# Patient Record
Sex: Female | Born: 1990 | Race: White | Hispanic: No | Marital: Married | Smoking: Former smoker
Health system: Southern US, Community
[De-identification: ages and names within clinical notes are randomized; demographics above are authoritative.]

## PROBLEM LIST (undated history)

## (undated) ENCOUNTER — Inpatient Hospital Stay: Payer: Self-pay

## (undated) DIAGNOSIS — F329 Major depressive disorder, single episode, unspecified: Secondary | ICD-10-CM

## (undated) DIAGNOSIS — F988 Other specified behavioral and emotional disorders with onset usually occurring in childhood and adolescence: Secondary | ICD-10-CM

## (undated) DIAGNOSIS — R7303 Prediabetes: Secondary | ICD-10-CM

## (undated) DIAGNOSIS — R112 Nausea with vomiting, unspecified: Secondary | ICD-10-CM

## (undated) DIAGNOSIS — F419 Anxiety disorder, unspecified: Secondary | ICD-10-CM

## (undated) DIAGNOSIS — J069 Acute upper respiratory infection, unspecified: Secondary | ICD-10-CM

## (undated) DIAGNOSIS — K911 Postgastric surgery syndromes: Secondary | ICD-10-CM

## (undated) DIAGNOSIS — K219 Gastro-esophageal reflux disease without esophagitis: Secondary | ICD-10-CM

## (undated) DIAGNOSIS — J45909 Unspecified asthma, uncomplicated: Secondary | ICD-10-CM

## (undated) DIAGNOSIS — F319 Bipolar disorder, unspecified: Secondary | ICD-10-CM

## (undated) DIAGNOSIS — F99 Mental disorder, not otherwise specified: Secondary | ICD-10-CM

## (undated) DIAGNOSIS — F32A Depression, unspecified: Secondary | ICD-10-CM

## (undated) DIAGNOSIS — Z9889 Other specified postprocedural states: Secondary | ICD-10-CM

## (undated) DIAGNOSIS — L309 Dermatitis, unspecified: Secondary | ICD-10-CM

## (undated) HISTORY — DX: Acute upper respiratory infection, unspecified: J06.9

## (undated) HISTORY — PX: SINUS SURGERY WITH INSTATRAK: SHX5215

## (undated) HISTORY — PX: SINOSCOPY: SHX187

## (undated) HISTORY — DX: Dermatitis, unspecified: L30.9

## (undated) HISTORY — PX: WISDOM TOOTH EXTRACTION: SHX21

---
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
## 2013-07-06 ENCOUNTER — Emergency Department: Payer: Self-pay | Admitting: Emergency Medicine

## 2013-07-06 LAB — DRUG SCREEN, URINE
Amphetamines, Ur Screen: NEGATIVE (ref ?–1000)
Barbiturates, Ur Screen: POSITIVE (ref ?–200)
Cocaine Metabolite,Ur ~~LOC~~: NEGATIVE (ref ?–300)
Methadone, Ur Screen: NEGATIVE (ref ?–300)
Opiate, Ur Screen: NEGATIVE (ref ?–300)
Tricyclic, Ur Screen: NEGATIVE (ref ?–1000)

## 2013-07-06 LAB — COMPREHENSIVE METABOLIC PANEL
Alkaline Phosphatase: 89 U/L (ref 50–136)
Anion Gap: 4 — ABNORMAL LOW (ref 7–16)
Bilirubin,Total: 0.2 mg/dL (ref 0.2–1.0)
Calcium, Total: 8.5 mg/dL (ref 8.5–10.1)
Chloride: 111 mmol/L — ABNORMAL HIGH (ref 98–107)
Creatinine: 1.17 mg/dL (ref 0.60–1.30)
EGFR (African American): >60
Glucose: 95 mg/dL (ref 65–99)
Osmolality: 282 (ref 275–301)
SGOT(AST): 20 U/L (ref 15–37)
SGPT (ALT): 21 U/L (ref 12–78)
Sodium: 141 mmol/L (ref 136–145)
Total Protein: 7.6 g/dL (ref 6.4–8.2)

## 2013-07-06 LAB — URINALYSIS, COMPLETE
Bacteria: NONE SEEN
Ketone: NEGATIVE
Protein: NEGATIVE
RBC,UR: 2 /HPF (ref 0–5)
Specific Gravity: 1.012 (ref 1.003–1.030)
Squamous Epithelial: 7

## 2013-07-06 LAB — CBC
MCHC: 32.3 g/dL (ref 32.0–36.0)
Platelet: 288 10*3/uL (ref 150–440)
RBC: 4.71 10*6/uL (ref 3.80–5.20)
WBC: 7.1 10*3/uL (ref 3.6–11.0)

## 2013-08-10 ENCOUNTER — Emergency Department: Payer: Self-pay | Admitting: Emergency Medicine

## 2013-08-10 LAB — BASIC METABOLIC PANEL
Anion Gap: 5 — ABNORMAL LOW (ref 7–16)
BUN: 8 mg/dL (ref 7–18)
Calcium, Total: 8.7 mg/dL (ref 8.5–10.1)
Chloride: 106 mmol/L (ref 98–107)
Co2: 28 mmol/L (ref 21–32)
Creatinine: 0.91 mg/dL (ref 0.60–1.30)
EGFR (Non-African Amer.): >60
Glucose: 113 mg/dL — ABNORMAL HIGH (ref 65–99)
Osmolality: 277 (ref 275–301)
Potassium: 3.3 mmol/L — ABNORMAL LOW (ref 3.5–5.1)
Sodium: 139 mmol/L (ref 136–145)

## 2013-08-10 LAB — CBC
HCT: 40.9 % (ref 35.0–47.0)
MCH: 27.7 pg (ref 26.0–34.0)
MCHC: 32.9 g/dL (ref 32.0–36.0)
Platelet: 215 10*3/uL (ref 150–440)
RBC: 4.87 10*6/uL (ref 3.80–5.20)
RDW: 16 % — ABNORMAL HIGH (ref 11.5–14.5)
WBC: 9.4 10*3/uL (ref 3.6–11.0)

## 2013-08-12 ENCOUNTER — Emergency Department: Payer: Self-pay | Admitting: Emergency Medicine

## 2013-08-19 ENCOUNTER — Emergency Department: Payer: Self-pay | Admitting: Emergency Medicine

## 2013-08-19 LAB — COMPREHENSIVE METABOLIC PANEL
Albumin: 3.5 g/dL (ref 3.4–5.0)
Alkaline Phosphatase: 90 U/L (ref 50–136)
Anion Gap: 3 — ABNORMAL LOW (ref 7–16)
BUN: 12 mg/dL (ref 7–18)
Bilirubin,Total: 0.2 mg/dL (ref 0.2–1.0)
Calcium, Total: 8.1 mg/dL — ABNORMAL LOW (ref 8.5–10.1)
Chloride: 107 mmol/L (ref 98–107)
Co2: 29 mmol/L (ref 21–32)
Creatinine: 1.14 mg/dL (ref 0.60–1.30)
EGFR (African American): >60
Glucose: 94 mg/dL (ref 65–99)
Potassium: 3.6 mmol/L (ref 3.5–5.1)
SGOT(AST): 24 U/L (ref 15–37)
Total Protein: 7.2 g/dL (ref 6.4–8.2)

## 2013-08-19 LAB — URINALYSIS, COMPLETE
Bacteria: NONE SEEN
Bilirubin,UR: NEGATIVE
Glucose,UR: NEGATIVE mg/dL (ref 0–75)
Ketone: NEGATIVE
Protein: NEGATIVE
RBC,UR: 1 /HPF (ref 0–5)
Specific Gravity: 1.019 (ref 1.003–1.030)
Squamous Epithelial: 2
WBC UR: 5 /HPF (ref 0–5)

## 2013-08-19 LAB — CBC
HCT: 38.7 % (ref 35.0–47.0)
HGB: 12.9 g/dL (ref 12.0–16.0)
MCH: 28.1 pg (ref 26.0–34.0)
MCHC: 33.3 g/dL (ref 32.0–36.0)
RDW: 15.7 % — ABNORMAL HIGH (ref 11.5–14.5)

## 2013-08-22 ENCOUNTER — Inpatient Hospital Stay: Payer: Self-pay | Admitting: Psychiatry

## 2013-08-22 LAB — PREGNANCY, URINE: Pregnancy Test, Urine: NEGATIVE m[IU]/mL

## 2013-08-22 LAB — URINALYSIS, COMPLETE
Bilirubin,UR: NEGATIVE
Blood: NEGATIVE
Glucose,UR: NEGATIVE mg/dL (ref 0–75)
Ketone: NEGATIVE
Nitrite: NEGATIVE
Protein: 30
RBC,UR: 1 /HPF (ref 0–5)
Specific Gravity: 1.01 (ref 1.003–1.030)
Squamous Epithelial: 8
WBC UR: 27 /HPF (ref 0–5)

## 2013-08-22 LAB — CBC
HCT: 39.4 % (ref 35.0–47.0)
MCHC: 32.7 g/dL (ref 32.0–36.0)
MCV: 86 fL (ref 80–100)
Platelet: 261 10*3/uL (ref 150–440)
RBC: 4.58 10*6/uL (ref 3.80–5.20)
RDW: 15.5 % — ABNORMAL HIGH (ref 11.5–14.5)
WBC: 9.6 10*3/uL (ref 3.6–11.0)

## 2013-08-22 LAB — COMPREHENSIVE METABOLIC PANEL
Albumin: 3.5 g/dL (ref 3.4–5.0)
Alkaline Phosphatase: 87 U/L (ref 50–136)
Anion Gap: 9 (ref 7–16)
BUN: 9 mg/dL (ref 7–18)
Calcium, Total: 8.9 mg/dL (ref 8.5–10.1)
Co2: 27 mmol/L (ref 21–32)
Creatinine: 1.24 mg/dL (ref 0.60–1.30)
EGFR (African American): >60
Potassium: 3.4 mmol/L — ABNORMAL LOW (ref 3.5–5.1)
SGPT (ALT): 34 U/L (ref 12–78)
Sodium: 144 mmol/L (ref 136–145)
Total Protein: 7.1 g/dL (ref 6.4–8.2)

## 2013-08-22 LAB — DRUG SCREEN, URINE
Barbiturates, Ur Screen: NEGATIVE (ref ?–200)
Benzodiazepine, Ur Scrn: NEGATIVE (ref ?–200)
Cannabinoid 50 Ng, Ur ~~LOC~~: NEGATIVE (ref ?–50)
Cocaine Metabolite,Ur ~~LOC~~: NEGATIVE (ref ?–300)
Methadone, Ur Screen: NEGATIVE (ref ?–300)
Opiate, Ur Screen: NEGATIVE (ref ?–300)
Phencyclidine (PCP) Ur S: NEGATIVE (ref ?–25)

## 2013-08-22 LAB — ETHANOL: Ethanol: <3 mg/dL

## 2013-10-11 ENCOUNTER — Emergency Department: Payer: Self-pay | Admitting: Emergency Medicine

## 2013-11-12 ENCOUNTER — Emergency Department: Payer: Self-pay | Admitting: Emergency Medicine

## 2013-11-12 LAB — URINALYSIS, COMPLETE
Bacteria: NONE SEEN
Bilirubin,UR: NEGATIVE
Blood: NEGATIVE
Glucose,UR: NEGATIVE mg/dL (ref 0–75)
Nitrite: NEGATIVE
Ph: 8 (ref 4.5–8.0)
Specific Gravity: 1.006 (ref 1.003–1.030)

## 2013-11-12 LAB — RAPID INFLUENZA A&B ANTIGENS

## 2013-11-25 ENCOUNTER — Emergency Department: Payer: Self-pay | Admitting: Internal Medicine

## 2013-11-25 LAB — CBC
MCH: 28.6 pg (ref 26.0–34.0)
MCHC: 33.4 g/dL (ref 32.0–36.0)
MCV: 86 fL (ref 80–100)
RBC: 5.34 10*6/uL — ABNORMAL HIGH (ref 3.80–5.20)
WBC: 11.4 10*3/uL — ABNORMAL HIGH (ref 3.6–11.0)

## 2013-11-25 LAB — BASIC METABOLIC PANEL
Anion Gap: 5 — ABNORMAL LOW (ref 7–16)
BUN: 12 mg/dL (ref 7–18)
Calcium, Total: 9.7 mg/dL (ref 8.5–10.1)
Co2: 28 mmol/L (ref 21–32)
Creatinine: 0.85 mg/dL (ref 0.60–1.30)
EGFR (African American): >60
EGFR (Non-African Amer.): >60
Osmolality: 279 (ref 275–301)

## 2013-11-25 LAB — URINALYSIS, COMPLETE
Bilirubin,UR: NEGATIVE
Glucose,UR: NEGATIVE mg/dL (ref 0–75)
Ketone: NEGATIVE
Protein: NEGATIVE
Specific Gravity: 1.027 (ref 1.003–1.030)

## 2013-11-25 LAB — GC/CHLAMYDIA PROBE AMP

## 2013-11-25 LAB — HCG, QUANTITATIVE, PREGNANCY: Beta Hcg, Quant.: <1 m[IU]/mL — ABNORMAL LOW

## 2014-01-25 ENCOUNTER — Emergency Department: Payer: Self-pay | Admitting: Emergency Medicine

## 2014-01-25 LAB — BASIC METABOLIC PANEL
Anion Gap: 6 — ABNORMAL LOW (ref 7–16)
BUN: 14 mg/dL (ref 7–18)
CHLORIDE: 105 mmol/L (ref 98–107)
Calcium, Total: 8.8 mg/dL (ref 8.5–10.1)
Co2: 26 mmol/L (ref 21–32)
Creatinine: 0.84 mg/dL (ref 0.60–1.30)
EGFR (Non-African Amer.): >60
Glucose: 93 mg/dL (ref 65–99)
OSMOLALITY: 274 (ref 275–301)
POTASSIUM: 3.5 mmol/L (ref 3.5–5.1)
Sodium: 137 mmol/L (ref 136–145)

## 2014-01-25 LAB — CBC WITH DIFFERENTIAL/PLATELET
BASOS ABS: 0 10*3/uL (ref 0.0–0.1)
Basophil %: 0.4 %
EOS PCT: 0.5 %
Eosinophil #: 0.1 10*3/uL (ref 0.0–0.7)
HCT: 42.7 % (ref 35.0–47.0)
HGB: 14 g/dL (ref 12.0–16.0)
LYMPHS ABS: 3.1 10*3/uL (ref 1.0–3.6)
LYMPHS PCT: 26.5 %
MCH: 29 pg (ref 26.0–34.0)
MCHC: 32.8 g/dL (ref 32.0–36.0)
MCV: 88 fL (ref 80–100)
MONO ABS: 0.9 x10 3/mm (ref 0.2–0.9)
Monocyte %: 7.3 %
NEUTROS ABS: 7.7 10*3/uL — AB (ref 1.4–6.5)
NEUTROS PCT: 65.3 %
Platelet: 228 10*3/uL (ref 150–440)
RBC: 4.83 10*6/uL (ref 3.80–5.20)
RDW: 13.4 % (ref 11.5–14.5)
WBC: 11.8 10*3/uL — AB (ref 3.6–11.0)

## 2014-01-27 ENCOUNTER — Observation Stay: Payer: Self-pay | Admitting: Internal Medicine

## 2014-01-27 LAB — DRUG SCREEN, URINE

## 2014-01-27 LAB — CBC
HCT: 39.4 % (ref 35.0–47.0)
HGB: 13.2 g/dL (ref 12.0–16.0)
MCH: 29.6 pg (ref 26.0–34.0)
MCHC: 33.4 g/dL (ref 32.0–36.0)
MCV: 89 fL (ref 80–100)
Platelet: 210 10*3/uL (ref 150–440)
RBC: 4.45 10*6/uL (ref 3.80–5.20)
RDW: 13.5 % (ref 11.5–14.5)
WBC: 11.5 10*3/uL — ABNORMAL HIGH (ref 3.6–11.0)

## 2014-01-27 LAB — BASIC METABOLIC PANEL
ANION GAP: 6 — AB (ref 7–16)
BUN: 15 mg/dL (ref 7–18)
CALCIUM: 8.8 mg/dL (ref 8.5–10.1)
CHLORIDE: 105 mmol/L (ref 98–107)
CREATININE: 0.79 mg/dL (ref 0.60–1.30)
Co2: 28 mmol/L (ref 21–32)
EGFR (African American): >60
EGFR (Non-African Amer.): >60
GLUCOSE: 95 mg/dL (ref 65–99)
Osmolality: 278 (ref 275–301)
Potassium: 3.5 mmol/L (ref 3.5–5.1)
Sodium: 139 mmol/L (ref 136–145)

## 2014-01-28 LAB — URINALYSIS, COMPLETE
Bilirubin,UR: NEGATIVE
Blood: NEGATIVE
GLUCOSE, UR: NEGATIVE mg/dL (ref 0–75)
Nitrite: NEGATIVE
PH: 5 (ref 4.5–8.0)
PROTEIN: NEGATIVE
RBC,UR: 18 /HPF (ref 0–5)
Specific Gravity: 1.031 (ref 1.003–1.030)
Squamous Epithelial: 7
WBC UR: 93 /HPF (ref 0–5)

## 2014-01-28 LAB — WBC: WBC: 8.9 10*3/uL (ref 3.6–11.0)

## 2014-01-28 LAB — LIPID PANEL
CHOLESTEROL: 118 mg/dL (ref 0–200)
HDL: 30 mg/dL — AB (ref 40–60)
LDL CHOLESTEROL, CALC: 76 mg/dL (ref 0–100)
Triglycerides: 59 mg/dL (ref 0–200)
VLDL CHOLESTEROL, CALC: 12 mg/dL (ref 5–40)

## 2014-05-03 DIAGNOSIS — N898 Other specified noninflammatory disorders of vagina: Secondary | ICD-10-CM | POA: Insufficient documentation

## 2014-05-03 DIAGNOSIS — Z30432 Encounter for removal of intrauterine contraceptive device: Secondary | ICD-10-CM | POA: Insufficient documentation

## 2014-05-03 DIAGNOSIS — Z30011 Encounter for initial prescription of contraceptive pills: Secondary | ICD-10-CM | POA: Insufficient documentation

## 2014-05-20 ENCOUNTER — Emergency Department: Payer: Self-pay | Admitting: Emergency Medicine

## 2014-05-20 LAB — COMPREHENSIVE METABOLIC PANEL
ALK PHOS: 58 U/L
ANION GAP: 5 — AB (ref 7–16)
AST: 32 U/L (ref 15–37)
Albumin: 3.7 g/dL (ref 3.4–5.0)
BUN: 13 mg/dL (ref 7–18)
Bilirubin,Total: 0.4 mg/dL (ref 0.2–1.0)
CALCIUM: 8.5 mg/dL (ref 8.5–10.1)
CHLORIDE: 106 mmol/L (ref 98–107)
CO2: 28 mmol/L (ref 21–32)
CREATININE: 0.92 mg/dL (ref 0.60–1.30)
EGFR (African American): >60
EGFR (Non-African Amer.): >60
Glucose: 87 mg/dL (ref 65–99)
OSMOLALITY: 277 (ref 275–301)
POTASSIUM: 3.5 mmol/L (ref 3.5–5.1)
SGPT (ALT): 43 U/L (ref 12–78)
Sodium: 139 mmol/L (ref 136–145)
Total Protein: 7.7 g/dL (ref 6.4–8.2)

## 2014-05-20 LAB — CBC
HCT: 40.2 % (ref 35.0–47.0)
HGB: 13.8 g/dL (ref 12.0–16.0)
MCH: 30.8 pg (ref 26.0–34.0)
MCHC: 34.3 g/dL (ref 32.0–36.0)
MCV: 90 fL (ref 80–100)
Platelet: 206 10*3/uL (ref 150–440)
RBC: 4.47 10*6/uL (ref 3.80–5.20)
RDW: 12.7 % (ref 11.5–14.5)
WBC: 6.8 10*3/uL (ref 3.6–11.0)

## 2014-05-20 LAB — URINALYSIS, COMPLETE
BILIRUBIN, UR: NEGATIVE
Glucose,UR: NEGATIVE mg/dL (ref 0–75)
KETONE: NEGATIVE
Nitrite: NEGATIVE
PROTEIN: NEGATIVE
Ph: 6 (ref 4.5–8.0)
RBC,UR: 2 /HPF (ref 0–5)
Specific Gravity: 1.006 (ref 1.003–1.030)
WBC UR: 5 /HPF (ref 0–5)

## 2014-05-20 LAB — LIPASE, BLOOD: Lipase: 95 U/L (ref 73–393)

## 2014-05-20 LAB — WET PREP, GENITAL

## 2014-05-20 LAB — PREGNANCY, URINE: Pregnancy Test, Urine: NEGATIVE m[IU]/mL

## 2014-05-20 LAB — GC/CHLAMYDIA PROBE AMP

## 2014-05-21 LAB — URINE CULTURE

## 2014-05-31 ENCOUNTER — Emergency Department: Payer: Self-pay | Admitting: Emergency Medicine

## 2014-05-31 LAB — CBC
HCT: 45.3 % (ref 35.0–47.0)
HGB: 15.5 g/dL (ref 12.0–16.0)
MCH: 30.6 pg (ref 26.0–34.0)
MCHC: 34.2 g/dL (ref 32.0–36.0)
MCV: 90 fL (ref 80–100)
Platelet: 206 10*3/uL (ref 150–440)
RBC: 5.05 10*6/uL (ref 3.80–5.20)
RDW: 12.5 % (ref 11.5–14.5)
WBC: 9.3 10*3/uL (ref 3.6–11.0)

## 2014-05-31 LAB — COMPREHENSIVE METABOLIC PANEL
ALT: 32 U/L (ref 12–78)
ANION GAP: 5 — AB (ref 7–16)
AST: 31 U/L (ref 15–37)
Albumin: 3.6 g/dL (ref 3.4–5.0)
Alkaline Phosphatase: 65 U/L
BILIRUBIN TOTAL: 0.9 mg/dL (ref 0.2–1.0)
BUN: 12 mg/dL (ref 7–18)
Calcium, Total: 8.1 mg/dL — ABNORMAL LOW (ref 8.5–10.1)
Chloride: 106 mmol/L (ref 98–107)
Co2: 27 mmol/L (ref 21–32)
Creatinine: 0.96 mg/dL (ref 0.60–1.30)
EGFR (African American): >60
Glucose: 101 mg/dL — ABNORMAL HIGH (ref 65–99)
Osmolality: 276 (ref 275–301)
Potassium: 3.6 mmol/L (ref 3.5–5.1)
Sodium: 138 mmol/L (ref 136–145)
Total Protein: 7.4 g/dL (ref 6.4–8.2)

## 2014-05-31 LAB — LIPASE, BLOOD: LIPASE: 100 U/L (ref 73–393)

## 2014-05-31 LAB — URINALYSIS, COMPLETE
Bilirubin,UR: NEGATIVE
Blood: NEGATIVE
GLUCOSE, UR: NEGATIVE mg/dL (ref 0–75)
Ketone: NEGATIVE
Nitrite: NEGATIVE
Ph: 7 (ref 4.5–8.0)
Protein: NEGATIVE
RBC,UR: 1 /HPF (ref 0–5)
SPECIFIC GRAVITY: 1.018 (ref 1.003–1.030)
WBC UR: 9 /HPF (ref 0–5)

## 2014-09-22 ENCOUNTER — Emergency Department: Payer: Self-pay | Admitting: Emergency Medicine

## 2014-09-30 ENCOUNTER — Emergency Department: Payer: Self-pay | Admitting: Emergency Medicine

## 2014-11-25 ENCOUNTER — Emergency Department: Payer: Self-pay | Admitting: Emergency Medicine

## 2014-11-25 LAB — CBC WITH DIFFERENTIAL/PLATELET
Basophil #: 0.1 10*3/uL (ref 0.0–0.1)
Basophil %: 0.4 %
Eosinophil #: 0.1 10*3/uL (ref 0.0–0.7)
Eosinophil %: 0.6 %
HCT: 42.4 % (ref 35.0–47.0)
HGB: 13.5 g/dL (ref 12.0–16.0)
LYMPHS PCT: 29.1 %
Lymphocyte #: 3.6 10*3/uL (ref 1.0–3.6)
MCH: 29.1 pg (ref 26.0–34.0)
MCHC: 31.9 g/dL — ABNORMAL LOW (ref 32.0–36.0)
MCV: 91 fL (ref 80–100)
Monocyte #: 0.9 x10 3/mm (ref 0.2–0.9)
Monocyte %: 7 %
Neutrophil #: 7.9 10*3/uL — ABNORMAL HIGH (ref 1.4–6.5)
Neutrophil %: 62.9 %
Platelet: 311 10*3/uL (ref 150–440)
RBC: 4.65 10*6/uL (ref 3.80–5.20)
RDW: 12.7 % (ref 11.5–14.5)
WBC: 12.5 10*3/uL — ABNORMAL HIGH (ref 3.6–11.0)

## 2014-11-25 LAB — BASIC METABOLIC PANEL
Anion Gap: 7 (ref 7–16)
BUN: 15 mg/dL (ref 7–18)
CHLORIDE: 103 mmol/L (ref 98–107)
Calcium, Total: 8.4 mg/dL — ABNORMAL LOW (ref 8.5–10.1)
Co2: 30 mmol/L (ref 21–32)
Creatinine: 1.13 mg/dL (ref 0.60–1.30)
EGFR (African American): >60
EGFR (Non-African Amer.): >60
GLUCOSE: 100 mg/dL — AB (ref 65–99)
OSMOLALITY: 280 (ref 275–301)
POTASSIUM: 3.5 mmol/L (ref 3.5–5.1)
Sodium: 140 mmol/L (ref 136–145)

## 2014-11-25 LAB — URINALYSIS, COMPLETE
Bilirubin,UR: NEGATIVE
GLUCOSE, UR: NEGATIVE mg/dL (ref 0–75)
KETONE: NEGATIVE
LEUKOCYTE ESTERASE: NEGATIVE
Nitrite: NEGATIVE
PROTEIN: NEGATIVE
Ph: 5 (ref 4.5–8.0)
RBC,UR: 67 /HPF (ref 0–5)
Specific Gravity: 1.023 (ref 1.003–1.030)
WBC UR: NONE SEEN /HPF (ref 0–5)
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
## 2015-04-07 NOTE — Consult Note (Signed)
PATIENT NAME:  Holly Wagner, Holly Wagner MR#:  [id number] DATE OF BIRTH:  Sep 18, 1991  DATE OF CONSULTATION:  08/22/2013  REFERRING PHYSICIAN:   CONSULTING PHYSICIAN:  Nasean Zapf K. Porcha Deblanc, MD  SUBJECTIVE:  The patient is seen in consultation at Room No. 32.  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  The patient has a boyfriend who is 59 years old and works at a warehouse.  The patient comes to the [facility] Emergency Room after having taken Topamax 20 tablets and Wellbutrin 6 tablets in an OD attempt after she had an argument with the boyfriend.  She reported that she just wanted to get out of the situation and did not mean to hurt herself.    PAST PSYCHIATRIC HISTORY:  History of inpatient psychiatry 3 times so far.  Was inpatient at [facility] twice and once at [place] period of inpatient hospitalization was for 7 days.  Last inpatient hospitalization was at [facility] at age 33 years.  All 3 times she was admitted after she overdosed on prescription medications.  Being followed by Dr. Carollee Herter at [facility], last appointment 2 months ago, next appointment is to be made in the next few weeks.    ALCOHOL AND DRUGS:  Has an occasional drink of alcohol. No problem with alcohol drinking.  Denies street or prescription drug abuse.  Does admit to smoking nicotine cigarettes occasionally, not on a regular basis.    MENTAL STATUS EXAM:  The patient is seen sitting on the bed on the floor, alert and oriented, aware of the situation that brought her here.  Affect is appropriate with her mood.  Admits feeling low and down, and having had argument with the boyfriend, but got back with him.  Denies feeling hopeless, helpless.  Denies feeling worthless, useless.  Denies suicidal or homicidal plans and stated that she wanted to get out of the situation, did not mean to hurt herself, and she wants to live and get help.   No psychosis.  Denies auditory or visual hallucinations.  Cognition is intact.  Memory is intact.  General knowledge and information is fair.  Contracts for safety and wants to go home.  Insight and judgment guarded.    IMPRESSION:  History of bipolar disorder, depressed with overdose on medication because of poor impulse control.  PLAN:  Continue current medications and watch.  The patient is to be evaluated by Mr. Sharen Hones on 08/23/2013 in a.m. for appropriate disposition and probably discharge with followup appointment.    ____________________________ Wallace Cullens. Franchot Mimes, MD skc:cs D: 08/22/2013 18:44:00 ET T: 08/22/2013 19:29:01 ET JOB#: [id number]  cc: Arlyn Leak K. Franchot Mimes, MD, <Dictator> Dewain Penning MD ELECTRONICALLY SIGNED 08/28/2013 15:56

## 2015-04-07 NOTE — Discharge Summary (Signed)
PATIENT NAME:  Holly Wagner, Holly Wagner MR#:  [id number] DATE OF BIRTH:  1991-10-27  DATE OF ADMISSION:  08/22/2013 DATE OF DISCHARGE:  08/24/2013  HOSPITAL COURSE: See dictated history and physical for details of admission. This 24 year old woman with a history of mood instability came to the hospital after taking an impulsive overdose of prescription medicines, specifically Topamax and Wellbutrin. The patient stated to me that she did not actually want to die but just wanted to "go to sleep." She admits that she was not thinking clearly and was very emotional when she took the overdose. Immediately afterwards she told her boyfriend and was cooperative with coming to the hospital and getting treatment. In the hospital, she has not repeated any suicidal behavior and has denied suicidal ideation. Her affect and mood have been largely upbeat and cheerful and appropriate. She has participated appropriately in groups and individual counseling. I reviewed her previous medication, but as the patient told me that she had been off of her medicine for several weeks I elected not to restart her on anything. She was not clear which if any of the medicines had really been helpful for her and given the high side effect burden that would be potentially there I preferred to let her follow up with her outpatient doctor. She was completely agreeable to this. There was no sign of any psychotic symptoms. The patient has a diagnosis of bipolar disorder, but she strikes me probably as having more of a posttraumatic stress disorder with possible borderline personality disorder as well. The patient is no longer voicing any suicidal ideation and is able to report positive things in her life that are valuable to her that she wants to live for, especially her relationship with her son and with her family and with her boyfriend. Short-term dangerousness appears to be relatively low. She will be discharged today and has a followup appointment with  her usual provider who is Carollee Herter at [facility] in [place]. The patient will be started on Septra as treatment for urinary tract infection at discharge.  DISCHARGE MEDICATIONS: Septra DS 1 by mouth twice a day for 5 days.  LABORATORY RESULTS: Admission labs showed a drug screen that was negative, TSH normal at 0.7, alcohol undetected and CBC unremarkable. Urinalysis however does show positive leukocyte esterase and white blood cells, probably indicating a urinary tract infection.   MENTAL STATUS EXAMINATION AT DISCHARGE: Neatly dressed and groomed woman, looks her stated age. Cooperative with the interview. Good eye contact, normal psychomotor activity. Speech normal rate, tone and volume. Affect euthymic, reactive and appropriate. Mood stated as being good. Thoughts are lucid without any loosening of associations or delusions. Denies auditory or visual hallucinations. Denies suicidal or homicidal ideation. Shows improved insight and judgment. Normal intelligence. Alert and oriented x 4. Denies any suicidality.   DISPOSITION: Discharge home. Follow up with [facility] with her usual provider.   DIAGNOSIS, PRINCIPAL AND PRIMARY:  AXIS I: Depression, not otherwise specified.   SECONDARY DIAGNOSES: AXIS I: Posttraumatic stress disorder.  AXIS II: Borderline personality disorder.  AXIS III: Urinary tract infection.  AXIS IV: Moderate from chronic stress of her not having a job and minimal resources.  AXIS V: Functioning at time of discharge 60. ____________________________ Gonzella Lex, MD jtc:sb D: 08/24/2013 11:36:02 ET T: 08/24/2013 11:59:42 ET JOB#: [id number]  cc: Gonzella Lex, MD, <Dictator> Gonzella Lex MD ELECTRONICALLY SIGNED 08/24/2013 16:42

## 2015-04-08 NOTE — H&P (Signed)
PATIENT NAME:  Holly Wagner, Holly Wagner MR#:  [id number] DATE OF BIRTH:  08/06/1991  DATE OF ADMISSION:  01/27/2014  REFERRING PHYSICIAN: Dr. Reita Cliche.   PRIMARY CARE PHYSICIAN: Dr. Ricard Dillon.   CHIEF COMPLAINT: Left-sided weakness.   HISTORY OF PRESENT ILLNESS: This is a 24 year old Caucasian female with past medical history of bipolar disorder, anxiety and depression not otherwise specified who is presenting with left-sided weakness. She describes a 2 day duration of progressively worsening left-sided weakness which originally started as left lower extremity paresthesias. It has subsequently moved proximal to include her left upper extremity and face. She was actually evaluated at [facility] 1 day prior to arrival. At that time, she was evaluated by telemedicine neurology who suggested she undergo an MRI of head and neck with an MRI with contrast. She, however, left AMA prior to these tests being performed. She was then coming back with worsening of symptoms of the above complaint. Currently in the Emergency Department, she is complaining only of nauseousness. Otherwise without complaints.   REVIEW OF SYSTEMS:   CONSTITUTIONAL: Denies fever, fatigue. Positive for weakness as described above.  EYES: Denies blurry vision, double vision or eye pain.  EARS, NOSE, THROAT: Denies tinnitus, ear pain, hearing loss.  RESPIRATORY: Denies cough, wheeze or shortness of breath.  CARDIOVASCULAR: Denies chest pain, palpitations, edema.  GASTROINTESTINAL: Denies nausea, vomiting, diarrhea, abdominal pain.  GENITOURINARY: Denies dysuria or hematuria.  ENDOCRINE: Denies nocturia or thyroid problems.  HEMATOLOGIC AND LYMPHATIC: Denies easy bruising or bleeding.  SKIN: Denies rash or lesions.  MUSCULOSKELETAL: Denies pain in neck, back, shoulder, knees, hips or arthritic symptoms.  NEUROLOGIC: Positive for paresthesias and weakness as described above; however, denies any dysarthria, tremors, vertigo, ataxia or headaches.   PSYCHIATRIC: Denies any current anxiety or depressive symptoms.   Otherwise, full review of systems performed by me is negative.   PAST MEDICAL HISTORY: Bipolar disorder as well as asthma which is well controlled.   SOCIAL HISTORY: Every day tobacco abuse. Denies any drug or alcohol usage.   FAMILY HISTORY: Positive for psychiatric disorders, including bipolar disorder. She also states that she has a cousin who had strokes at an early onset age in 41s; however, she does know further details of this.   ALLERGIES: No known drug allergies.   HOME MEDICATIONS: Include Ativan 0.5 mg p.o. b.i.d. as needed for anxiety, Brintellix 5 mg p.o. at bedtime, Saphris 5 mg sublingual at bedtime.   PHYSICAL EXAMINATION:  VITAL SIGNS: Temperature 98.1, heart rate 99, respirations 18, blood pressure 119/87, saturating 97% on room air. Weight 81.6 kg, BMI 29.1.  GENERAL: Well-nourished, well-developed, Caucasian female, currently in no acute distress.  HEAD: Normocephalic, atraumatic.  EYES: Pupils equal, round and reactive to light. Extraocular muscles intact. No scleral icterus.  MOUTH: Moist mucosal membranes. Dentition intact. No abscess noted.  EARS, NOSE, THROAT: Throat clear without exudates. No external lesions.  NECK: Supple. No thyromegaly. No nodules. No JVD.  PULMONARY: Clear to auscultation bilaterally without wheezes, rubs, rhonchi. No use of accessory muscles. Good respiratory rate. CHEST: Nontender to palpable.   CARDIOVASCULAR: S1, S2, regular rate and rhythm. No murmurs, rubs or gallops. No edema. Pedal pulses 2+ bilaterally.  GASTROINTESTINAL: Soft, nontender, nondistended. No masses. Positive bowel sounds. No hepatosplenomegaly.  MUSCULOSKELETAL: No swelling, clubbing or edema. Range of motion is full in all extremities passively. Active range of motion limited on the left side secondary to weakness.  NEUROLOGIC: Cranial nerves II through XII grossly intact. Sensation is greatly  diminished over the  entirety of the left side, including face, upper extremity, torso and lower extremity. Reflexes, however, are intact. Strength 4 out of 5 in right upper and lower extremities. Strength 3 out of 5 left hand grip, 3 out of 5 in proximal and distal flexors and extensors of left upper extremity. Lower extremity much the same. Left lower extremity strength is 4- out of 5 in proximal and distal flexors and extensors in comparison to the right of 5 out of 5. Dysdiadochokinesis markedly abnormal of the left upper extremity when compared to the right; however, finger-to-nose reveals no deficits. Pronator drift well within normal limits bilaterally.  SKIN: No ulcerations, lesions, rash, cyanosis. Does have a multitude of tattoos. Skin warm, dry. Turgor intact.  PSYCHIATRIC: Mood and affect within normal limits. The patient is awake, alert and oriented x 3. Insight and judgment intact.   LABORATORY DATA: Sodium 139, potassium 3.5, chloride 105, bicarb 28, BUN 15, creatinine 0.79, glucose 95. Urine drug screen within normal limits. WBC 11.5, hemoglobin 13.2, platelets 210. CT of the head performed revealing no acute intracranial process. She had an MRI performed February 10th which was essentially unremarkable.   ASSESSMENT AND PLAN: A 24 year old Caucasian female with past medical history of bipolar disorder presenting with slowly progressive left-sided weakness for a total of 2 days' duration.  1. Left-sided weakness: She has been evaluated by telemedicine neurology; however, she left against medical advice prior to the completion of her testing. She received an MRI which was within normal limits as well as a CAT scan. She received aspirin therapy. Will continue aspirin. Start statin therapy. As per recommendations by telemedicine neurology, she will receive an MRI of the brain with contrast as well as MRA of the head and neck, mainly looking for demyelinating disorders or secondary sources not  necessarily seen on MRI, as MRA as quoted by them can miss approximately 5% of all lesions. However, given inconsistency of physical exam I doubt that there is a neurological disorder as the etiology 2. Leukocytosis of 11.5 without any evidence of active infection. No indication for antibiotics at this time.  3. Bipolar disorder: Continue home medications of Brintellix and Saphris.  4. Venous thromboembolism prophylaxis with sequential compression devices.   The patient is FULL CODE.   TIME SPENT: 45 minutes.   ____________________________ Aaron Mose. Lataja Newland, MD dkh:gb D: 01/27/2014 04:12:07 ET T: 01/27/2014 04:35:12 ET JOB#: [id number]  cc: Aaron Mose. Carleta Woodrow, MD, <Dictator> Daevon Holdren Woodfin Ganja MD ELECTRONICALLY SIGNED 01/27/2014 20:39

## 2015-04-08 NOTE — Discharge Summary (Signed)
PATIENT NAME:  Holly Wagner, Holly Wagner MR#:  [id number] DATE OF BIRTH:  1991/02/03  ADMITTING DIAGNOSIS:  Left-sided weakness.  DISCHARGE DIAGNOSES:  1. Left-sided weakness with negative brain MRI/MRA for stroke, likely psychologic reaction to stress.    2.  Leukocytosis, resolved.  3.  Pyuria, concerning for urinary tract infection; cultures pending. 4.  History of asthma. 5.  Bipolar disorder. 6.  Tobacco abuse.   DISCHARGE CONDITION: Stable.   DISCHARGE MEDICATIONS: The patient is to continue Brintellix 5 mg p.o. at bedtime, Saphris 5 mg sublingually at bedtime, Ativan 0.5 mg twice daily as needed, and new medication sulfamethoxazole/trimethoprim which is double strength, 1 tablet every 12 hours for 3 more days.   DISCHARGE INSTRUCTIONS:  Home oxygen: None.  Diet consistency: Regular.  Activity limitations: As tolerated.  Followup: Appointment with Dr. Ricard Dillon in 2 days after discharge.    CONSULTANT:  Dr.  Gurney Maxin  RADIOLOGIC STUDIES: CT of head without contrast, 01/27/2014, was negative for head CT. MRI of brain with and without contrast, as well as MRA of brain without contrast, 01/27/2014, showed negative brain MRI, no acute infarct, normal MRA of the head and the neck. MRA of neck with and without contrast was also performed at the same day, 01/27/2014, and was normal.   REASON FOR ADMISSION:  The patient is a 24 year old Caucasian female with past medical history significant for history of bipolar disorder, history of asthma and tobacco abuse, who presented to the hospital with left-sided weakness. Please refer to Dr. Samantha Crimes admission note on  01/27/2014. Apparently the patient started having weakness on the left side of the body. This weakness was progressively worsening and originally started as lower extremity paresthesias, but then subsequently moved proximal and included left upper extremity and face. She was evaluated at [facility] one day prior to coming to the Emergency  Room one more time, and had MRI of neck and head done with contrast, however, left AMA and came back on 01/27/2014 with worsening symptoms.   VITAL SIGNS: On admission to the Emergency Room, temperature was 98.1, heart rate was 99, respiration rate was 18, blood pressure 119/87, saturation was 97% on room air.   PHYSICAL EXAMINATION: Remarkable for normal cranial nerves. Sensation was diminished entirely on the left side, including face, upper extremity as well as lower extremity. Reflexes were intact. Strength was 4 out of 5 in the right upper extremity and lower extremity. Strength was 3 out of 5 in left hand grip, and 3 out of 5 in proximal and distal flexors and extensors of left upper extremity and left lower extremity.   Her lab data done in the Emergency Room was unremarkable. The patient's BMP was normal. Urine drug screen was negative. CBC: White blood cell count was mildly elevated to 11.5, hemoglobin was 13.2 and platelet count was 210. The patient did have urinalysis done, which showed 18 red blood cells, 93 white blood cells, 1+ bacteria, 2+ leukocyte esterase, mucous as well as amorphous crystals were present concerning for possible pyuria, concerning for possible urinary tract infection.   HOSPITAL COURSE: The patient was admitted to the hospital for further evaluation. Consultation with a neurologist was obtained as well as by repeated MRI and MRA studies. Dr. Melrose Nakayama saw the patient in consultation on 01/27/2014, felt that given her age, history of multiple psychiatric conditions, lack of concern for the seemingly major deficits seen only a few hours earlier, and normal lab tests and imaging results, this event was likely function of psychogenic  in etiology. No signs of stroke and he did not think it was TIA. No history to raise concern for seizure or postictal paralysis. No additional neurologic workup was recommended, and Dr. Melrose Nakayama recommended close outpatient psychiatry followup. The  patient was advised to follow up with her primary psychiatrist, for which she was agreeable. She is being discharged home on 01/28/2014.   Her vital signs on day of discharge: Temperature was 98, pulse was 72, respiration rate was 16 to 20, blood pressure ranging from 97 systolic to 334 systolic and 35W diastolic. O2 sats were 98% on room air at rest. By 01/28/2014, her deficit was completely gone and she was able to ambulate with no significant discomfort. In regards to pyuria, it was concerning for possible urinary tract infection. The patient is to continue antibiotic therapy for a few more days to complete course.   TIME SPENT: 40 minutes.    ____________________________ Theodoro Grist, MD rv:mr D: 01/30/2014 17:44:00 ET T: 01/30/2014 18:49:47 ET JOB#: [id number]  cc: Theodoro Grist, MD, <Dictator> Jaynie Bream, PA-C          Deaven Barron MD ELECTRONICALLY SIGNED 02/15/2014 21:45

## 2015-04-08 NOTE — Consult Note (Signed)
Referring Physician:  Lytle Butte :   Primary Care Physician:  Jaynie Bream :   Reason for Consult: Admit Date: 27-Jan-2014  Chief Complaint: left sided weakness  Reason for Consult: weakness   History of Present Illness: History of Present Illness:   24 year old woman with a history of anxiety, depression and bipolar disorder presents with left sided weakness.  Patient reported that the weakness worened over about 2 days or so.  Started with LLE paresthesias and then spread proximally and into her LUE.  She had been seen earlier in the course of this episode in the ED and telemedicien neurology recommended MRI or Brain and Neck but she left AMA prior to these tests being performed.  At the time of my assessmetn this evening,t he patient says she has no weakness in the LUE or LLE.  Says she is not sure what caused it.  Says it gradually came back.  Describes no sesnory deficits either.  Asks when she will be permitted to be discharged.  Eating and drinking with both hands, good fine motor control in watching her. MEDICAL HISTORY: Bipolar disorder as well as asthma which is well controlled.  HISTORY: Positive for psychiatric disorders, including bipolar disorder. She also states that she has a cousin who had strokes at an early onset age in 60s; however, she does know further details of this.  HISTORY: Every day tobacco abuse. Denies any drug or alcohol usage.   HOME MEDICATIONS: Include Ativan 0.5 mg p.o. b.i.d. as needed for anxiety, Brintellix 5 mg p.o. at bedtime, Saphris 5 mg sublingual at bedtime.  No known drug allergies.     ROS:  General denies complaints   HEENT no complaints   Lungs no complaints   Cardiac no complaints   GI no complaints   GU no complaints   Musculoskeletal no complaints   Extremities no complaints   Skin no complaints   Endocrine no complaints   Psych no complaints   Past Medical/Surgical Hx:  essential tremor:   bipolar:   asthma:    Cholecystectomy:   Fatty tumor removal out of back:   Home Medications: Medication Instructions Last Modified Date/Time  Brintellix 5 mg oral tablet 1 tab(s) orally once a day (at bedtime) 12-Feb-15 04:55  Saphris 5 mg sublingual tablet 1 tab(s) sublingual once a day (at bedtime) 12-Feb-15 04:55  Ativan 0.5 mg oral tablet 1 tab(s) orally 2 times a day, As Needed for anxiety 12-Feb-15 04:55   [facility] Current Meds:  Acetaminophen * tablet, ( Tylenol (325 mg) tablet)  650 mg Oral q4h PRN for pain or temp. greater than 100.4  - Indication: Pain/Fever  Aspirin Enteric Coated tablet, ( Ecotrin)  81 mg Oral daily  - Indication: Pain/Fever/Thromboembolic Disorders/Post MI/Prophylaxis MI  Instructions:  Initiate Bleeding Precautions Protocol--DO NOT CRUSH  Ondansetron injection, ( Zofran injection )  4 mg, IV push, q4h PRN for Nausea/Vomiting  Indication: Nausea/ Vomiting  Asenapine tablet, ( Saphris tablet)  5 mg Sublingual at bedtime  LORazepam tablet, ( Ativan)  0.5 mg Oral bid PRN for anxiety  - Indication: Anxiety/ Seizure/ Antiemetic Adjunct/ Preop Sedation  Non-Formulary Medication, Brintellix 5 mg oral tablet  1 tablet(s) Oral daily  Nursing Saline Flush, 3 to 6 ml, IV push, Q1M PRN for IV Maintenance  Influenza Virus Quadrivalent Vaccine injection, 0.5 ml, Intramuscular, GivenOnce  Indication: provide Active Immunity to Influenza Strains contained in Vaccine, ***The patient must have a temperature of 100.5 or less,  anything greater the patient needs to be afebrile x 24 hours before administration***, **Latex Free**  Allergies:  No Known Allergies:   Vital Signs: **Vital Signs.:   12-Feb-15 08:28  Vital Signs Type Routine  Temperature Temperature (F) 97.7  Celsius 36.5  Temperature Source oral  Pulse Pulse 91  Respirations Respirations 18  Systolic BP Systolic BP 867  Diastolic BP (mmHg) Diastolic BP (mmHg) 73  Mean BP 85  Pulse Ox % Pulse Ox % 99  Pulse Ox Activity  Level  At rest  Oxygen Delivery Room Air/ 21 %   EXAM: GENERAL: Pleasant woman.  Sitting up eating dinner.  NAD.  Normocephalic and atraumatic.  EYES: Funduscopic exam shows normal disc size, appearance and C/D ratio without clear evidence of papilledema.  CARDIOVASCULAR: S1 and S2 sounds are within normal limits, without murmurs, gallops, or rubs.  MUSCULOSKELETAL: Bulk - Normal Tone - Normal Pronator Drift - Absent bilaterally. Ambulation - Gait and station testing deferred due to falls precautions.  R/L 5/5    Shoulder abduction (deltoid/supraspinatus, axillary/suprascapular n, C5) 5/5    Elbow flexion (biceps brachii, musculoskeletal n, C5-6) 5/5    Elbow extension (triceps, radial n, C7) 5/5    Finger adduction (interossei, ulnar n, T1)   5/5    Hip flexion (iliopsoas, L1/L2) 5/5    Knee flexion (hamstrings, sciatic n, L5/S1) 5/5    Knee extension (quadriceps, femoral n, L3/4) 5/5    Ankle dorsiflexion (tibialis anterior, deep fibular n, L4/5) 5/5    Ankle plantarflexion (gastroc, tibial n, S1)  NEUROLOGICAL: MENTAL STATUS: Patient is oriented to person, place and time.  Recent and remote memory are intact.  Attention span and concentration are intact.  Naming, repetition, comprehension and expressive speech are within normal limits.  Patient's fund of knowledge is within normal limits for educational level.  CRANIAL NERVES: Normal    CN II (normal visual acuity and visual fields) Normal    CN III, IV, VI (extraocular muscles are intact) Normal    CN V (facial sensation is intact bilaterally) Normal    CN VII (facial strength is intact bilaterally) Normal    CN VIII (hearing is intact bilaterally) Normal    CN IX/X (palate elevates midline, normal phonation) Normal    CN XI (shoulder shrug strength is normal and symmetric) Normal    CN XII (tongue protrudes midline)   SENSATION: Intact to pain and temp bilaterally (spinothalamic tracts) Intact to position and  vibration bilaterally (dorsal columns)   REFLEXES: R/L 2+/2+    Biceps 2+/2+    Brachioradialis   2+/2+    Patellar 2+/2+    Achilles   COORDINATION/CEREBELLAR: Finger to nose testing is within normal limits..  Lab Results:  Routine Chem:  12-Feb-15 01:10   Glucose, Serum 95  BUN 15  Creatinine (comp) 0.79  Sodium, Serum 139  Potassium, Serum 3.5  Chloride, Serum 105  CO2, Serum 28  Calcium (Total), Serum 8.8  Anion Gap  6  Osmolality (calc) 278  eGFR (African American) >60  eGFR (Non-African American) >60 (eGFR values <39mL/min/1.73 m2 may be an indication of chronic kidney disease (CKD). Calculated eGFR is useful in patients with stable renal function. The eGFR calculation will not be reliable in acutely ill patients when serum creatinine is changing rapidly. It is not useful in  patients on dialysis. The eGFR calculation may not be applicable to patients at the low and high extremes of body sizes, pregnant women, and vegetarians.)  Urine Drugs:  61-YWV-37 10:62   Tricyclic Antidepressant, Ur Qual (comp) NEGATIVE (Result(s) reported on 27 Jan 2014 at 03:16AM.)  Amphetamines, Urine Qual. NEGATIVE  MDMA, Urine Qual. NEGATIVE  Cocaine Metabolite, Urine Qual. NEGATIVE  Opiate, Urine qual NEGATIVE  Phencyclidine, Urine Qual. NEGATIVE  Cannabinoid, Urine Qual. NEGATIVE  Barbiturates, Urine Qual. NEGATIVE  Benzodiazepine, Urine Qual. NEGATIVE (----------------- The URINE DRUG SCREEN provides only a preliminary, unconfirmed analytical test result and should not be used for non-medical  purposes.  Clinical consideration and professional judgment should be  applied to any positive drug screen result due to possible interfering substances.  A more specific alternate chemical method must be used in order to obtain a confirmed analytical result.  Gas chromatography/mass spectrometry (GC/MS) is the preferred confirmatory method.)  Methadone, Urine Qual. NEGATIVE  Routine  Hem:  12-Feb-15 01:10   WBC (CBC)  11.5  RBC (CBC) 4.45  Hemoglobin (CBC) 13.2  Hematocrit (CBC) 39.4  Platelet Count (CBC) 210 (Result(s) reported on 27 Jan 2014 at 02:39AM.)  MCV 89  MCH 29.6  MCHC 33.4  RDW 13.5   Radiology Results: CT:    12-Feb-15 02:09, CT Head Without Contrast  CT Head Without Contrast   REASON FOR EXAM:    WORSENING LEFT FACE, ARM AND LEG WEAKNESS AND NUMBNESS  COMMENTS:       PROCEDURE: CT  - CT HEAD WITHOUT CONTRAST  - Jan 27 2014  2:09AM     CLINICAL DATA:  Patient will, arm and leg weakness.  Numbness.    EXAM:  CT HEAD WITHOUT CONTRAST    TECHNIQUE:  Contiguous axial images were obtained from the base of the skull  through the vertex without intravenous contrast.    COMPARISON:  None.  FINDINGS:  No mass lesion, mass effect, midline shift, hydrocephalus,  hemorrhage. No territorial ischemia or acute infarction. Paranasal  sinuses are within normal limits. Mastoid air cells clear.     IMPRESSION:  Negative CT head.      Electronically Signed    By: Dereck Ligas M.D.    On: 01/27/2014 02:24         Verified By: Melvyn Novas, M.D.,   Radiology Impression: Radiology Impression: Jan 27 2014  4:02PM     CLINICAL DATA:  Left-sided weakness and skin sensation disturbance    EXAM:  MRI HEAD WITHOUT AND WITH CONTRAST    MRA HEAD WITHOUT CONTRAST    MRA NECK WITHOUT AND WITH CONTRAST    IMPRESSION:  1. Negative brain MRI.  No acute infarct.  2. Normal MRA of the head and neck.   Impression/Recommendations: Recommendations:   24 year old woman with a history of anxiety, depression and bipolar disorder presents with left sided weakness.  are now resolved.  Given her age, history of multiple psychiatric conditions, lack of concern for the seemingly major deficit seen only a few hours earlier, and normal lab, test and imaging results, this event was likely function/psychogenic in etiology.  No signs of stroke.  I do not think  this was a TIA.  No history to raise concern for seizure with postictal Todd's.  No additional neurologic workup necessary.  Recommend close outpatient psychiatric followup. have reviewed the results of the most recent imaging studies, tests and labs as outlined above and answered all related questions. have personally viewed the patient's Brain MRI and it unremarkable. and coordinated plan of care with hospitalist.   Electronic Signatures: Anabel Bene (MD)  (Signed 12-Feb-15 23:39)  Authored: REFERRING  PHYSICIAN, Primary Care Physician, Consult, History of Present Illness, Review of Systems, PAST MEDICAL/SURGICAL HISTORY, HOME MEDICATIONS, Current Medications, ALLERGIES, NURSING VITAL SIGNS, Physical Exam-, LAB RESULTS, RADIOLOGY RESULTS, Recommendations   Last Updated: 12-Feb-15 23:39 by Anabel Bene (MD)

## 2015-05-27 ENCOUNTER — Encounter: Payer: Self-pay | Admitting: Emergency Medicine

## 2015-05-27 ENCOUNTER — Emergency Department
Admission: EM | Admit: 2015-05-27 | Discharge: 2015-05-28 | Disposition: A | Discharge status: Home / Self Care | Payer: Medicaid Other | Attending: Student | Admitting: Student

## 2015-05-27 DIAGNOSIS — F17210 Nicotine dependence, cigarettes, uncomplicated: Secondary | ICD-10-CM | POA: Insufficient documentation

## 2015-05-27 DIAGNOSIS — O99331 Smoking (tobacco) complicating pregnancy, first trimester: Secondary | ICD-10-CM | POA: Diagnosis not present

## 2015-05-27 DIAGNOSIS — B379 Candidiasis, unspecified: Secondary | ICD-10-CM | POA: Insufficient documentation

## 2015-05-27 DIAGNOSIS — O98811 Other maternal infectious and parasitic diseases complicating pregnancy, first trimester: Secondary | ICD-10-CM | POA: Insufficient documentation

## 2015-05-27 DIAGNOSIS — O210 Mild hyperemesis gravidarum: Secondary | ICD-10-CM | POA: Insufficient documentation

## 2015-05-27 DIAGNOSIS — Z3A01 Less than 8 weeks gestation of pregnancy: Secondary | ICD-10-CM | POA: Diagnosis not present

## 2015-05-27 DIAGNOSIS — Z79899 Other long term (current) drug therapy: Secondary | ICD-10-CM | POA: Diagnosis not present

## 2015-05-27 HISTORY — DX: Unspecified asthma, uncomplicated: J45.909

## 2015-05-27 LAB — URINALYSIS COMPLETE WITH MICROSCOPIC (ARMC ONLY)
BILIRUBIN URINE: NEGATIVE
Bacteria, UA: NONE SEEN
GLUCOSE, UA: NEGATIVE mg/dL
HGB URINE DIPSTICK: NEGATIVE
KETONES UR: NEGATIVE mg/dL
LEUKOCYTES UA: NEGATIVE
Nitrite: NEGATIVE
PROTEIN: NEGATIVE mg/dL
SPECIFIC GRAVITY, URINE: 1.019 (ref 1.005–1.030)
pH: 6 (ref 5.0–8.0)

## 2015-05-27 LAB — CBC WITH DIFFERENTIAL/PLATELET
BASOS PCT: 0 %
Basophils Absolute: 0 10*3/uL (ref 0–0.1)
EOS ABS: 0.1 10*3/uL (ref 0–0.7)
Eosinophils Relative: 1 %
HEMATOCRIT: 39.5 % (ref 35.0–47.0)
Hemoglobin: 13.1 g/dL (ref 12.0–16.0)
LYMPHS PCT: 26 %
Lymphs Abs: 3 10*3/uL (ref 1.0–3.6)
MCH: 29 pg (ref 26.0–34.0)
MCHC: 33.2 g/dL (ref 32.0–36.0)
MCV: 87.5 fL (ref 80.0–100.0)
MONO ABS: 0.7 10*3/uL (ref 0.2–0.9)
Monocytes Relative: 6 %
NEUTROS ABS: 7.9 10*3/uL — AB (ref 1.4–6.5)
Neutrophils Relative %: 67 %
Platelets: 263 10*3/uL (ref 150–440)
RBC: 4.52 MIL/uL (ref 3.80–5.20)
RDW: 13.7 % (ref 11.5–14.5)
WBC: 11.8 10*3/uL — AB (ref 3.6–11.0)

## 2015-05-27 MED ORDER — SODIUM CHLORIDE 0.9 % IV BOLUS (SEPSIS)
1000.0000 mL | Freq: Once | INTRAVENOUS | Status: AC
  Administered 2015-05-27: 1000 mL via INTRAVENOUS

## 2015-05-27 NOTE — ED Provider Notes (Signed)
[facility] Emergency Department Provider Note  ____________________________________________  Time seen: Approximately 11:17 PM  I have reviewed the triage vital signs and the nursing notes.   HISTORY  Chief Complaint Emesis During Pregnancy    HPI Holly Wagner is a 24 y.o. female with asthma and bipolar disorder, G2 P1 at approximate 8 weeks estimated gestational age by last menstrual period presents for evaluation of gradual onset, intermittent daily nonbloody nonbilious emesis for 4 weeks. She has a history of hyperemesis gravidarum during her last pregnancy. She is taking Phenergan but now is unable to keep it down secondary to vomiting. No fevers. She does have some abdominal cramping. She had one episode of diarrhea tonight. Currently her symptoms are described as severe. No modifying factors. She is also complaining of abnormal vaginal discharge which she reports is foul-smelling.   Past Medical History  Diagnosis Date  . Asthma     There are no active problems to display for this patient.   Past Surgical History  Procedure Laterality Date  . Sinus surgery with instatrak      unsure, maybe 4 years ago    Current Outpatient Rx  Name  Route  Sig  Dispense  Refill  . lurasidone (LATUDA) 40 MG TABS tablet   Oral   Take 40 mg by mouth at bedtime.         Marland Kitchen omeprazole (PRILOSEC) 40 MG capsule   Oral   Take 40 mg by mouth daily.         . Prenatal Vit-Fe Fumarate-FA (PRENATAL MULTIVITAMIN) TABS tablet   Oral   Take 1 tablet by mouth daily at 12 noon.         . traZODone (DESYREL) 100 MG tablet   Oral   Take 100 mg by mouth at bedtime.           Allergies Morphine and related  Family History  Problem Relation Age of Onset  . Cancer Other     Social History History  Substance Use Topics  . Smoking status: Current Every Day Smoker -- 0.50 packs/day    Types: Cigarettes  . Smokeless tobacco: Not on file  . Alcohol Use:  No    Review of Systems Constitutional: No fever/chills Eyes: No visual changes. ENT: No sore throat. Cardiovascular: Denies chest pain. Respiratory: Denies shortness of breath. Gastrointestinal: + abdominal pain.  + nausea, + vomiting.  + diarrhea.  No constipation. Genitourinary: Negative for dysuria. Musculoskeletal: Negative for back pain. Skin: Negative for rash. Neurological: Negative for headaches, focal weakness or numbness.  10-point ROS otherwise negative.  ____________________________________________   PHYSICAL EXAM:  VITAL SIGNS: ED Triage Vitals  Enc Vitals Group     BP 05/27/15 1955 108/66 mmHg     Pulse Rate 05/27/15 2313 96     Resp 05/27/15 1955 18     Temp 05/27/15 1955 99.3 F (37.4 C)     Temp src --      SpO2 05/27/15 1955 98 %     Weight 05/27/15 1955 220 lb (99.791 kg)     Height 05/27/15 1955 5\' 6"  (1.676 m)     Head Cir --      Peak Flow --      Pain Score 05/27/15 1958 5     Pain Loc --      Pain Edu? --      Excl. in [place]? --     Constitutional: Alert and oriented. Well appearing and in no  acute distress. Eyes: Conjunctivae are normal. PERRL. EOMI. Head: Atraumatic. Nose: No congestion/rhinnorhea. Mouth/Throat: Mucous membranes are moist.  Oropharynx non-erythematous. Neck: No stridor.  Cardiovascular: Normal rate, regular rhythm. Grossly normal heart sounds.  Good peripheral circulation. Respiratory: Normal respiratory effort.  No retractions. Lungs CTAB. Gastrointestinal: Soft and nontender. No distention. No abdominal bruits. No CVA tenderness. Pelvic: Musculoskeletal: No lower extremity tenderness nor edema.  No joint effusions. Neurologic:  Normal speech and language. No gross focal neurologic deficits are appreciated. Speech is normal. No gait instability. Skin:  Skin is warm, dry and intact. No rash noted. Psychiatric: Mood and affect are normal. Speech and behavior are normal.  ____________________________________________    LABS (all labs ordered are listed, but only abnormal results are displayed)  Labs Reviewed  WET PREP, GENITAL - Abnormal; Notable for the following:    Yeast Wet Prep HPF POC FEW (*)    WBC, Wet Prep HPF POC FEW (*)    All other components within normal limits  CBC WITH DIFFERENTIAL/PLATELET - Abnormal; Notable for the following:    WBC 11.8 (*)    Neutro Abs 7.9 (*)    All other components within normal limits  COMPREHENSIVE METABOLIC PANEL - Abnormal; Notable for the following:    Sodium 134 (*)    Potassium 3.4 (*)    Calcium 8.2 (*)    All other components within normal limits  HCG, QUANTITATIVE, PREGNANCY - Abnormal; Notable for the following:    hCG, Beta Chain, Quant, S [id number] (*)    All other components within normal limits  URINALYSIS COMPLETEWITH MICROSCOPIC ([facility] ONLY) - Abnormal; Notable for the following:    Color, Urine YELLOW (*)    APPearance CLEAR (*)    Squamous Epithelial / LPF 0-5 (*)    All other components within normal limits  CHLAMYDIA/NGC RT PCR ([facility] ONLY)  LIPASE, BLOOD   ____________________________________________  EKG  none ____________________________________________  RADIOLOGY  Transvaginal ultrasound  IMPRESSION: Single live intrauterine pregnancy noted, with a crown-rump length of 1.1 cm, corresponding to a gestational age of [redacted] weeks 2 days. This matches the gestational age of [redacted] weeks 2 days by LMP, reflecting an estimated date of delivery of January 05, 2016.  ____________________________________________   PROCEDURES  Procedure(s) performed: None  Critical Care performed: No  ____________________________________________   INITIAL IMPRESSION / ASSESSMENT AND PLAN / ED COURSE  Pertinent labs & imaging results that were available during my care of the patient were reviewed by me and considered in my medical decision making (see chart for details).  Holly Wagner is a 24 y.o. female with asthma and bipolar  disorder, G2 P1 at approximate 8 weeks estimated gestational age by last menstrual period presents for evaluation of gradual onset, intermittent daily nonbloody nonbilious emesis for 4 weeks. On exam, she is generally well-appearing and in no acute distress. Vital signs stable. She is afebrile. Benign abdominal exam. Suspect hyperemesis gravidarum given history of same. We'll give IV fluids, IV Phenergan and she reports that this is been helpful for her in the past. Additionally she is having some abdominal pain and early pregnancy so will obtain ultrasound to rule out ectopic, will perform pelvic exam. Labs and urinalysis pending.  ----------------------------------------- 3:31 AM on 05/28/2015 -----------------------------------------  Ultrasound shows single live intrauterine pregnancy. Wet prep positive for yeast so will treat with topical clotrimazole. Patient is sitting up in bed, eating drinking well. She is requesting Phenergan suppositories. We discussed unknown potential risks of this medication to the  fetus and she voices understanding, wants to move forward with Phenergan suppositories. Return precautions discussed. DC with close OB/GYN follow-up. She is comfortable with discharge plan. ____________________________________________   FINAL CLINICAL IMPRESSION(S) / ED DIAGNOSES  Final diagnoses:  Hyperemesis gravidarum  Yeast infection      Joanne Gavel, MD 05/28/15 [phone]

## 2015-05-27 NOTE — ED Notes (Signed)
Pt. States vomiting in the last 4 weeks, when finding out she was pregnant.  Pt. First appointment with ob/gyn at [facility] is the 22nd of June.  Pt. States foul vaginal odor.  Pt. States hx of UTI.

## 2015-05-27 NOTE — ED Notes (Signed)
Pt w/ hx of hyperemesis gravida. Rx'd phenergan PO, unable to keep PO down.

## 2015-05-27 NOTE — ED Notes (Signed)
Pt. States vomiting for the past 3 weeks when finding out she was pregnant.  Pt. States unable to hold down food or drink.  Pt. States getting dizzy when up and walking.  Pt. Was able to walk into triage without difficulty.

## 2015-05-28 ENCOUNTER — Emergency Department: Payer: Medicaid Other

## 2015-05-28 LAB — CHLAMYDIA/NGC RT PCR (ARMC ONLY)
Chlamydia Tr: NOT DETECTED
N gonorrhoeae: NOT DETECTED

## 2015-05-28 LAB — COMPREHENSIVE METABOLIC PANEL
ALBUMIN: 3.6 g/dL (ref 3.5–5.0)
ALT: 24 U/L (ref 14–54)
AST: 22 U/L (ref 15–41)
Alkaline Phosphatase: 56 U/L (ref 38–126)
Anion gap: 8 (ref 5–15)
BILIRUBIN TOTAL: 0.3 mg/dL (ref 0.3–1.2)
BUN: 10 mg/dL (ref 6–20)
CALCIUM: 8.2 mg/dL — AB (ref 8.9–10.3)
CO2: 25 mmol/L (ref 22–32)
Chloride: 101 mmol/L (ref 101–111)
Creatinine, Ser: 0.91 mg/dL (ref 0.44–1.00)
GFR calc Af Amer: >60 mL/min (ref >60)
Glucose, Bld: 98 mg/dL (ref 65–99)
Potassium: 3.4 mmol/L — ABNORMAL LOW (ref 3.5–5.1)
Sodium: 134 mmol/L — ABNORMAL LOW (ref 135–145)
TOTAL PROTEIN: 7 g/dL (ref 6.5–8.1)

## 2015-05-28 LAB — WET PREP, GENITAL
Clue Cells Wet Prep HPF POC: NONE SEEN
Trich, Wet Prep: NONE SEEN

## 2015-05-28 LAB — HCG, QUANTITATIVE, PREGNANCY: HCG, BETA CHAIN, QUANT, S: 131171 m[IU]/mL — AB (ref <5)

## 2015-05-28 LAB — LIPASE, BLOOD: Lipase: 30 U/L (ref 22–51)

## 2015-05-28 MED ORDER — PROMETHAZINE HCL 25 MG RE SUPP: 25.0000 mg | Freq: Three times a day (TID) | RECTAL | Status: DC | PRN

## 2015-05-28 MED ORDER — PROMETHAZINE HCL 25 MG/ML IJ SOLN
INTRAMUSCULAR | Status: AC
  Administered 2015-05-28: 12.5 mg via INTRAVENOUS
  Filled 2015-05-28: qty 1

## 2015-05-28 MED ORDER — PROMETHAZINE HCL 25 MG/ML IJ SOLN
12.5000 mg | Freq: Once | INTRAMUSCULAR | Status: AC
  Administered 2015-05-28: 12.5 mg via INTRAVENOUS

## 2015-05-28 MED ORDER — CLOTRIMAZOLE 1 % VA CREA: 1.0000 | TOPICAL_CREAM | Freq: Every day | VAGINAL | Status: AC

## 2015-05-28 NOTE — ED Notes (Signed)
Pt returned from u/s. No distress noted. Nausea improved.

## 2015-05-28 NOTE — ED Notes (Signed)
Patient transported to Ultrasound 

## 2015-06-05 ENCOUNTER — Encounter: Payer: Self-pay | Admitting: Emergency Medicine

## 2015-06-05 ENCOUNTER — Emergency Department
Admission: EM | Admit: 2015-06-05 | Discharge: 2015-06-05 | Disposition: A | Discharge status: Home / Self Care | Payer: Medicaid Other | Attending: Emergency Medicine | Admitting: Emergency Medicine

## 2015-06-05 DIAGNOSIS — Z79899 Other long term (current) drug therapy: Secondary | ICD-10-CM | POA: Insufficient documentation

## 2015-06-05 DIAGNOSIS — B850 Pediculosis due to Pediculus humanus capitis: Secondary | ICD-10-CM | POA: Diagnosis not present

## 2015-06-05 DIAGNOSIS — Z87891 Personal history of nicotine dependence: Secondary | ICD-10-CM | POA: Insufficient documentation

## 2015-06-05 MED ORDER — PERMETHRIN 5 % EX CREA: 1.0000 "application " | TOPICAL_CREAM | Freq: Once | CUTANEOUS | Status: DC

## 2015-06-05 MED ORDER — IVERMECTIN 0.5 % EX LOTN: 1.0000 | TOPICAL_LOTION | Freq: Once | CUTANEOUS | Status: DC

## 2015-06-05 NOTE — ED Provider Notes (Signed)
[facility] Emergency Department Provider Note  ____________________________________________  Time seen: 1625   I have reviewed the triage vital signs and the nursing notes.   HISTORY  Chief Complaint Head Lice    HPI Holly Wagner is a 24 y.o. female into today with complaint of head lice she believes she caught from her daughter who is here also to be treated she otherwise has no complaints except for severe itching she does have bugs crawling out of her hair multiple leg sex found she is approximately 8-[redacted] weeks pregnant also was having no problems or complications that currently and is here for lice treatment   Past Medical History  Diagnosis Date  . Asthma     There are no active problems to display for this patient.   Past Surgical History  Procedure Laterality Date  . Sinus surgery with instatrak      unsure, maybe 4 years ago    Current Outpatient Rx  Name  Route  Sig  Dispense  Refill  . Ivermectin 0.5 % LOTN   Apply externally   Apply 1 Tube topically once. Topical lotion: For external use only. Apply to dry scalp and hair closest to scalp first, then apply outward towards ends of hair; completely covering scalp and hair. Leave on for 10 minutes (start timing treatment after the scalp and hair have been completely covered). The hair should then be rinsed thoroughly with warm water. Avoid contact with the eyes.   1 Tube   1   . lurasidone (LATUDA) 40 MG TABS tablet   Oral   Take 40 mg by mouth at bedtime.         Marland Kitchen omeprazole (PRILOSEC) 40 MG capsule   Oral   Take 40 mg by mouth daily.         . permethrin (ACTICIN) 5 % cream   Topical   Apply 1 application topically once. Prior to application, wash hair with conditioner-free shampoo; rinse with water and towel dry. Apply a sufficient amount of lotion or cream rinse to saturate the hair and scalp (especially behind the ears and nape of neck). Leave on hair for no longer than  10 minutes, then rinse off with warm water; remove remaining nits with nit comb. A single application is generally sufficient; however may repeat 7 days   60 g   1   . Prenatal Vit-Fe Fumarate-FA (PRENATAL MULTIVITAMIN) TABS tablet   Oral   Take 1 tablet by mouth daily at 12 noon.         . promethazine (PHENERGAN) 25 MG suppository   Rectal   Place 1 suppository (25 mg total) rectally every 8 (eight) hours as needed for nausea or vomiting.   15 suppository   0   . traZODone (DESYREL) 100 MG tablet   Oral   Take 100 mg by mouth at bedtime.           Allergies Morphine and related  Family History  Problem Relation Age of Onset  . Cancer Other     Social History History  Substance Use Topics  . Smoking status: Former Smoker -- 0.50 packs/day    Types: Cigarettes  . Smokeless tobacco: Not on file  . Alcohol Use: No    Review of Systems Constitutional: No fever/chills Eyes: No visual changes. ENT: No sore throat. Cardiovascular: Denies chest pain. Respiratory: Denies shortness of breath. Gastrointestinal: No abdominal pain.  No nausea, no vomiting.  No diarrhea.  No constipation.  Genitourinary: Negative for dysuria. Musculoskeletal: Negative for back pain. Skin: Negative for rash. Neurological: Negative for headaches, focal weakness or numbness.  10-point ROS otherwise negative.  ____________________________________________   PHYSICAL EXAM:  VITAL SIGNS: ED Triage Vitals  Enc Vitals Group     BP 06/05/15 1634 110/60 mmHg     Pulse Rate 06/05/15 1634 90     Resp 06/05/15 1634 18     Temp 06/05/15 1634 98 F (36.7 C)     Temp Source 06/05/15 1634 Oral     SpO2 06/05/15 1626 100 %     Weight 06/05/15 1626 220 lb (99.791 kg)     Height 06/05/15 1626 5\' 6"  (1.676 m)     Head Cir --      Peak Flow --      Pain Score --      Pain Loc --      Pain Edu? --      Excl. in [place]? --     Constitutional: Alert and oriented. Well appearing and in no acute  distress. Eyes: Conjunctivae are normal. PERRL. EOMI. Head: Atraumatic. His nose multiple small bugs crawling through her hair which appear to be head lice as well as multiple egg sacks Nose: No congestion/rhinnorhea. Mouth/Throat: Mucous membranes are moist.  Oropharynx non-erythematous. Neck: No stridor.   Cardiovascular: Normal rate, regular rhythm. Grossly normal heart sounds.  Good peripheral circulation. Respiratory: Normal respiratory effort.  No retractions. Lungs CTAB. Musculoskeletal: No lower extremity tenderness nor edema.  No joint effusions. Neurologic:  Normal speech and language. No gross focal neurologic deficits are appreciated. Speech is normal. No gait instability. Skin:  Skin is warm, dry and intact. No rash noted. Psychiatric: Mood and affect are normal. Speech and behavior are normal.  ____________________________________________     PROCEDURES  Procedure(s) performed: None  Critical Care performed: No  ____________________________________________   INITIAL IMPRESSION / ASSESSMENT AND PLAN / ED COURSE  Pertinent labs & imaging results that were available during my care of the patient were reviewed by me and considered in my medical decision making (see chart for details).  Initial impression on this patient is had lice given that she is pregnant recommend that she try the permethrin cream first she has had this before and was treated with a medicine called SK lice which seemed to work for her I wrote her prescription for that but would recommend that she follow up with her OB for treatment recommendation return here for any acute concerns or worsening symptoms patient was given information on treatment and decontamination of her house from lice ____________________________________________   FINAL CLINICAL IMPRESSION(S) / ED DIAGNOSES  Final diagnoses:  Head lice     Tationa Stech Verdene Rio, PA-C 06/05/15 [place], MD 06/05/15 2114

## 2015-06-05 NOTE — Discharge Instructions (Signed)
Head and Pubic Lice Lice are tiny, light brown insects with claws on the ends of their legs. They are small parasites that live on the human body. Lice often make their home in your hair. They hatch from little round eggs (nits), which are attached to the base of hairs. They spread by:  Direct contact with an infested person.  Infested personal items such as combs, brushes, towels, clothing, pillow cases and sheets. The parasite that causes your condition may also live in clothes which have been worn within the week before treatment. Therefore, it is necessary to wash your clothes, bed linens, towels, combs and brushes. Any woolens can be put in an air-tight plastic bag for one week. You need to use fresh clothes, towels and sheets after your treatment is completed. Re-treatment is usually not necessary if instructions are followed. If necessary, treatment may be repeated in 7 days. The entire family may require treatment. Sexual partners should be treated if the nits are present in the pubic area. TREATMENT  Apply enough medicated shampoo or cream to wet hair and skin in and around the infected areas.  Work thoroughly into hair and leave in according to instructions.  Add a small amount of water until a good lather forms.  Rinse thoroughly.  Towel briskly.  When hair is dry, any remaining nits, cream or shampoo may be removed with a fine-tooth comb or tweezers. The nits resemble dandruff; however they are glued to the hair follicle and are difficult to brush out. Frequent fine combing and shampoos are necessary. A towel soaked in white vinegar and left on the hair for 2 hours will also help soften the glue which holds the nits on the hair. Medicated shampoo or cream should not be used on children or pregnant women without a caregiver's prescription or instructions. SEEK MEDICAL CARE IF:   You or your child develops sores that look infected.  The rash does not go away in one week.  The  lice or nits return or persist in spite of treatment. Document Released: 12/02/2005 Document Revised: 02/24/2012 Document Reviewed: 07/01/2007 [facility] Patient Information 2015 [place], [place]. This information is not intended to replace advice given to you by your health care provider. Make sure you discuss any questions you have with your health care provider. What are lice? -- Lice are tiny insects that can live on people's skin and in their hair, and cause itching. Three types of lice can live on or close to people's bodies (figure 1): ?Head lice can live on your scalp and in the hair on your head ?Body lice can live in the clothes you wear, and feed on your body (these are uncommon) ?Pubic lice, also called "crabs," can live in your pubic hair, eyebrows, eyelashes, armpits, beard or mustache, or other areas (picture 1) Lice do not fly or jump. They are spread by person-to-person contact or by sharing clothes and personal items. For example, you can get pubic lice by having sex with someone who has it. You can get head lice from head-to-head contact with someone with has it. You might also be able to get head lice from sharing items like hats or combs, but this probably doesn't happen as often. Lice can lay eggs, also called "nits," which then hatch into new lice. People can find lice and nits on their body or in their hair (picture 2). How can I tell if I have lice? -- Most people have itching on the part of the body  where the lice are. With pubic lice, people can also have pale blue spots on the lower belly or upper thighs, or itching and redness of the eyes or eyelids. But some people might not have any symptoms at all. They might find out they have lice only by seeing small white nits or live lice in their hair. Sometimes it is easier to see nits, because lice can move quickly and hide from view. Is there anything I can do on my own to get rid of lice? -- Yes. To get rid of head and pubic lice,  you can: ?Use a special fine-toothed comb to carefully comb out nits and lice from your hair ?Use a non-prescription cream or lotion on your hair or body that kills lice. Be sure to follow all of the directions on the label. You might hear or read about other treatments for lice that involve products like olive oil or mayonnaise. Most doctors do not recommend these "natural" treatments. You will also need to get rid of and kill the lice on items in your home so you don't get lice again. To do this, you can: ?Wash clothes, bedding, and towels in hot water and dry them on the hottest setting ?Vacuum your carpets and furniture ?Put things you cannot wash into a sealed plastic bag for 2 weeks If you or your child has lice: ?All of the adults and children in the home should be checked for lice ?Talk with the school nurse If you have pubic lice, you need to: ?Tell anyone you had sex with in the past month, so he or she can be treated, too ?Be tested for other diseases you can catch when you have sex. The person who gave you pubic lice might have given you another disease, too

## 2015-06-05 NOTE — ED Notes (Signed)
Presents with head lice.  States she has tried everything otc w/o relief

## 2015-09-08 ENCOUNTER — Observation Stay
Admission: EM | Admit: 2015-09-08 | Discharge: 2015-09-09 | Disposition: A | Discharge status: Home / Self Care | Payer: Medicaid Other | Attending: Obstetrics and Gynecology | Admitting: Obstetrics and Gynecology

## 2015-09-08 DIAGNOSIS — Z87891 Personal history of nicotine dependence: Secondary | ICD-10-CM | POA: Insufficient documentation

## 2015-09-08 DIAGNOSIS — M545 Low back pain: Secondary | ICD-10-CM | POA: Insufficient documentation

## 2015-09-08 DIAGNOSIS — Z3A22 22 weeks gestation of pregnancy: Secondary | ICD-10-CM | POA: Insufficient documentation

## 2015-09-08 DIAGNOSIS — O26892 Other specified pregnancy related conditions, second trimester: Principal | ICD-10-CM | POA: Insufficient documentation

## 2015-09-09 ENCOUNTER — Telehealth: Payer: Self-pay | Admitting: Obstetrics and Gynecology

## 2015-09-09 DIAGNOSIS — M545 Low back pain: Secondary | ICD-10-CM | POA: Diagnosis present

## 2015-09-09 DIAGNOSIS — O26892 Other specified pregnancy related conditions, second trimester: Secondary | ICD-10-CM | POA: Diagnosis not present

## 2015-09-09 DIAGNOSIS — Z87891 Personal history of nicotine dependence: Secondary | ICD-10-CM | POA: Diagnosis not present

## 2015-09-09 DIAGNOSIS — Z3A22 22 weeks gestation of pregnancy: Secondary | ICD-10-CM | POA: Diagnosis not present

## 2015-09-09 LAB — URINALYSIS COMPLETE WITH MICROSCOPIC (ARMC ONLY)
Bilirubin Urine: NEGATIVE
GLUCOSE, UA: 50 mg/dL — AB
Hgb urine dipstick: NEGATIVE
Ketones, ur: NEGATIVE mg/dL
NITRITE: NEGATIVE
Protein, ur: NEGATIVE mg/dL
Specific Gravity, Urine: 1.018 (ref 1.005–1.030)
pH: 7 (ref 5.0–8.0)

## 2015-09-09 LAB — CHLAMYDIA/NGC RT PCR (ARMC ONLY)
Chlamydia Tr: NOT DETECTED
N gonorrhoeae: NOT DETECTED

## 2015-09-09 NOTE — Discharge Summary (Signed)
Patient discharged home in good condition, ambulatory with steady gait. Discharge instructions reviewed with patient including follow up appointments and when to seek medical attention. All questions answered.

## 2015-09-09 NOTE — H&P (Signed)
Obstetrics Admission History & Physical  09/09/2015 - 1:19 AM Primary OBGYN: [facility]  Chief Complaint: vaginal bleeding, low back pain  History of Present Illness  24 y.o. G2P1001 @ 22/0(Dating: 2nd trimester anatomy scan, [facility] 1/28), with the above CC. Pregnancy complicated by: h/o GDM, obesity, h/o STIs  Ms. Holly Wagner states that started having low back pain yesterday and spotting when wiping today. No fevers, chills, n/v, dysuria, hematuria, decreased FM, LOF. Had sex 2-3 days ago  Review of Systems:  her 12 point review of systems is negative or as noted in the History of Present Illness.  PMHx:  Past Medical History  Diagnosis Date  . Asthma    PSHx:  Past Surgical History  Procedure Laterality Date  . Sinus surgery with instatrak      unsure, maybe 4 years ago   Medications:  Prescriptions prior to admission  Medication Sig Dispense Refill Last Dose  . Ivermectin 0.5 % LOTN Apply 1 Tube topically once. Topical lotion: For external use only. Apply to dry scalp and hair closest to scalp first, then apply outward towards ends of hair; completely covering scalp and hair. Leave on for 10 minutes (start timing treatment after the scalp and hair have been completely covered). The hair should then be rinsed thoroughly with warm water. Avoid contact with the eyes. 1 Tube 1   . lurasidone (LATUDA) 40 MG TABS tablet Take 40 mg by mouth at bedtime.   05/27/2015 at Unknown time  . omeprazole (PRILOSEC) 40 MG capsule Take 40 mg by mouth daily.   05/28/2015 at Unknown time  . permethrin (ACTICIN) 5 % cream Apply 1 application topically once. Prior to application, wash hair with conditioner-free shampoo; rinse with water and towel dry. Apply a sufficient amount of lotion or cream rinse to saturate the hair and scalp (especially behind the ears and nape of neck). Leave on hair for no longer than 10 minutes, then rinse off with warm water; remove remaining nits with nit comb. A single application is  generally sufficient; however may repeat 7 days 60 g 1   . Prenatal Vit-Fe Fumarate-FA (PRENATAL MULTIVITAMIN) TABS tablet Take 1 tablet by mouth daily at 12 noon.   05/28/2015 at Unknown time  . promethazine (PHENERGAN) 25 MG suppository Place 1 suppository (25 mg total) rectally every 8 (eight) hours as needed for nausea or vomiting. 15 suppository 0   . traZODone (DESYREL) 100 MG tablet Take 100 mg by mouth at bedtime.   05/27/2015 at Unknown time     Allergies: is allergic to morphine and related. OBHx: TSVD, GDM GYNHx:  History of abnormal pap smears: Yes.  No surgeries History of STIs: Yes.  Marland Kitchen             FHx:  Family History  Problem Relation Age of Onset  . Cancer Other    Soc Hx:  Social History   Social History  . Marital Status: Single    Spouse Name: N/A  . Number of Children: N/A  . Years of Education: N/A   Occupational History  . Not on file.   Social History Main Topics  . Smoking status: Former Smoker -- 0.50 packs/day    Types: Cigarettes  . Smokeless tobacco: Not on file  . Alcohol Use: No  . Drug Use: No  . Sexual Activity: Yes   Other Topics Concern  . Not on file   Social History Narrative    Objective  AF VS normal and stable  Dopplers: normal  Toco: quiet  General: Well nourished, well developed female in no acute distress.  Skin:  Warm and dry.  Respiratory:   Normal respiratory effort Abdomen: obese, nttp Back: No CVAT Neuro/Psych:  Normal mood and affect.   SSE: negative, cx visually closed. Minimal white d/c in vault. No bleeding SVE: cl/long/high  Labs: Rh pos per Care Everywhere. Wet prep with many WBCs, normal squam cells  Radiology Normal anatomy scan at [facility] per [place]   24 y.o. G2P1001 @ 22/0 currently stable *IUP: fetal status reassuing *VB: negative anatomy scan, no e/o previa. No e/o bleeding. Reassurance given. Rh pos *Back: U/a sent. Negative cervical exam. GC/CT sent *Dispo: after  u/a is back.  Durene Romans MD [facility] OBGYN Pager [phone]

## 2015-09-30 ENCOUNTER — Emergency Department
Admission: EM | Admit: 2015-09-30 | Discharge: 2015-09-30 | Disposition: A | Discharge status: Home / Self Care | Payer: Medicaid Other | Attending: Emergency Medicine | Admitting: Emergency Medicine

## 2015-09-30 ENCOUNTER — Encounter: Payer: Self-pay | Admitting: Emergency Medicine

## 2015-09-30 ENCOUNTER — Emergency Department: Payer: Medicaid Other

## 2015-09-30 DIAGNOSIS — Z87891 Personal history of nicotine dependence: Secondary | ICD-10-CM | POA: Insufficient documentation

## 2015-09-30 DIAGNOSIS — J45901 Unspecified asthma with (acute) exacerbation: Secondary | ICD-10-CM | POA: Insufficient documentation

## 2015-09-30 DIAGNOSIS — J209 Acute bronchitis, unspecified: Secondary | ICD-10-CM

## 2015-09-30 DIAGNOSIS — O9989 Other specified diseases and conditions complicating pregnancy, childbirth and the puerperium: Secondary | ICD-10-CM | POA: Diagnosis not present

## 2015-09-30 DIAGNOSIS — O99512 Diseases of the respiratory system complicating pregnancy, second trimester: Secondary | ICD-10-CM | POA: Insufficient documentation

## 2015-09-30 DIAGNOSIS — Z7952 Long term (current) use of systemic steroids: Secondary | ICD-10-CM | POA: Diagnosis not present

## 2015-09-30 DIAGNOSIS — Z3A24 24 weeks gestation of pregnancy: Secondary | ICD-10-CM | POA: Insufficient documentation

## 2015-09-30 DIAGNOSIS — Z79899 Other long term (current) drug therapy: Secondary | ICD-10-CM | POA: Insufficient documentation

## 2015-09-30 DIAGNOSIS — R1013 Epigastric pain: Secondary | ICD-10-CM | POA: Diagnosis not present

## 2015-09-30 LAB — CBC
HCT: 33.4 % — ABNORMAL LOW (ref 35.0–47.0)
Hemoglobin: 11.4 g/dL — ABNORMAL LOW (ref 12.0–16.0)
MCH: 29.8 pg (ref 26.0–34.0)
MCHC: 34.2 g/dL (ref 32.0–36.0)
MCV: 87.1 fL (ref 80.0–100.0)
Platelets: 253 10*3/uL (ref 150–440)
RBC: 3.83 MIL/uL (ref 3.80–5.20)
RDW: 13.5 % (ref 11.5–14.5)
WBC: 10.8 10*3/uL (ref 3.6–11.0)

## 2015-09-30 LAB — COMPREHENSIVE METABOLIC PANEL
ALT: 21 U/L (ref 14–54)
AST: 26 U/L (ref 15–41)
Albumin: 2.8 g/dL — ABNORMAL LOW (ref 3.5–5.0)
Alkaline Phosphatase: 78 U/L (ref 38–126)
Anion gap: 10 (ref 5–15)
BUN: 9 mg/dL (ref 6–20)
CO2: 21 mmol/L — AB (ref 22–32)
CREATININE: 0.67 mg/dL (ref 0.44–1.00)
Calcium: 7.8 mg/dL — ABNORMAL LOW (ref 8.9–10.3)
Chloride: 98 mmol/L — ABNORMAL LOW (ref 101–111)
GFR calc non Af Amer: >60 mL/min (ref >60)
Glucose, Bld: 110 mg/dL — ABNORMAL HIGH (ref 65–99)
Potassium: 2.8 mmol/L — CL (ref 3.5–5.1)
Sodium: 129 mmol/L — ABNORMAL LOW (ref 135–145)
Total Bilirubin: <0.1 mg/dL — ABNORMAL LOW (ref 0.3–1.2)
Total Protein: 6.6 g/dL (ref 6.5–8.1)

## 2015-09-30 LAB — LIPASE, BLOOD: Lipase: 21 U/L — ABNORMAL LOW (ref 22–51)

## 2015-09-30 MED ORDER — PREDNISONE 20 MG PO TABS
40.0000 mg | ORAL_TABLET | Freq: Once | ORAL | Status: AC
  Administered 2015-09-30: 40 mg via ORAL
  Filled 2015-09-30: qty 2

## 2015-09-30 MED ORDER — PREDNISONE 20 MG PO TABS: 20.0000 mg | ORAL_TABLET | Freq: Every day | ORAL | Status: DC

## 2015-09-30 MED ORDER — POTASSIUM CHLORIDE CRYS ER 20 MEQ PO TBCR
40.0000 meq | EXTENDED_RELEASE_TABLET | Freq: Once | ORAL | Status: AC
  Administered 2015-09-30: 40 meq via ORAL

## 2015-09-30 MED ORDER — POTASSIUM CHLORIDE CRYS ER 20 MEQ PO TBCR
EXTENDED_RELEASE_TABLET | ORAL | Status: AC
  Filled 2015-09-30: qty 2

## 2015-09-30 NOTE — Discharge Instructions (Signed)

## 2015-09-30 NOTE — ED Notes (Addendum)
Pt says she took the last dose of her second round of antibiotics for bronchitis this morning; pt says still feeling short of breath; worse with exertion; chest pressure/tightness; pt has history of asthma; pt is about 6 months pregnant, due date 01/11/2016; pt talking in complete coherent sentences in triage;

## 2015-09-30 NOTE — ED Provider Notes (Signed)
[facility] Emergency Department Provider Note  ____________________________________________  Time seen: 8:45 PM  I have reviewed the triage vital signs and the nursing notes.   HISTORY  Chief Complaint Shortness of Breath    HPI Holly Wagner is a 24 y.o. female who complains of nonproductive cough and occasional shortness of breath. She was seen by her primary care doctor 10 days ago who started her on a Z-Pak. 5 days later the symptoms had not resolved so she saw her obstetrician who started her on another Z-Pak. She is finishing that today, but still her symptoms have not quite resolved. The cough is much much better and the shortness of breath and chest tightness are much better and intermittent.  She reports her obstetrician told her that if her symptoms had not resolved at the end of this course that they would start her on steroids. She denies any exertional or pleuritic symptoms. No fever chills nausea vomiting diarrhea. Normal oral intake.has albuterol for asthma which she just refilled. She took 4 puffs prior to arrival in the ED tonight.     Past Medical History  Diagnosis Date  . Asthma      Patient Active Problem List   Diagnosis Date Noted  . Indication for care in labor and delivery, antepartum 09/09/2015     Past Surgical History  Procedure Laterality Date  . Sinus surgery with instatrak      unsure, maybe 4 years ago     Current Outpatient Rx  Name  Route  Sig  Dispense  Refill  . Ivermectin 0.5 % LOTN   Apply externally   Apply 1 Tube topically once. Topical lotion: For external use only. Apply to dry scalp and hair closest to scalp first, then apply outward towards ends of hair; completely covering scalp and hair. Leave on for 10 minutes (start timing treatment after the scalp and hair have been completely covered). The hair should then be rinsed thoroughly with warm water. Avoid contact with the eyes.   1 Tube   1   .  lurasidone (LATUDA) 40 MG TABS tablet   Oral   Take 40 mg by mouth at bedtime.         Marland Kitchen omeprazole (PRILOSEC) 40 MG capsule   Oral   Take 40 mg by mouth daily.         . permethrin (ACTICIN) 5 % cream   Topical   Apply 1 application topically once. Prior to application, wash hair with conditioner-free shampoo; rinse with water and towel dry. Apply a sufficient amount of lotion or cream rinse to saturate the hair and scalp (especially behind the ears and nape of neck). Leave on hair for no longer than 10 minutes, then rinse off with warm water; remove remaining nits with nit comb. A single application is generally sufficient; however may repeat 7 days   60 g   1   . predniSONE (DELTASONE) 20 MG tablet   Oral   Take 1 tablet (20 mg total) by mouth daily.   5 tablet   0   . Prenatal Vit-Fe Fumarate-FA (PRENATAL MULTIVITAMIN) TABS tablet   Oral   Take 1 tablet by mouth daily at 12 noon.         . promethazine (PHENERGAN) 25 MG suppository   Rectal   Place 1 suppository (25 mg total) rectally every 8 (eight) hours as needed for nausea or vomiting.   15 suppository   0   . traZODone (  DESYREL) 100 MG tablet   Oral   Take 100 mg by mouth at bedtime.            Allergies Morphine and related   Family History  Problem Relation Age of Onset  . Cancer Other     Social History Social History  Substance Use Topics  . Smoking status: Former Smoker -- 0.50 packs/day    Types: Cigarettes  . Smokeless tobacco: None  . Alcohol Use: No    Review of Systems  Constitutional:   No fever or chills. No weight changes Eyes:   No blurry vision or double vision.  ENT:   No sore throat. Cardiovascular:   No chest pain. Respiratory:   Positive shortness of breath and nonproductive cough Gastrointestinal:   Negative for abdominal pain, vomiting and diarrhea.  No BRBPR or melena. Genitourinary:   Negative for dysuria, urinary retention, bloody urine, or difficulty  urinating. Musculoskeletal:   Negative for back pain. No joint swelling or pain. Skin:   Negative for rash. Neurological:   Negative for headaches, focal weakness or numbness. Psychiatric:  No anxiety or depression.   Endocrine:  No hot/cold intolerance, changes in energy, or sleep difficulty.  10-point ROS otherwise negative.  ____________________________________________   PHYSICAL EXAM:  VITAL SIGNS: ED Triage Vitals  Enc Vitals Group     BP 09/30/15 1927 112/65 mmHg     Pulse Rate 09/30/15 1927 110     Resp 09/30/15 1927 20     Temp 09/30/15 1927 97.6 F (36.4 C)     Temp Source 09/30/15 1927 Oral     SpO2 09/30/15 1927 99 %     Weight 09/30/15 1927 203 lb (92.08 kg)     Height 09/30/15 1927 5\' 6"  (1.676 m)     Head Cir --      Peak Flow --      Pain Score 09/30/15 1929 3     Pain Loc --      Pain Edu? --      Excl. in [place]? --      Constitutional:   Alert and oriented. Well appearing and in no distress.calm and smiling Eyes:   No scleral icterus. No conjunctival pallor. PERRL. EOMI ENT   Head:   Normocephalic and atraumatic.   Nose:   No congestion/rhinnorhea. No septal hematoma   Mouth/Throat:   MMM, no pharyngeal erythema. No peritonsillar mass. No uvula shift.   Neck:   No stridor. No SubQ emphysema. No meningismus. Hematological/Lymphatic/Immunilogical:   No cervical lymphadenopathy. Cardiovascular:   RRR. Normal and symmetric distal pulses are present in all extremities. No murmurs, rubs, or gallops. Respiratory:   Normal respiratory effort without tachypnea nor retractions. Breath sounds are clear and equal bilaterally.scant expiratory wheezing that is accentuated with forced expirations Gastrointestinal:   Soft with mild epigastric tenderness. No distention. There is no CVA tenderness.  No rebound, rigidity, or guarding. Genitourinary:   deferred Musculoskeletal:   Nontender with normal range of motion in all extremities. No joint effusions.  No  lower extremity tenderness.  No edema. Neurologic:   Normal speech and language.  CN 2-10 normal. Motor grossly intact. No pronator drift.  Normal gait. No gross focal neurologic deficits are appreciated.  Skin:    Skin is warm, dry and intact. No rash noted.  No petechiae, purpura, or bullae. Psychiatric:   Mood and affect are normal. Speech and behavior are normal. Patient exhibits appropriate insight and judgment.  ____________________________________________    LABS (  pertinent positives/negatives) (all labs ordered are listed, but only abnormal results are displayed) Labs Reviewed  CBC - Abnormal; Notable for the following:    Hemoglobin 11.4 (*)    HCT 33.4 (*)    All other components within normal limits  COMPREHENSIVE METABOLIC PANEL - Abnormal; Notable for the following:    Sodium 129 (*)    Potassium 2.8 (*)    Chloride 98 (*)    CO2 21 (*)    Glucose, Bld 110 (*)    Calcium 7.8 (*)    Albumin 2.8 (*)    Total Bilirubin <0.1 (*)    All other components within normal limits  LIPASE, BLOOD - Abnormal; Notable for the following:    Lipase 21 (*)    All other components within normal limits   ____________________________________________   EKG  Interpreted by me Normal sinus rhythm rate of 97, normal axis intervals QRS and ST segments and T waves  ____________________________________________    RADIOLOGY  Chest x-ray unremarkable  ____________________________________________   PROCEDURES   ____________________________________________   INITIAL IMPRESSION / ASSESSMENT AND PLAN / ED COURSE  Pertinent labs & imaging results that were available during my care of the patient were reviewed by me and considered in my medical decision making (see chart for details).  Patient well appearing no acute distress nontoxic. Low suspicion for pneumonia or PE. No evidence of pneumothorax or ACS. We'll start her on steroids as her symptoms have not quite resolved yet  and this does seem like a reasonable next step. She states that she has a lot of positive gastritis that is worsened during pregnancy and I think that is the cause of her epigastric tenderness. Her LFTs and workup are otherwise fine. However follow-up with primary care or obstetrics this week for continued monitoring of her symptoms.     ____________________________________________   FINAL CLINICAL IMPRESSION(S) / ED DIAGNOSES  Final diagnoses:  Acute bronchitis, unspecified organism      Carrie Mew, MD 09/30/15 2223

## 2015-09-30 NOTE — ED Notes (Signed)
Notified Apolonio Schneiders, Agricultural consultant of pt's potassium level of 2.8.

## 2015-10-03 ENCOUNTER — Inpatient Hospital Stay
Admission: EM | Admit: 2015-10-03 | Discharge: 2015-10-04 | Disposition: A | Discharge status: Home / Self Care | Payer: Medicaid Other | Attending: Obstetrics & Gynecology | Admitting: Obstetrics & Gynecology

## 2015-10-03 DIAGNOSIS — F319 Bipolar disorder, unspecified: Secondary | ICD-10-CM | POA: Insufficient documentation

## 2015-10-03 DIAGNOSIS — O99342 Other mental disorders complicating pregnancy, second trimester: Secondary | ICD-10-CM | POA: Diagnosis not present

## 2015-10-03 DIAGNOSIS — Z043 Encounter for examination and observation following other accident: Secondary | ICD-10-CM | POA: Diagnosis present

## 2015-10-03 DIAGNOSIS — O26892 Other specified pregnancy related conditions, second trimester: Secondary | ICD-10-CM | POA: Diagnosis not present

## 2015-10-03 DIAGNOSIS — K219 Gastro-esophageal reflux disease without esophagitis: Secondary | ICD-10-CM | POA: Insufficient documentation

## 2015-10-03 DIAGNOSIS — Z3A26 26 weeks gestation of pregnancy: Secondary | ICD-10-CM | POA: Insufficient documentation

## 2015-10-03 MED ORDER — ONDANSETRON 4 MG PO TBDP: 4.0000 mg | ORAL_TABLET | Freq: Once | ORAL | Status: DC

## 2015-10-03 MED ORDER — ONDANSETRON 4 MG PO TBDP
ORAL_TABLET | ORAL | Status: AC
  Administered 2015-10-03: 4 mg
  Filled 2015-10-03: qty 1

## 2015-10-03 NOTE — OB Triage Note (Signed)
Pt. states she experienced a fall in her bedroom today at approximately 2000 this evening; landed primarily on knee, followed by painful back spasms. Reports pain at a 3 out of 10 now, but says "movement hurts worse." Denies LOF, reports mucous discharge. Reports good fetal movement.

## 2015-10-04 DIAGNOSIS — Z043 Encounter for examination and observation following other accident: Secondary | ICD-10-CM | POA: Diagnosis not present

## 2015-10-04 MED ORDER — CYCLOBENZAPRINE HCL 5 MG PO TABS: 5.0000 mg | ORAL_TABLET | Freq: Three times a day (TID) | ORAL | Status: DC | PRN

## 2015-10-04 NOTE — Discharge Instructions (Signed)
What Do I Need to Know About Injuries During Pregnancy? Trauma is the most common cause of injury and death in pregnant women. This can also result in significant harm or death of the baby. Your baby is protected in the womb (uterus) by a sac filled with fluid (amniotic sac). Your baby can be harmed if there is direct, high-impact trauma to your abdomen and pelvis. This type of trauma can result in tearing of your uterus, the placenta pulling away from the wall of the uterus (placenta abruption), or the amniotic sac breaking open (rupture of membranes). These injuries can decrease or stop the blood supply to your baby or cause you to go into labor earlier than expected. Minor falls and low-impact automobile accidents do not usually harm your baby, even if they do minimally harm you. WHAT KIND OF INJURIES CAN AFFECT MY PREGNANCY? The most common causes of injury or death to a baby include:  Falls. Falls are more common in the second and third trimester of the pregnancy. Factors that increase your risk of falling include:  Increase in your weight.  The change in your center of gravity.  Tripping over an object that cannot be seen.  Increased looseness (laxity) of your ligaments resulting in less coordinated movements (you may feel clumsy).  Falling during high-risk activities like horseback riding or skiing.  Automobile accidents. It is important to wear your seat belt properly, with the lap belt below your abdomen, and always practice safe driving.  Domestic violence or assault.  Burns (Building services engineer). The most common causes of injury or death to the pregnant woman include:  Injuries that cause severe bleeding, shock, and loss of blood flow to major organs.  Head and neck injuries that result in severe brain or spinal damage.  Chest trauma that can cause direct injury to the heart and lungs or any injury that affects the area enclosed by the ribs. Trauma to this area can result in  cardiorespiratory arrest. WHAT CAN I DO TO PROTECT MYSELF AND MY BABY FROM INJURY WHILE I AM PREGNANT?  Remove slippery rugs and loose objects on the floor that increase your risk of tripping.  Avoid walking on wet or slippery floors.  Wear comfortable shoes that have a good grip on the sole. Do not wear high-heeled shoes.  Always wear your seat belt properly, with the lap belt below your abdomen, and always practice safe driving. Do not ride on a motorcycle while pregnant.  Do not participate in high-impact activities or sports.  Avoid fires, starting fires, lifting heavy pots of boiling or hot liquids, and fixing electrical problems.  Only take over-the-counter or prescription medicines for pain, fever, or discomfort as directed by your health care provider.  Know your blood type and the father's blood type in case you develop vaginal bleeding or experience an injury for which a blood transfusion may be necessary.  Call your local emergency services (911 in the [place] if you are a victim of domestic violence or assault. Spousal abuse can be a significant cause of trauma during pregnancy. For help and support, contact the [employer]. WHEN SHOULD I SEEK IMMEDIATE MEDICAL CARE?   You fall on your abdomen or experience any high-force accident or injury.  You have been assaulted (domestic or otherwise).  You have been in a car accident.  You develop vaginal bleeding.  You develop fluid leaking from the vagina.  You develop uterine contractions (pelvic cramping, pain, or significant low back  pain).  You become weak or faint, or have uncontrolled vomiting after trauma.  You had a serious burn. This includes burns to the face, neck, hands, or genitals, or burns greater than the size of your palm anywhere else.  You develop neck stiffness or pain after a fall or from other trauma.  You develop a headache or vision problems after a fall or from other  trauma.  You do not feel the baby moving or the baby is not moving as much as before a fall or other trauma.   This information is not intended to replace advice given to you by your health care provider. Make sure you discuss any questions you have with your health care provider.   Document Released: 01/09/2005 Document Revised: 12/23/2014 Document Reviewed: 09/08/2013 Elsevier Interactive Patient Education [employer].
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
## 2015-10-31 ENCOUNTER — Observation Stay
Admission: EM | Admit: 2015-10-31 | Discharge: 2015-10-31 | Disposition: A | Discharge status: Home / Self Care | Payer: Medicaid Other | Attending: Obstetrics & Gynecology | Admitting: Obstetrics & Gynecology

## 2015-10-31 ENCOUNTER — Encounter: Payer: Self-pay | Admitting: *Deleted

## 2015-10-31 DIAGNOSIS — O2342 Unspecified infection of urinary tract in pregnancy, second trimester: Secondary | ICD-10-CM | POA: Diagnosis not present

## 2015-10-31 DIAGNOSIS — M549 Dorsalgia, unspecified: Secondary | ICD-10-CM | POA: Diagnosis present

## 2015-10-31 DIAGNOSIS — Z3A30 30 weeks gestation of pregnancy: Secondary | ICD-10-CM | POA: Diagnosis not present

## 2015-10-31 DIAGNOSIS — O479 False labor, unspecified: Secondary | ICD-10-CM | POA: Diagnosis present

## 2015-10-31 DIAGNOSIS — R110 Nausea: Secondary | ICD-10-CM | POA: Diagnosis present

## 2015-10-31 HISTORY — DX: Mental disorder, not otherwise specified: F99

## 2015-10-31 HISTORY — DX: Depression, unspecified: F32.A

## 2015-10-31 HISTORY — DX: Major depressive disorder, single episode, unspecified: F32.9

## 2015-10-31 HISTORY — DX: Other specified behavioral and emotional disorders with onset usually occurring in childhood and adolescence: F98.8

## 2015-10-31 HISTORY — DX: Bipolar disorder, unspecified: F31.9

## 2015-10-31 HISTORY — DX: Anxiety disorder, unspecified: F41.9

## 2015-10-31 LAB — URINALYSIS COMPLETE WITH MICROSCOPIC (ARMC ONLY)
BACTERIA UA: NONE SEEN
Bilirubin Urine: NEGATIVE
Glucose, UA: 50 mg/dL — AB
HGB URINE DIPSTICK: NEGATIVE
LEUKOCYTES UA: NEGATIVE
Nitrite: NEGATIVE
PH: 6 (ref 5.0–8.0)
Protein, ur: 30 mg/dL — AB
Specific Gravity, Urine: 1.03 (ref 1.005–1.030)

## 2015-10-31 LAB — CBC
HCT: 33.3 % — ABNORMAL LOW (ref 35.0–47.0)
Hemoglobin: 11.1 g/dL — ABNORMAL LOW (ref 12.0–16.0)
MCH: 29.2 pg (ref 26.0–34.0)
MCHC: 33.2 g/dL (ref 32.0–36.0)
MCV: 88 fL (ref 80.0–100.0)
PLATELETS: 225 10*3/uL (ref 150–440)
RBC: 3.78 MIL/uL — AB (ref 3.80–5.20)
RDW: 13.8 % (ref 11.5–14.5)
WBC: 11.7 10*3/uL — AB (ref 3.6–11.0)

## 2015-10-31 MED ORDER — ACETAMINOPHEN 325 MG PO TABS: 650.0000 mg | ORAL_TABLET | ORAL | Status: DC | PRN

## 2015-10-31 MED ORDER — ONDANSETRON HCL 4 MG/2ML IJ SOLN: 4.0000 mg | Freq: Four times a day (QID) | INTRAMUSCULAR | Status: DC | PRN

## 2015-10-31 NOTE — OB Triage Note (Signed)
Complaints of abdominal pain with urination. Ammonia smelling urine per patient. No leaking fluid. Positive fetal movement. Vaginal spotting when up to the bathroom.

## 2015-10-31 NOTE — Final Progress Note (Signed)
Physician Final Progress Note  Patient ID: Holly Wagner MRN: [id number] DOB/AGE: 1991-09-09 24 y.o.  Admit date: 10/31/2015 Admitting provider: Gae Dry, MD Discharge date: 10/31/2015   Admission Diagnoses: UTI, nausea, back pain, pregnancy  Pt is a G2P1 in second trimester with h/o recent UTI treated w Omnicef antibiotic, no improvement.  Reports nausea at times, back pain along muscles, leg pain intermittantly, constipation, headache.  Good FM.  No VB, ctxs, although does report mild upper abd pains at times. Exam was pertinent for no fever, FHTs 140s, No CVAT, mild bilateral lower back tenderness on exam, no abd T, no edema. UA c/w UTI, recurrent or under-treated.   Discharge Diagnoses:  Active Problems:   * No active hospital problems. *  UTI, pregnancy second trimester  Discharge Condition: good  Disposition: 01-Home or Self Care  Diet: Regular diet  Discharge Activity: Activity as tolerated     Medication List    ASK your doctor about these medications        lurasidone 40 MG Tabs tablet  Commonly known as:  LATUDA  Take 40 mg by mouth at bedtime.     omeprazole 40 MG capsule  Commonly known as:  PRILOSEC  Take 40 mg by mouth daily.     prenatal multivitamin Tabs tablet  Take 1 tablet by mouth daily at 12 noon.     traZODone 100 MG tablet  Commonly known as:  DESYREL  Take 100 mg by mouth at bedtime.         Total time spent taking care of this patient: 15 minutes  Signed: Hoyt Koch 10/31/2015, 4:13 PM

## 2015-10-31 NOTE — Progress Notes (Signed)
Patient expressing worsening depression symptoms. Denies any thoughts or attempts of suicide in the last 30 days. States she has an appointment on Dec. 3rd with her therapist. States that it should be sufficient, but that she thought she would increase her medicine.

## 2015-12-21 DIAGNOSIS — O24419 Gestational diabetes mellitus in pregnancy, unspecified control: Secondary | ICD-10-CM | POA: Insufficient documentation

## 2015-12-22 ENCOUNTER — Emergency Department: Payer: Medicaid Other

## 2015-12-22 ENCOUNTER — Emergency Department
Admission: EM | Admit: 2015-12-22 | Discharge: 2015-12-23 | Disposition: A | Discharge status: Home / Self Care | Payer: Medicaid Other | Attending: Emergency Medicine | Admitting: Emergency Medicine

## 2015-12-22 DIAGNOSIS — R29810 Facial weakness: Secondary | ICD-10-CM | POA: Insufficient documentation

## 2015-12-22 DIAGNOSIS — O9989 Other specified diseases and conditions complicating pregnancy, childbirth and the puerperium: Secondary | ICD-10-CM | POA: Insufficient documentation

## 2015-12-22 DIAGNOSIS — Z3A37 37 weeks gestation of pregnancy: Secondary | ICD-10-CM | POA: Insufficient documentation

## 2015-12-22 DIAGNOSIS — O99513 Diseases of the respiratory system complicating pregnancy, third trimester: Secondary | ICD-10-CM | POA: Diagnosis not present

## 2015-12-22 DIAGNOSIS — R51 Headache: Secondary | ICD-10-CM | POA: Diagnosis not present

## 2015-12-22 DIAGNOSIS — Z79899 Other long term (current) drug therapy: Secondary | ICD-10-CM | POA: Diagnosis not present

## 2015-12-22 DIAGNOSIS — Z87891 Personal history of nicotine dependence: Secondary | ICD-10-CM | POA: Diagnosis not present

## 2015-12-22 DIAGNOSIS — J45909 Unspecified asthma, uncomplicated: Secondary | ICD-10-CM | POA: Diagnosis not present

## 2015-12-22 DIAGNOSIS — R519 Headache, unspecified: Secondary | ICD-10-CM

## 2015-12-22 LAB — COMPREHENSIVE METABOLIC PANEL
ALBUMIN: 2.8 g/dL — AB (ref 3.5–5.0)
ALK PHOS: 127 U/L — AB (ref 38–126)
ALT: 15 U/L (ref 14–54)
ANION GAP: 7 (ref 5–15)
AST: 18 U/L (ref 15–41)
BILIRUBIN TOTAL: 0.5 mg/dL (ref 0.3–1.2)
BUN: 10 mg/dL (ref 6–20)
CALCIUM: 8.3 mg/dL — AB (ref 8.9–10.3)
CO2: 25 mmol/L (ref 22–32)
Chloride: 105 mmol/L (ref 101–111)
Creatinine, Ser: 0.78 mg/dL (ref 0.44–1.00)
GFR calc Af Amer: >60 mL/min (ref >60)
GLUCOSE: 129 mg/dL — AB (ref 65–99)
POTASSIUM: 3.7 mmol/L (ref 3.5–5.1)
Sodium: 137 mmol/L (ref 135–145)
TOTAL PROTEIN: 6.7 g/dL (ref 6.5–8.1)

## 2015-12-22 LAB — URINALYSIS COMPLETE WITH MICROSCOPIC (ARMC ONLY)
BILIRUBIN URINE: NEGATIVE
Glucose, UA: 50 mg/dL — AB
Hgb urine dipstick: NEGATIVE
NITRITE: NEGATIVE
PH: 6 (ref 5.0–8.0)
PROTEIN: 30 mg/dL — AB
SPECIFIC GRAVITY, URINE: 1.023 (ref 1.005–1.030)

## 2015-12-22 LAB — CSF CELL COUNT WITH DIFFERENTIAL
Eosinophils, CSF: 0 %
Eosinophils, CSF: 0 %
LYMPHS CSF: 79 %
Lymphs, CSF: 72 %
MONOCYTE-MACROPHAGE-SPINAL FLUID: 21 %
MONOCYTE-MACROPHAGE-SPINAL FLUID: 28 %
Other Cells, CSF: 0
Other Cells, CSF: 0
RBC COUNT CSF: 0 /mm3 (ref 0–3)
RBC COUNT CSF: 0 /mm3 (ref 0–3)
SEGMENTED NEUTROPHILS-CSF: 0 %
Segmented Neutrophils-CSF: 0 %
TUBE #: 3
Tube #: 1
WBC CSF: 10 /mm3
WBC CSF: 18 /mm3

## 2015-12-22 LAB — CBC
HEMATOCRIT: 35.3 % (ref 35.0–47.0)
HEMOGLOBIN: 11.8 g/dL — AB (ref 12.0–16.0)
MCH: 28 pg (ref 26.0–34.0)
MCHC: 33.5 g/dL (ref 32.0–36.0)
MCV: 83.8 fL (ref 80.0–100.0)
Platelets: 212 10*3/uL (ref 150–440)
RBC: 4.21 MIL/uL (ref 3.80–5.20)
RDW: 14.2 % (ref 11.5–14.5)
WBC: 9.6 10*3/uL (ref 3.6–11.0)

## 2015-12-22 LAB — PROTEIN AND GLUCOSE, CSF
Glucose, CSF: 69 mg/dL (ref 40–70)
Total  Protein, CSF: 17 mg/dL (ref 15–45)

## 2015-12-22 MED ORDER — OXYCODONE-ACETAMINOPHEN 5-325 MG PO TABS: 2.0000 | ORAL_TABLET | Freq: Once | ORAL | Status: DC

## 2015-12-22 MED ORDER — ONDANSETRON 4 MG PO TBDP
ORAL_TABLET | ORAL | Status: AC
  Administered 2015-12-22: 4 mg via ORAL
  Filled 2015-12-22: qty 1

## 2015-12-22 MED ORDER — OXYCODONE-ACETAMINOPHEN 5-325 MG PO TABS
ORAL_TABLET | ORAL | Status: AC
  Filled 2015-12-22: qty 2

## 2015-12-22 MED ORDER — ONDANSETRON 4 MG PO TBDP
4.0000 mg | ORAL_TABLET | Freq: Once | ORAL | Status: AC
  Administered 2015-12-22: 4 mg via ORAL

## 2015-12-22 MED ORDER — MORPHINE SULFATE (PF) 2 MG/ML IV SOLN
2.0000 mg | Freq: Once | INTRAVENOUS | Status: AC
  Administered 2015-12-22: 2 mg via INTRAVENOUS

## 2015-12-22 MED ORDER — MORPHINE SULFATE (PF) 2 MG/ML IV SOLN
INTRAVENOUS | Status: AC
  Administered 2015-12-22: 2 mg via INTRAVENOUS
  Filled 2015-12-22: qty 1

## 2015-12-22 NOTE — ED Notes (Signed)
Reports having episodes off and on for a few months, when she sneezing she feels a pain in left side of head and left eye closes involuntarily and becomes confused.  Then it resolves.  Pt is [redacted] wks pregnant.

## 2015-12-22 NOTE — ED Provider Notes (Signed)
[facility] Emergency Department Provider Note   ____________________________________________  Time seen:  I have reviewed the triage vital signs and the triage nursing note.  HISTORY  Chief Complaint Headache   Historian Patient and husband  HPI Holly Wagner is a 25 y.o. female who is approximately [redacted] weeks pregnant is here for evaluation of intermittent headaches. Patient states that over the past 2 months she's had separate episodes where she has a cough or a sneeze and immediately afterward she has a severe sharp pain about her left eye that lasts for about 5 minutes. Recently she has had problems where her left eyelid with droop during this severe headache. Yesterday she had an episode where she sneezed and had this onset of severe left-sided frontal headache with left eye drooping and where she felt like she couldn't speak words for a few minutes. After that she felt sleepy for about an hour.  Please go she had bronchitis, but that seems to be improved. She's not had a fever or neck pain. No history of blood pressure problems or problems with pregnancy. Symptoms are moderate to severe and currently she is a symptomatically.    Past Medical History  Diagnosis Date  . Asthma   . Mental disorder   . Bipolar 1 disorder ([facility])   . ADD (attention deficit disorder)   . Anxiety and depression     Patient Active Problem List   Diagnosis Date Noted  . Irregular contractions 10/31/2015  . Indication for care in labor and delivery, antepartum 09/09/2015    Past Surgical History  Procedure Laterality Date  . Sinus surgery with instatrak      unsure, maybe 4 years ago  . Cholecystectomy  2007  . Wisdom tooth extraction    . Fatty tumor removal  1992    Current Outpatient Rx  Name  Route  Sig  Dispense  Refill  . lurasidone (LATUDA) 40 MG TABS tablet   Oral   Take 40 mg by mouth at bedtime.         Marland Kitchen omeprazole (PRILOSEC) 40 MG capsule    Oral   Take 40 mg by mouth daily.         . Prenatal Vit-Fe Fumarate-FA (PRENATAL MULTIVITAMIN) TABS tablet   Oral   Take 1 tablet by mouth daily at 12 noon.         . traZODone (DESYREL) 100 MG tablet   Oral   Take 100 mg by mouth at bedtime.           Allergies Morphine and related  Family History  Problem Relation Age of Onset  . Cancer Other     Social History Social History  Substance Use Topics  . Smoking status: Former Smoker -- 0.50 packs/day    Types: Cigarettes    Quit date: 04/30/2015  . Smokeless tobacco: Never Used  . Alcohol Use: No    Review of Systems  Constitutional: Negative for fever. Eyes: Negative for visual changes. ENT: Negative for sore throat. Cardiovascular: Negative for chest pain. Respiratory: Negative for shortness of breath. Gastrointestinal: Negative for abdominal pain, vomiting and diarrhea. Genitourinary: Negative for dysuria. Musculoskeletal: Negative for back pain. Skin: Negative for rash. Neurological: Negative for headache at present. 10 point Review of Systems otherwise negative ____________________________________________   PHYSICAL EXAM:  VITAL SIGNS: ED Triage Vitals  Enc Vitals Group     BP 12/22/15 1445 119/72 mmHg     Pulse Rate 12/22/15 1445 101  Resp 12/22/15 1445 18     Temp 12/22/15 1445 97.5 F (36.4 C)     Temp Source 12/22/15 1445 Oral     SpO2 12/22/15 1445 96 %     Weight 12/22/15 1445 227 lb (102.967 kg)     Height 12/22/15 1445 5\' 6"  (1.676 m)     Head Cir --      Peak Flow --      Pain Score --      Pain Loc --      Pain Edu? --      Excl. in [place]? --      Constitutional: Alert and oriented. Well appearing and in no distress. Eyes: Conjunctivae are normal. PERRL. Normal extraocular movements. ENT   Head: Normocephalic and atraumatic.   Nose: No congestion/rhinnorhea.   Mouth/Throat: Mucous membranes are moist.   Neck: No stridor. Cardiovascular/Chest: Normal rate,  regular rhythm.  No murmurs, rubs, or gallops. Respiratory: Normal respiratory effort without tachypnea nor retractions. Breath sounds are clear and equal bilaterally. No wheezes/rales/rhonchi. Gastrointestinal: Soft. No distention, no guarding, no rebound. Nontender.   Third trimester gravid Genitourinary/rectal:Deferred Musculoskeletal: Nontender with normal range of motion in all extremities. No joint effusions.  No lower extremity tenderness.  No edema. Neurologic:  Normal speech and language. Cranial nerves II through X intact. No gross or focal neurologic deficits are appreciated. Normal gait. Skin:  Skin is warm, dry and intact. No rash noted. Psychiatric: Mood and affect are normal. Speech and behavior are normal. Patient exhibits appropriate insight and judgment.  ____________________________________________   EKG I, Lisa Roca, MD, the attending physician have personally viewed and interpreted all ECGs.  None ____________________________________________  LABS (pertinent positives/negatives)  CSF tube 1 cell count 0 red blood cells, 10 white blood cells, 0 neutrophils Protein 17  Urinalysis too numerous to count squamous epithelial cells, trace ketones, 30 protein, 3+ leukocytes, 6-30 red blood cells and 2 dorsalis blood cells with rare bacteria  Comprehensive metabolic panel without significant abnormality. AST 18, aLT 15 and alkaline phosphatase 127  ____________________________________________  RADIOLOGY All Xrays were viewed by me. Imaging interpreted by Radiologist.  MRI brain without contrast:  FINDINGS: The examination had to be discontinued prior to completion due to patient's difficulty breathing while lying supine. Only axial diffusion, axial FLAIR, and sagittal T1 weighted images were obtained.  There is no evidence of acute infarct, intracranial hemorrhage, mass, midline shift, or extra-axial fluid collection. The brain is normal in signal on the  obtained sequences. Ventricles and sulci are normal.  IMPRESSION: No infarct or other acute abnormality identified on this incomplete Examination.  MRA head:  FINDINGS: The visualized distal vertebral arteries are patent and codominant. PICA, AICA, and SCA origins are patent. Basilar artery is widely patent. There are small posterior communicating arteries, left larger than right. PCAs are unremarkable.  Internal carotid arteries are patent from skullbase to carotid termini without stenosis. ACAs and MCAs are patent without evidence of significant stenosis or major branch vessel occlusion. Apparent mild MCA branch vessel irregularity is favored to be artifactual. No intracranial aneurysm is identified.  IMPRESSION: Negative head MRA. __________________________________________  PROCEDURES  Procedure(s) performed: Lumbar puncture -- performed by me.  Consent obtained after discussion of risks and benefits.  Betadine skin cleansing. L4-L5 local numbing with lidocaine with epinephrine. 20-gauge spinal needle obtained clear CSF and sent for cell counts, protein, glucose, and culture.  Critical Care performed: None  ____________________________________________   ED COURSE / ASSESSMENT AND PLAN  CONSULTATIONS: Neurology,  Dr.Reynolds -- recommended imaging with head mri and mra (rather than ct and no contrast mri due to pregnancy).  Recommends LP after negative imaging studies.  In setting of negative imaging and LP, suspect nonemergency cluster vs. Migraine headache and recommends outpatient neurology follow up.  Pertinent labs & imaging results that were available during my care of the patient were reviewed by me and considered in my medical decision making (see chart for details).  Patient's severe headache after increased ICP episodes of coughing or sneezing are somewhat concerning for possible intracranial emergency, most concerning possibly being the need to rule out  subarachnoid hemorrhage. After discussion with the neurologist, Dr. Doy Mince, decided upon MRI and MRA.  These were negative for intracranial abnormality such as vasculitis, clot, hemorrhage, tumor.  The patient did have an episode here in the emergency department where he did witness her left eyelid somewhat drooping during a five-minute episode where she was having the headache after sneezing. This then resolved.  LP performed, and appeared clear. Patient did have a headache that we treated with a small dose of morphine after the LP.  CSF shows 0 red blood cells.  Discussed with neurologist, Dr. Doy Mince, okay for discharge and outpatient follow-up.  Headache is very intermittent, and not really suspicious of preeclampsia, and her blood pressures are normal.  I discussed with Dr. Star Age, trace proteinuria, and normal blood pressure, does not suspect preeclamptic. We also discussed fetal heart tone of 120, adequate/reassuring.    Patient / Family / Caregiver informed of clinical course, medical decision-making process, and agree with plan.   I discussed return precautions, follow-up instructions, and discharged instructions with patient and/or family.  ___________________________________________   FINAL CLINICAL IMPRESSION(S) / ED DIAGNOSES   Final diagnoses:  Acute headache              Note: This dictation was prepared with Dragon dictation. Any transcriptional errors that result from this process are unintentional   Lisa Roca, MD 12/22/15 2351

## 2015-12-22 NOTE — Discharge Instructions (Signed)
°  You were evaluated for headache with facial tingling, and although no certain cause was found, your exam and evaluation are reassuring in the emergency department tonight.  Return to the emergency department for any worsening condition including fever, worsening headache, new weakness or numbness, passing out, or any other symptoms concerning to you. We discussed I recommend you follow-up with a neurologist.   General Headache Without Cause A headache is pain or discomfort felt around the head or neck area. There are many causes and types of headaches. In some cases, the cause may not be found.  HOME CARE  Managing Pain  Take over-the-counter and prescription medicines only as told by your doctor.  Lie down in a dark, quiet room when you have a headache.  If directed, apply ice to the head and neck area:  Put ice in a plastic bag.  Place a towel between your skin and the bag.  Leave the ice on for 20 minutes, 2-3 times per day.  Use a heating pad or hot shower to apply heat to the head and neck area as told by your doctor.  Keep lights dim if bright lights bother you or make your headaches worse. Eating and Drinking  Eat meals on a regular schedule.  Lessen how much alcohol you drink.  Lessen how much caffeine you drink, or stop drinking caffeine. General Instructions  Keep all follow-up visits as told by your doctor. This is important.  Keep a journal to find out if certain things bring on headaches. For example, write down:  What you eat and drink.  How much sleep you get.  Any change to your diet or medicines.  Relax by getting a massage or doing other relaxing activities.  Lessen stress.  Sit up straight. Do not tighten (tense) your muscles.  Do not use tobacco products. This includes cigarettes, chewing tobacco, or e-cigarettes. If you need help quitting, ask your doctor.  Exercise regularly as told by your doctor.  Get enough sleep. This often means 7-9  hours of sleep. GET HELP IF:  Your symptoms are not helped by medicine.  You have a headache that feels different than the other headaches.  You feel sick to your stomach (nauseous) or you throw up (vomit).  You have a fever. GET HELP RIGHT AWAY IF:   Your headache becomes really bad.  You keep throwing up.  You have a stiff neck.  You have trouble seeing.  You have trouble speaking.  You have pain in the eye or ear.  Your muscles are weak or you lose muscle control.  You lose your balance or have trouble walking.  You feel like you will pass out (faint) or you pass out.  You have confusion.   This information is not intended to replace advice given to you by your health care provider. Make sure you discuss any questions you have with your health care provider.   Document Released: 09/10/2008 Document Revised: 08/23/2015 Document Reviewed: 03/27/2015 Elsevier Interactive Patient Education [employer].

## 2015-12-22 NOTE — ED Notes (Signed)
In attendance with Dr. Reita Cliche performing LP

## 2015-12-22 NOTE — ED Notes (Addendum)
Pt states had bronchitis a couple of months ago. Pt states has been sneezing frequently for a week. Pt states when she sneezes she has a "tremendous" pain on the left side of her forehead. Pt states when this occurs it feels like she can not keep her eyes open, states she has difficulty forming words. Pt states this occurred last night and lasted about an hour. Pt states today when she was driving she felt her head dropping. Pt states she has had some bloody nasal drainage from the left nostril. Pt is [redacted] weeks pregnant.

## 2015-12-22 NOTE — ED Notes (Signed)
Patient transported to MRI 

## 2015-12-23 NOTE — ED Notes (Signed)
Reviewed d/c instructions and follow-up care with pt. Pt verbalized understanding. Pt reported she would schedule follow-up appointment with PCP on Monday when office opens.

## 2015-12-24 DIAGNOSIS — O99820 Streptococcus B carrier state complicating pregnancy: Secondary | ICD-10-CM | POA: Insufficient documentation

## 2015-12-24 LAB — URINE CULTURE

## 2015-12-26 LAB — CSF CULTURE: CULTURE: NO GROWTH

## 2015-12-26 LAB — CSF CULTURE W GRAM STAIN: Gram Stain: NONE SEEN

## 2015-12-28 DIAGNOSIS — O35EXX0 Maternal care for other (suspected) fetal abnormality and damage, fetal genitourinary anomalies, not applicable or unspecified: Secondary | ICD-10-CM | POA: Insufficient documentation

## 2016-01-08 DIAGNOSIS — F3170 Bipolar disorder, currently in remission, most recent episode unspecified: Secondary | ICD-10-CM | POA: Insufficient documentation

## 2016-01-08 DIAGNOSIS — Z3009 Encounter for other general counseling and advice on contraception: Secondary | ICD-10-CM | POA: Insufficient documentation

## 2016-05-18 ENCOUNTER — Emergency Department: Payer: Medicaid Other

## 2016-05-18 ENCOUNTER — Emergency Department
Admission: EM | Admit: 2016-05-18 | Discharge: 2016-05-18 | Disposition: A | Discharge status: Home / Self Care | Payer: Medicaid Other | Attending: Emergency Medicine | Admitting: Emergency Medicine

## 2016-05-18 DIAGNOSIS — Z87891 Personal history of nicotine dependence: Secondary | ICD-10-CM | POA: Diagnosis not present

## 2016-05-18 DIAGNOSIS — Y9301 Activity, walking, marching and hiking: Secondary | ICD-10-CM | POA: Insufficient documentation

## 2016-05-18 DIAGNOSIS — F329 Major depressive disorder, single episode, unspecified: Secondary | ICD-10-CM | POA: Diagnosis not present

## 2016-05-18 DIAGNOSIS — Y929 Unspecified place or not applicable: Secondary | ICD-10-CM | POA: Insufficient documentation

## 2016-05-18 DIAGNOSIS — S99911A Unspecified injury of right ankle, initial encounter: Secondary | ICD-10-CM | POA: Diagnosis present

## 2016-05-18 DIAGNOSIS — S93401A Sprain of unspecified ligament of right ankle, initial encounter: Secondary | ICD-10-CM

## 2016-05-18 DIAGNOSIS — Y999 Unspecified external cause status: Secondary | ICD-10-CM | POA: Diagnosis not present

## 2016-05-18 DIAGNOSIS — X501XXA Overexertion from prolonged static or awkward postures, initial encounter: Secondary | ICD-10-CM | POA: Diagnosis not present

## 2016-05-18 DIAGNOSIS — J45909 Unspecified asthma, uncomplicated: Secondary | ICD-10-CM | POA: Insufficient documentation

## 2016-05-18 MED ORDER — IBUPROFEN 600 MG PO TABS: 600.0000 mg | ORAL_TABLET | Freq: Three times a day (TID) | ORAL | Status: DC | PRN

## 2016-05-18 MED ORDER — TRAMADOL HCL 50 MG PO TABS: 50.0000 mg | ORAL_TABLET | Freq: Four times a day (QID) | ORAL | Status: DC | PRN

## 2016-05-18 NOTE — ED Provider Notes (Signed)
[facility] Emergency Department Provider Note   ____________________________________________  Time seen: Approximately 9:44 PM  I have reviewed the triage vital signs and the nursing notes.   HISTORY  Chief Complaint Ankle Pain    HPI Holly Wagner is a 25 y.o. female patient complaining of right lateral ankle pain secondary to a twisting incident which occurred 8 hours ago. Patient states she was walking on some gravel wearing sandals when she rolled her ankle. Patient state on the fall she also stepped on the ankle. Patient states she felt a "popping sensation". Patient state pain increases with ambulation and weightbearing. No palliative measures taken for this complaint.   Past Medical History  Diagnosis Date  . Asthma   . Mental disorder   . Bipolar 1 disorder ([place])   . ADD (attention deficit disorder)   . Anxiety and depression     Patient Active Problem List   Diagnosis Date Noted  . Irregular contractions 10/31/2015  . Indication for care in labor and delivery, antepartum 09/09/2015    Past Surgical History  Procedure Laterality Date  . Sinus surgery with instatrak      unsure, maybe 4 years ago  . Cholecystectomy  2007  . Wisdom tooth extraction    . Fatty tumor removal  1992    Current Outpatient Rx  Name  Route  Sig  Dispense  Refill  . ibuprofen (ADVIL,MOTRIN) 600 MG tablet   Oral   Take 1 tablet (600 mg total) by mouth every 8 (eight) hours as needed.   15 tablet   0   . lurasidone (LATUDA) 40 MG TABS tablet   Oral   Take 40 mg by mouth at bedtime.         Marland Kitchen omeprazole (PRILOSEC) 40 MG capsule   Oral   Take 40 mg by mouth daily.         . Prenatal Vit-Fe Fumarate-FA (PRENATAL MULTIVITAMIN) TABS tablet   Oral   Take 1 tablet by mouth daily at 12 noon.         . traMADol (ULTRAM) 50 MG tablet   Oral   Take 1 tablet (50 mg total) by mouth every 6 (six) hours as needed for moderate pain.   12 tablet    0   . traZODone (DESYREL) 100 MG tablet   Oral   Take 100 mg by mouth at bedtime.           Allergies Morphine and related  Family History  Problem Relation Age of Onset  . Cancer Other     Social History Social History  Substance Use Topics  . Smoking status: Former Smoker -- 0.50 packs/day    Types: Cigarettes    Quit date: 04/30/2015  . Smokeless tobacco: Never Used  . Alcohol Use: No    Review of Systems Constitutional: No fever/chills Eyes: No visual changes. ENT: No sore throat. Cardiovascular: Denies chest pain. Respiratory: Denies shortness of breath. Gastrointestinal: No abdominal pain.  No nausea, no vomiting.  No diarrhea.  No constipation. Genitourinary: Negative for dysuria. Musculoskeletal: Right ankle pain Skin: Negative for rash. Neurological: Negative for headaches, focal weakness or numbness. Psychiatric:Anxiety, depression, bipolar, ADD. Allergic/Immunilogical: Morphine and related products __________________________________________   PHYSICAL EXAM:  VITAL SIGNS: ED Triage Vitals  Enc Vitals Group     BP --      Pulse --      Resp --      Temp --  Temp src --      SpO2 --      Weight --      Height --      Head Cir --      Peak Flow --      Pain Score --      Pain Loc --      Pain Edu? --      Excl. in [place]? --     Constitutional: Alert and oriented. Well appearing and in no acute distress. Eyes: Conjunctivae are normal. PERRL. EOMI. Head: Atraumatic. Nose: No congestion/rhinnorhea. Mouth/Throat: Mucous membranes are moist.  Oropharynx non-erythematous. Neck: No stridor.  No cervical spine tenderness to palpation. Hematological/Lymphatic/Immunilogical: No cervical lymphadenopathy. Cardiovascular: Normal rate, regular rhythm. Grossly normal heart sounds.  Good peripheral circulation. Respiratory: Normal respiratory effort.  No retractions. Lungs CTAB. Gastrointestinal: Soft and nontender. No distention. No abdominal bruits.  No CVA tenderness. Musculoskeletal: No lower extremity tenderness nor edema.  No joint effusions. Neurologic:  Normal speech and language. No gross focal neurologic deficits are appreciated. No gait instability. Skin:  Skin is warm, dry and intact. No rash noted. Psychiatric: Mood and affect are normal. Speech and behavior are normal.  ____________________________________________   LABS (all labs ordered are listed, but only abnormal results are displayed)  Labs Reviewed - No data to display ____________________________________________  EKG   ____________________________________________  RADIOLOGY  No acute findings x-ray of the right ankle. ____________________________________________   PROCEDURES  Procedure(s) performed: None  Critical Care performed: No  ____________________________________________   INITIAL IMPRESSION / ASSESSMENT AND PLAN / ED COURSE  Pertinent labs & imaging results that were available during my care of the patient were reviewed by me and considered in my medical decision making (see chart for details).  Right ankle sprain. Discussed x-ray finding with patient. Patient given discharge Instructions. Patient given a ankle splint. Patient given a prescription for tramadol and ibuprofen. Patient [facility]. ____________________________________________   FINAL CLINICAL IMPRESSION(S) / ED DIAGNOSES  Final diagnoses:  Sprain of right ankle, initial encounter      NEW MEDICATIONS STARTED DURING THIS VISIT:  New Prescriptions   IBUPROFEN (ADVIL,MOTRIN) 600 MG TABLET    Take 1 tablet (600 mg total) by mouth every 8 (eight) hours as needed.   TRAMADOL (ULTRAM) 50 MG TABLET    Take 1 tablet (50 mg total) by mouth every 6 (six) hours as needed for moderate pain.     Note:  This document was prepared using Dragon voice recognition software and may include unintentional dictation  errors.    Sable Feil, PA-C 05/18/16 [id number]  Daymon Larsen, MD 05/19/16 [phone]

## 2016-05-18 NOTE — ED Notes (Signed)
Pt sts that she twisted ankle while walking today, sts she heard crunch. Pulses palpable, sensation intact.  NAD

## 2016-05-18 NOTE — Discharge Instructions (Signed)
Respiratory for 3-5 days as needed

## 2016-05-18 NOTE — ED Notes (Signed)
E sig pad not working, pt verbalized understanding 

## 2016-08-12 ENCOUNTER — Emergency Department
Admission: EM | Admit: 2016-08-12 | Discharge: 2016-08-12 | Disposition: A | Discharge status: Home / Self Care | Payer: Medicaid Other | Attending: Student in an Organized Health Care Education/Training Program | Admitting: Student in an Organized Health Care Education/Training Program

## 2016-08-12 ENCOUNTER — Emergency Department: Payer: Medicaid Other

## 2016-08-12 ENCOUNTER — Encounter: Payer: Self-pay | Admitting: Emergency Medicine

## 2016-08-12 DIAGNOSIS — Y929 Unspecified place or not applicable: Secondary | ICD-10-CM | POA: Diagnosis not present

## 2016-08-12 DIAGNOSIS — X501XXA Overexertion from prolonged static or awkward postures, initial encounter: Secondary | ICD-10-CM | POA: Insufficient documentation

## 2016-08-12 DIAGNOSIS — J45909 Unspecified asthma, uncomplicated: Secondary | ICD-10-CM | POA: Insufficient documentation

## 2016-08-12 DIAGNOSIS — F909 Attention-deficit hyperactivity disorder, unspecified type: Secondary | ICD-10-CM | POA: Diagnosis not present

## 2016-08-12 DIAGNOSIS — S93602A Unspecified sprain of left foot, initial encounter: Secondary | ICD-10-CM | POA: Diagnosis not present

## 2016-08-12 DIAGNOSIS — Z87891 Personal history of nicotine dependence: Secondary | ICD-10-CM | POA: Insufficient documentation

## 2016-08-12 DIAGNOSIS — S99922A Unspecified injury of left foot, initial encounter: Secondary | ICD-10-CM | POA: Diagnosis present

## 2016-08-12 DIAGNOSIS — S93601A Unspecified sprain of right foot, initial encounter: Secondary | ICD-10-CM

## 2016-08-12 DIAGNOSIS — Y939 Activity, unspecified: Secondary | ICD-10-CM | POA: Insufficient documentation

## 2016-08-12 DIAGNOSIS — Y999 Unspecified external cause status: Secondary | ICD-10-CM | POA: Insufficient documentation

## 2016-08-12 MED ORDER — HYDROCODONE-ACETAMINOPHEN 5-325 MG PO TABS
1.0000 | ORAL_TABLET | ORAL | Status: AC
  Administered 2016-08-12: 1 via ORAL
  Filled 2016-08-12: qty 1

## 2016-08-12 NOTE — ED Notes (Signed)
Pt present with left top of foot pain. Pt unable to put weight on left foot. Pt states was ambulating and twisted ankle towards lateral side of left foot about 18:30 tonight. Mild swelling noted on lateral part of ankle, no obvious deformity noted. <3 sec cap refill good pulses.

## 2016-08-12 NOTE — ED Notes (Signed)
Discharge instructions reviewed with patient. Questions fielded by this RN. Patient verbalizes understanding of instructions. Patient discharged home in stable condition per [facility] . No acute distress noted at time of discharge.

## 2016-08-12 NOTE — ED Provider Notes (Signed)
[facility] Provider Note   CSN: [id number] Arrival date & time: 08/12/16  1902     History   Chief Complaint Chief Complaint  Patient presents with  . Foot Pain    HPI Holly Wagner is a 25 y.o. female presents to emergency for evaluation of left foot pain. Patient stated 6:30 AM she rolled her left foot, inverting the foot off of the curb. She describes lateral foot pain along the base of the fifth metatarsal. She has some swelling. Pain is 5 out of 10. Ibuprofen 600 mg one hour ago with minimal improvement. She is unable to bear weight. She denies any other injuries to her body.  HPI  Past Medical History:  Diagnosis Date  . ADD (attention deficit disorder)   . Anxiety and depression   . Asthma   . Bipolar 1 disorder ([place])   . Mental disorder     Patient Active Problem List   Diagnosis Date Noted  . Irregular contractions 10/31/2015  . Indication for care in labor and delivery, antepartum 09/09/2015    Past Surgical History:  Procedure Laterality Date  . CHOLECYSTECTOMY  2007  . fatty tumor removal  1992  . SINUS SURGERY WITH INSTATRAK     unsure, maybe 4 years ago  . WISDOM TOOTH EXTRACTION      OB History    Gravida Para Term Preterm AB Living   2 1 1     1    SAB TAB Ectopic Multiple Live Births                   Home Medications    Prior to Admission medications   Medication Sig Start Date End Date Taking? Authorizing Provider  ibuprofen (ADVIL,MOTRIN) 600 MG tablet Take 1 tablet (600 mg total) by mouth every 8 (eight) hours as needed. 05/18/16   Sable Feil, PA-C  lurasidone (LATUDA) 40 MG TABS tablet Take 40 mg by mouth at bedtime.    Historical Provider, MD  omeprazole (PRILOSEC) 40 MG capsule Take 40 mg by mouth daily.    Historical Provider, MD  Prenatal Vit-Fe Fumarate-FA (PRENATAL MULTIVITAMIN) TABS tablet Take 1 tablet by mouth daily at 12 noon.    Historical Provider, MD  traMADol (ULTRAM) 50 MG tablet Take 1 tablet (50  mg total) by mouth every 6 (six) hours as needed for moderate pain. 05/18/16   Sable Feil, PA-C  traZODone (DESYREL) 100 MG tablet Take 100 mg by mouth at bedtime.    Historical Provider, MD    Family History Family History  Problem Relation Age of Onset  . Cancer Other     Social History Social History  Substance Use Topics  . Smoking status: Former Smoker    Packs/day: 0.50    Types: Cigarettes    Quit date: 04/30/2015  . Smokeless tobacco: Never Used  . Alcohol use No     Allergies   Morphine and related   Review of Systems Review of Systems  Constitutional: Negative for activity change, chills, fatigue and fever.  HENT: Negative for congestion, sinus pressure and sore throat.   Eyes: Negative for visual disturbance.  Respiratory: Negative for cough, chest tightness and shortness of breath.   Cardiovascular: Negative for chest pain and leg swelling.  Gastrointestinal: Negative for abdominal pain, diarrhea, nausea and vomiting.  Genitourinary: Negative for dysuria.  Musculoskeletal: Positive for arthralgias, gait problem and joint swelling.  Skin: Negative for rash.  Neurological: Negative for weakness, numbness and headaches.  Hematological: Negative for adenopathy.  Psychiatric/Behavioral: Negative for agitation, behavioral problems and confusion.     Physical Exam Updated Vital Signs BP 121/79 (BP Location: Left Arm)   Pulse 76   Temp 98.4 F (36.9 C) (Oral)   Resp 18   Ht 5\' 6"  (1.676 m)   Wt 90.7 kg   LMP 08/12/2016   SpO2 96%   BMI 32.28 kg/m   Physical Exam  Constitutional: She is oriented to person, place, and time. She appears well-developed and well-nourished. No distress.  HENT:  Head: Normocephalic and atraumatic.  Mouth/Throat: Oropharynx is clear and moist.  Eyes: EOM are normal. Pupils are equal, round, and reactive to light. Right eye exhibits no discharge. Left eye exhibits no discharge.  Neck: Normal range of motion. Neck supple.    Cardiovascular: Normal rate, regular rhythm and intact distal pulses.   Pulmonary/Chest: Effort normal and breath sounds normal. No respiratory distress. She exhibits no tenderness.  Musculoskeletal:  Examination of the left foot shows patient has minimal swelling, no ecchymosis. No skin breakdown noted. She is tender over the base of fifth metatarsal. She is nontender over the medial or lateral malleolus. No deficits with active ankle plantarflexion dorsiflexion.  Neurological: She is alert and oriented to person, place, and time. She has normal reflexes.  Skin: Skin is warm and dry.  Psychiatric: She has a normal mood and affect. Her behavior is normal. Thought content normal.     ED Treatments / Results  Labs (all labs ordered are listed, but only abnormal results are displayed) Labs Reviewed - No data to display  EKG  EKG Interpretation None       Radiology Dg Foot Complete Left  Result Date: 08/12/2016 CLINICAL DATA:  24 year old female with pain on the top of the left foot, unable to bear weight on the left foot. Twisting injury toward the lateral aspect of the left foot at 6:30 p.m. today. EXAM: LEFT FOOT - COMPLETE 3+ VIEW COMPARISON:  No priors. FINDINGS: There is no evidence of fracture or dislocation. There is no evidence of arthropathy or other focal bone abnormality. Soft tissues are unremarkable. IMPRESSION: Negative. Electronically Signed   By: Vinnie Langton M.D.   On: 08/12/2016 20:00    Procedures Procedures (including critical care time) SPLINT APPLICATION Date/Time: [id number] PM Authorized by: Feliberto Gottron Consent: Verbal consent obtained. Risks and benefits: risks, benefits and alternatives were discussed Consent given by: patient Splint applied by: ED tech Location details: Left foot  Splint type: Ace wrap, postop shoe  Supplies used: Ace wrap, postop shoe, crutches  Post-procedure: The splinted body part was neurovascularly unchanged  following the procedure. Patient tolerance: Patient tolerated the procedure well with no immediate complications.     Medications Ordered in ED Medications  HYDROcodone-acetaminophen (NORCO/VICODIN) 5-325 MG per tablet 1 tablet (not administered)     Initial Impression / Assessment and Plan / ED Course  I have reviewed the triage vital signs and the nursing notes.  Pertinent labs & imaging results that were available during my care of the patient were reviewed by me and considered in my medical decision making (see chart for details).  Clinical Course    25 year old female with left foot sprain. X-ray showed no evidence of acute bony abnormality. Physical examination the ankle is unremarkable. She is given crutches, Ace wrap and postop shoe. She'll follow-up with orthopedics/podiatry in 3-4 days if no improvement. She'll rest ice and elevate and take Tylenol as needed for pain.  Final  Clinical Impressions(s) / ED Diagnoses   Final diagnoses:  Foot sprain, right, initial encounter    New Prescriptions New Prescriptions   No medications on file     Duanne Guess, PA-C 08/12/16 2025    Merlyn Lot, MD 08/12/16 2034

## 2016-08-12 NOTE — ED Triage Notes (Signed)
Pt reports was ambulating and twisted ankle about 18:30 tonight. Pt reports increased pain when bearing weight to foot. Mild swelling noted on lateral part of ankle, no obvious deformity noted.

## 2016-08-20 ENCOUNTER — Encounter: Payer: Self-pay | Admitting: Emergency Medicine

## 2016-08-20 DIAGNOSIS — Z5181 Encounter for therapeutic drug level monitoring: Secondary | ICD-10-CM | POA: Insufficient documentation

## 2016-08-20 DIAGNOSIS — Z87891 Personal history of nicotine dependence: Secondary | ICD-10-CM | POA: Diagnosis not present

## 2016-08-20 DIAGNOSIS — J45909 Unspecified asthma, uncomplicated: Secondary | ICD-10-CM | POA: Diagnosis not present

## 2016-08-20 DIAGNOSIS — F909 Attention-deficit hyperactivity disorder, unspecified type: Secondary | ICD-10-CM | POA: Diagnosis not present

## 2016-08-20 DIAGNOSIS — R51 Headache: Secondary | ICD-10-CM | POA: Diagnosis present

## 2016-08-20 LAB — CBC
HEMATOCRIT: 38.8 % (ref 35.0–47.0)
HEMOGLOBIN: 13.4 g/dL (ref 12.0–16.0)
MCH: 27.7 pg (ref 26.0–34.0)
MCHC: 34.5 g/dL (ref 32.0–36.0)
MCV: 80.3 fL (ref 80.0–100.0)
PLATELETS: 333 10*3/uL (ref 150–440)
RBC: 4.84 MIL/uL (ref 3.80–5.20)
RDW: 14.7 % — ABNORMAL HIGH (ref 11.5–14.5)
WBC: 10.5 10*3/uL (ref 3.6–11.0)

## 2016-08-20 LAB — POCT PREGNANCY, URINE: Preg Test, Ur: NEGATIVE

## 2016-08-20 NOTE — ED Notes (Signed)
Spoke with Dr Alfred Levins regarding pt's symptoms; orders obtained for labs only

## 2016-08-20 NOTE — ED Triage Notes (Signed)
Patient ambulatory to triage with steady gait, without difficulty or distress noted; pt reports over last several months has had intermittent headache started base of skull radiating up into forehead accomp by left sided facial droop; denies c/o at present but st was seen for same here when pregnant but was not able to have full testing due to such

## 2016-08-21 ENCOUNTER — Emergency Department
Admission: EM | Admit: 2016-08-21 | Discharge: 2016-08-21 | Disposition: A | Discharge status: Home / Self Care | Payer: Medicaid Other | Attending: Emergency Medicine | Admitting: Emergency Medicine

## 2016-08-21 DIAGNOSIS — R519 Headache, unspecified: Secondary | ICD-10-CM

## 2016-08-21 DIAGNOSIS — R51 Headache: Secondary | ICD-10-CM

## 2016-08-21 LAB — COMPREHENSIVE METABOLIC PANEL
ALK PHOS: 77 U/L (ref 38–126)
ALT: 31 U/L (ref 14–54)
AST: 27 U/L (ref 15–41)
Albumin: 4.2 g/dL (ref 3.5–5.0)
Anion gap: 8 (ref 5–15)
BILIRUBIN TOTAL: 0.5 mg/dL (ref 0.3–1.2)
BUN: 18 mg/dL (ref 6–20)
CALCIUM: 8.7 mg/dL — AB (ref 8.9–10.3)
CO2: 27 mmol/L (ref 22–32)
CREATININE: 1.16 mg/dL — AB (ref 0.44–1.00)
Chloride: 104 mmol/L (ref 101–111)
Glucose, Bld: 105 mg/dL — ABNORMAL HIGH (ref 65–99)
Potassium: 4 mmol/L (ref 3.5–5.1)
Sodium: 139 mmol/L (ref 135–145)
TOTAL PROTEIN: 7.5 g/dL (ref 6.5–8.1)

## 2016-08-21 LAB — URINE DRUG SCREEN, QUALITATIVE (ARMC ONLY)
AMPHETAMINES, UR SCREEN: NOT DETECTED
BARBITURATES, UR SCREEN: NOT DETECTED
BENZODIAZEPINE, UR SCRN: POSITIVE — AB
COCAINE METABOLITE, UR ~~LOC~~: NOT DETECTED
Cannabinoid 50 Ng, Ur ~~LOC~~: NOT DETECTED
MDMA (Ecstasy)Ur Screen: NOT DETECTED
METHADONE SCREEN, URINE: NOT DETECTED
Opiate, Ur Screen: NOT DETECTED
Phencyclidine (PCP) Ur S: NOT DETECTED
TRICYCLIC, UR SCREEN: NOT DETECTED

## 2016-08-21 MED ORDER — IBUPROFEN 600 MG PO TABS
600.0000 mg | ORAL_TABLET | Freq: Once | ORAL | Status: AC
  Administered 2016-08-21: 600 mg via ORAL
  Filled 2016-08-21: qty 1

## 2016-08-21 MED ORDER — METOCLOPRAMIDE HCL 10 MG PO TABS
10.0000 mg | ORAL_TABLET | Freq: Once | ORAL | Status: AC
  Administered 2016-08-21: 10 mg via ORAL
  Filled 2016-08-21: qty 1

## 2016-08-21 NOTE — ED Notes (Signed)

## 2016-08-21 NOTE — Discharge Instructions (Signed)

## 2016-08-21 NOTE — ED Provider Notes (Signed)
[facility] Emergency Department Provider Note  ____________________________________________  Time seen: Approximately 3:31 AM  I have reviewed the triage vital signs and the nursing notes.   HISTORY  Chief Complaint Headache   HPI Holly Wagner is a 25 y.o. female a history of bipolar, anxiety, ADD, and asthma who presents for evaluation of a headache. Patient reports that for the last year and a half to 2 years she has episodes where she sneezes, coughs, or vomits and she develops a frontal headache that is associated with the bilateral eyelid droop. The headache usually lasts for about 30 minutes and resolves without intervention. The droop of her eyelids also resolve in the right headaches resolved. She denies slurred speech, difficulty walking, weakness or numbness of her extremities. She has had multiple MRIs and CT scans that were negative. She was referred to see a neurologist but never followed through. This morning she reports that she had a rough night and took some caffeine pills to help her stay awake and after that felt nauseous and had an episode of vomiting which prompted a headache with the droop in her eyelids. She reports that this episode lasted longer which prompted her visit to the emergency room. At this time she endorses a very mild frontal headache no changes in vision, no facial droop, no neck stiffness, no rash, no fever, no slurred speech, no gait difficulties, no weakness or numbness of her extremities.  Past Medical History:  Diagnosis Date  . ADD (attention deficit disorder)   . Anxiety and depression   . Asthma   . Bipolar 1 disorder ([place])   . Mental disorder     Patient Active Problem List   Diagnosis Date Noted  . Irregular contractions 10/31/2015  . Indication for care in labor and delivery, antepartum 09/09/2015    Past Surgical History:  Procedure Laterality Date  . CHOLECYSTECTOMY  2007  . fatty tumor removal   1992  . SINUS SURGERY WITH INSTATRAK     unsure, maybe 4 years ago  . WISDOM TOOTH EXTRACTION      Prior to Admission medications   Medication Sig Start Date End Date Taking? Authorizing Provider  ibuprofen (ADVIL,MOTRIN) 600 MG tablet Take 1 tablet (600 mg total) by mouth every 8 (eight) hours as needed. 05/18/16   Sable Feil, PA-C  lurasidone (LATUDA) 40 MG TABS tablet Take 40 mg by mouth at bedtime.    Historical Provider, MD  omeprazole (PRILOSEC) 40 MG capsule Take 40 mg by mouth daily.    Historical Provider, MD  Prenatal Vit-Fe Fumarate-FA (PRENATAL MULTIVITAMIN) TABS tablet Take 1 tablet by mouth daily at 12 noon.    Historical Provider, MD  traMADol (ULTRAM) 50 MG tablet Take 1 tablet (50 mg total) by mouth every 6 (six) hours as needed for moderate pain. 05/18/16   Sable Feil, PA-C  traZODone (DESYREL) 100 MG tablet Take 100 mg by mouth at bedtime.    Historical Provider, MD    Allergies Morphine and related  Family History  Problem Relation Age of Onset  . Cancer Other     Social History Social History  Substance Use Topics  . Smoking status: Former Smoker    Packs/day: 0.50    Types: Cigarettes    Quit date: 04/30/2015  . Smokeless tobacco: Never Used  . Alcohol use No    Review of Systems  Constitutional: Negative for fever. Eyes: Negative for visual changes. ENT: Negative for sore throat. Cardiovascular:  Negative for chest pain. Respiratory: Negative for shortness of breath. Gastrointestinal: Negative for abdominal pain, vomiting or diarrhea. Genitourinary: Negative for dysuria. Musculoskeletal: Negative for back pain. Skin: Negative for rash. Neurological: Negative for weakness or numbness. + HA and b/l eyelid droop  ____________________________________________   PHYSICAL EXAM:  VITAL SIGNS: ED Triage Vitals  Enc Vitals Group     BP 08/20/16 2332 127/77     Pulse Rate 08/20/16 2332 77     Resp 08/20/16 2332 18     Temp 08/20/16 2332 97.9 F  (36.6 C)     Temp Source 08/20/16 2332 Oral     SpO2 08/20/16 2332 98 %     Weight 08/20/16 2312 200 lb (90.7 kg)     Height 08/20/16 2312 5\' 6"  (1.676 m)     Head Circumference --      Peak Flow --      Pain Score --      Pain Loc --      Pain Edu? --      Excl. in [place]? --     Constitutional: Alert and oriented. Well appearing and in no apparent distress. HEENT:      Head: Normocephalic and atraumatic.         Eyes: Conjunctivae are normal. Sclera is non-icteric. EOMI. PERRL      Mouth/Throat: Mucous membranes are moist.       Neck: Supple with no signs of meningismus. Cardiovascular: Regular rate and rhythm. No murmurs, gallops, or rubs. 2+ symmetrical distal pulses are present in all extremities. No JVD. Respiratory: Normal respiratory effort. Lungs are clear to auscultation bilaterally. No wheezes, crackles, or rhonchi.  Musculoskeletal: Nontender with normal range of motion in all extremities. No edema, cyanosis, or erythema of extremities. Neurologic: Normal speech and language. A & O x3, PERRL, no nystagmus, CN II-XII intact, motor testing reveals good tone and bulk throughout. There is no evidence of pronator drift or dysmetria. Muscle strength is 5/5 throughout. Deep tendon reflexes are 2+ throughout with downgoing toes. Sensory examination is intact. Gait is normal. Skin: Skin is warm, dry and intact. No rash noted. Psychiatric: Mood and affect are normal. Speech and behavior are normal.  ____________________________________________   LABS (all labs ordered are listed, but only abnormal results are displayed)  Labs Reviewed  CBC - Abnormal; Notable for the following:       Result Value   RDW 14.7 (*)    All other components within normal limits  URINE DRUG SCREEN, QUALITATIVE ([facility] ONLY) - Abnormal; Notable for the following:    Benzodiazepine, Ur Scrn POSITIVE (*)    All other components within normal limits  COMPREHENSIVE METABOLIC PANEL - Abnormal; Notable for the  following:    Glucose, Bld 105 (*)    Creatinine, Ser 1.16 (*)    Calcium 8.7 (*)    All other components within normal limits  URINALYSIS COMPLETEWITH MICROSCOPIC ([facility] ONLY)  POC URINE PREG, ED  POCT PREGNANCY, URINE   ____________________________________________  EKG  none ____________________________________________  RADIOLOGY  none  ____________________________________________   PROCEDURES  Procedure(s) performed: None Procedures Critical Care performed:  None ____________________________________________   INITIAL IMPRESSION / ASSESSMENT AND PLAN / ED COURSE  25 y.o. female a history of bipolar, anxiety, ADD, and asthma who presents for evaluation of a headache. Patient reports that for the last year and a half to 2 years she has episodes where she sneezes, coughs, or vomits and she develops a frontal headache that is associated  with the bilateral eyelid droop. He had an episode earlier this morning which prompted her visit to the emergency room. Patient has had multiple MRIs and CT scans with no findings. She hasn't been able to follow-up with neurology. She currently has a very mild headache, she is neurologically intact. I discussed with the patient that I don't believe pursuing further imaging at this time will benefit since she has had multiple negative images for similar presentations. I have encouraged her to follow-up with neurology and I will provide her with the phone number of [facility] and [place] [facility] for follow-up. I also encouraged her to follow up with her primary care doctor in case she needs an official referral to see a neurologist.   Clinical Course    Pertinent labs & imaging results that were available during my care of the patient were reviewed by me and considered in my medical decision making (see chart for details).    ____________________________________________   FINAL CLINICAL IMPRESSION(S) / ED DIAGNOSES  Final diagnoses:  Acute  nonintractable headache, unspecified headache type      NEW MEDICATIONS STARTED DURING THIS VISIT:  New Prescriptions   No medications on file     Note:  This document was prepared using Dragon voice recognition software and may include unintentional dictation errors.    Rudene Re, MD 08/21/16 [phone]

## 2017-01-17 IMAGING — MR MR MRA HEAD W/O CM
1 series · 23 of 48 positions shown · non-contrast
Comparison: 01/27/2014

CLINICAL DATA: Acute headache. Left-sided head pain with sneezing
occurring intermittently for a few months. Associated confusion.
Currently 37 weeks pregnant.

EXAM:
MRA HEAD WITHOUT CONTRAST
TECHNIQUE: Angiographic images of the Circle of [place] were obtained using MRA
technique without intravenous contrast.

[Series 2: TOF · axial · non-contrast · 0.7mm · 0.37mm/px · z∈[-33,+73]mm · 23 of 160 slices shown]
[im 1/160]
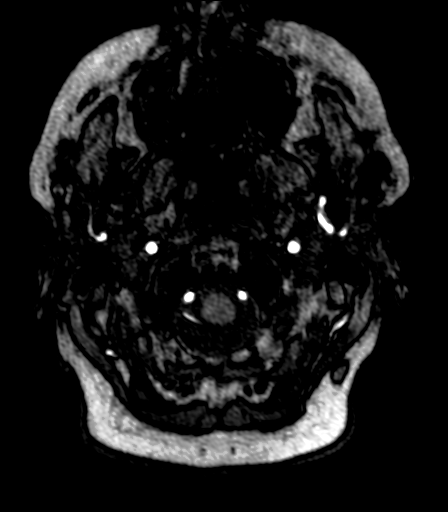
[im 4/160]
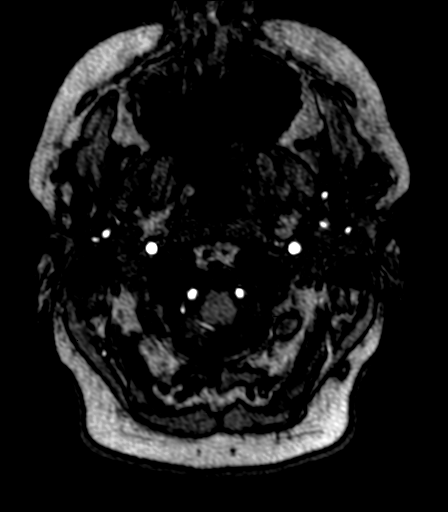
[im 7/160]
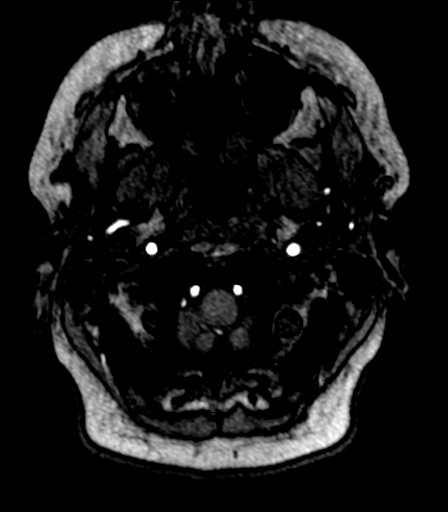
[im 11/160]
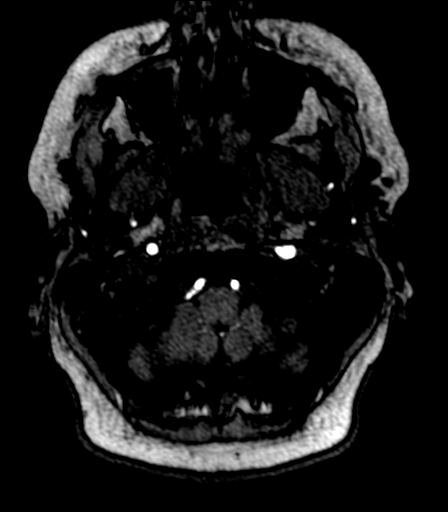
[im 14/160]
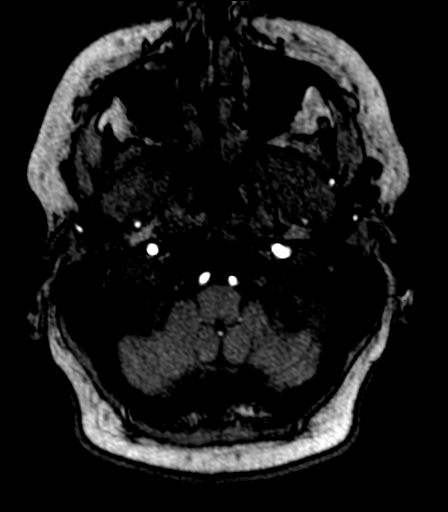
[im 17/160]
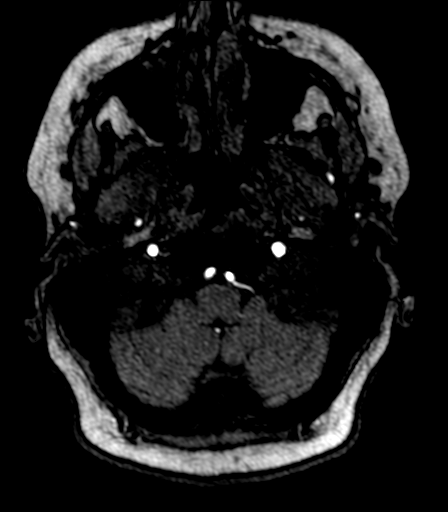
[im 21/160]
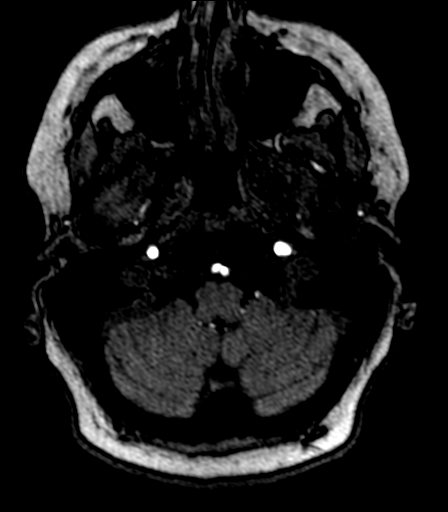
[im 24/160]
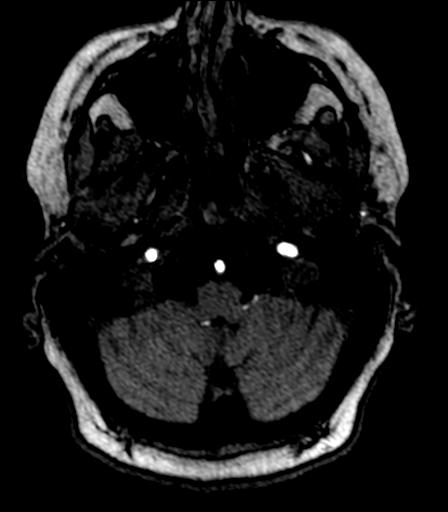
[im 28/160]
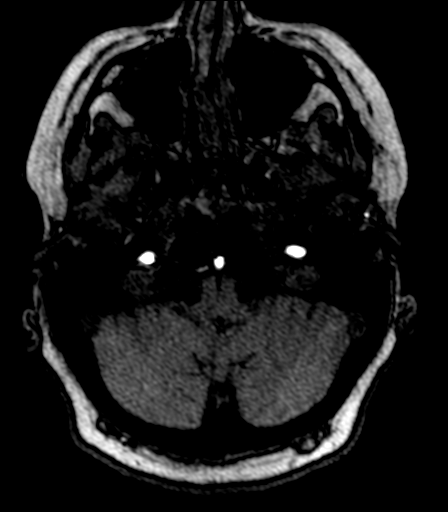
[im 31/160]
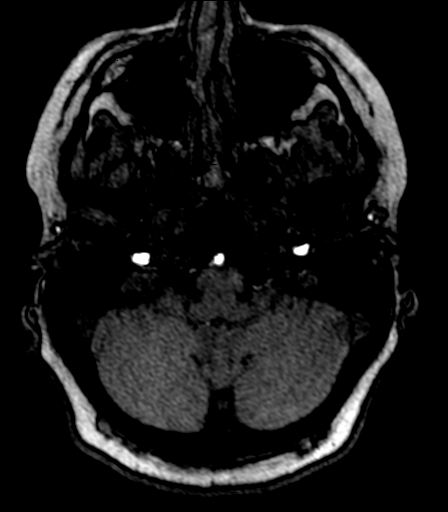
[im 34/160]
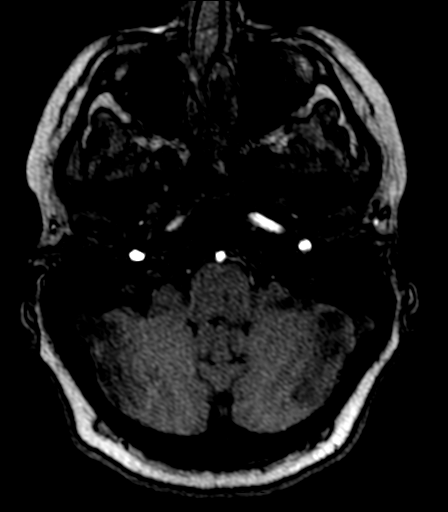
[im 38/160]
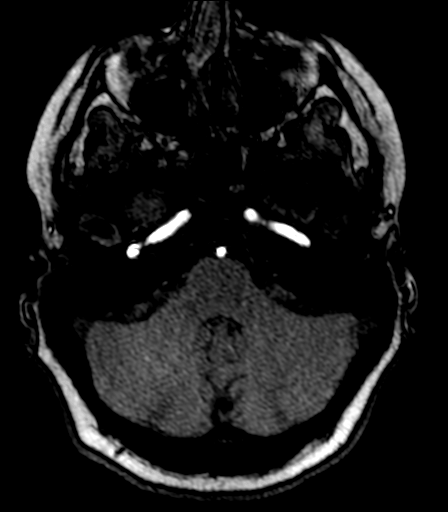
[im 41/160]
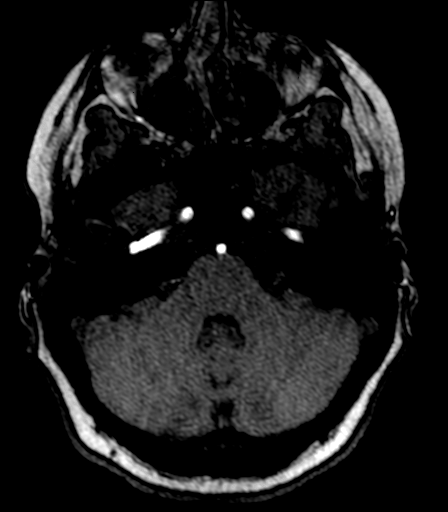
[im 44/160]
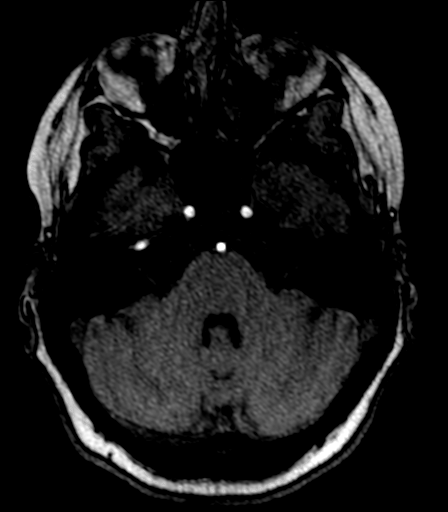
[im 48/160]
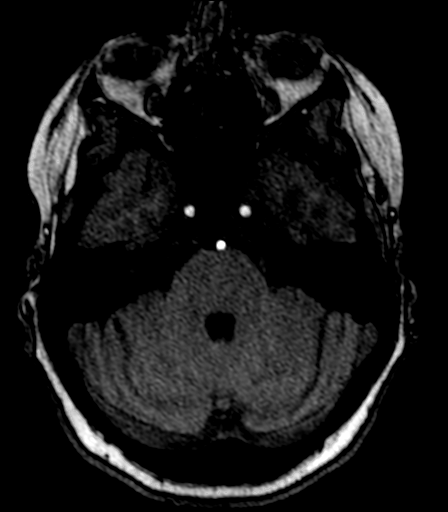
[im 51/160]
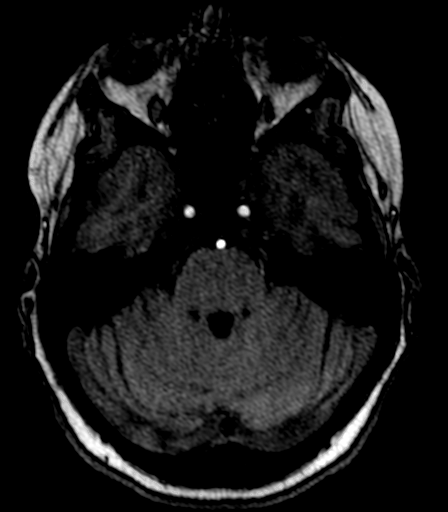
[im 72/160]
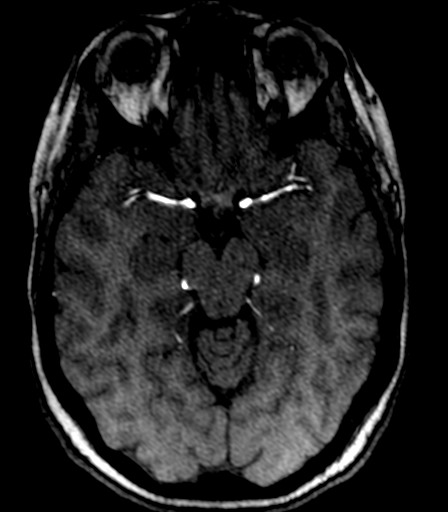
[im 82/160]
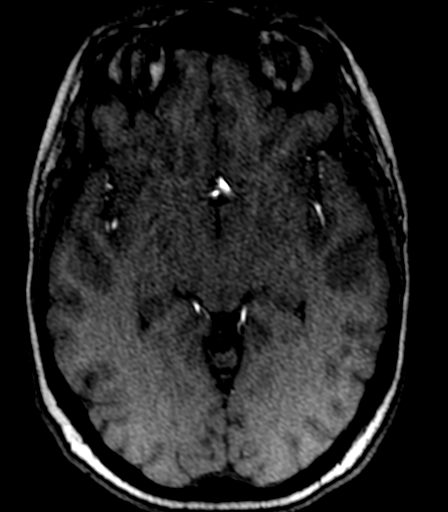
[im 92/160]
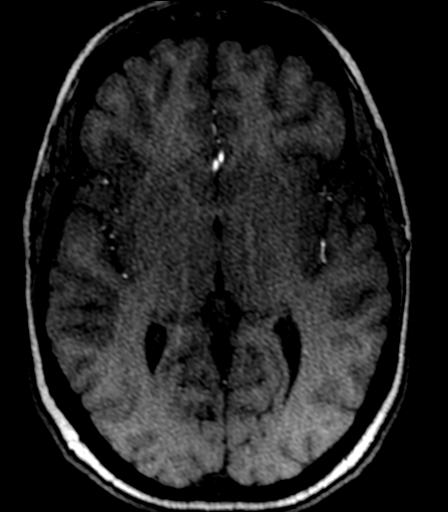
[im 112/160]
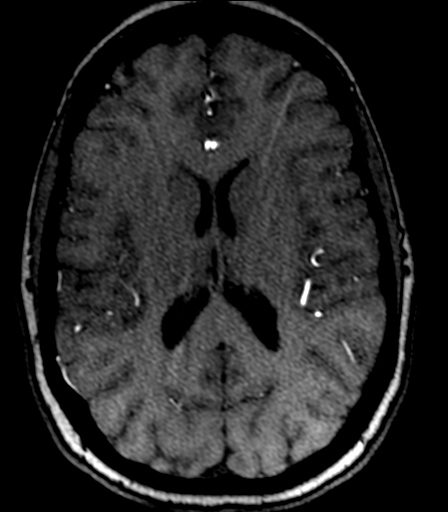
[im 132/160]
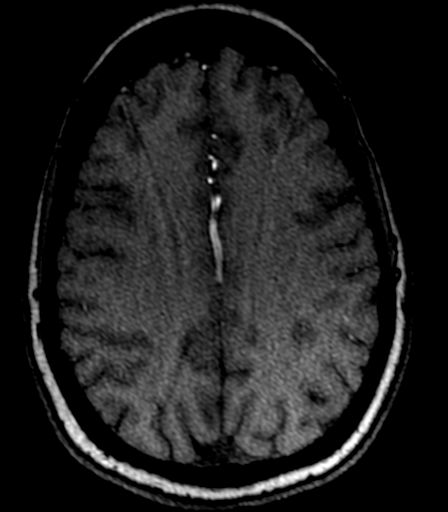
[im 136/160]
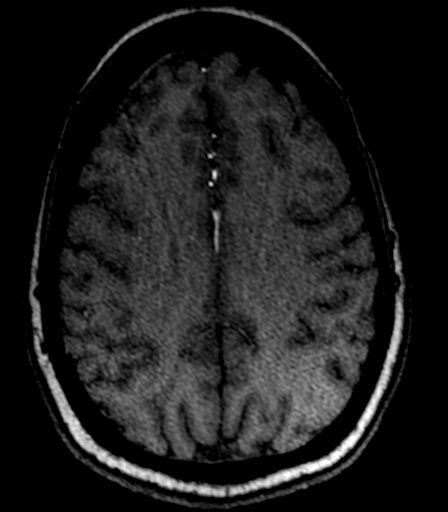
[im 153/160]
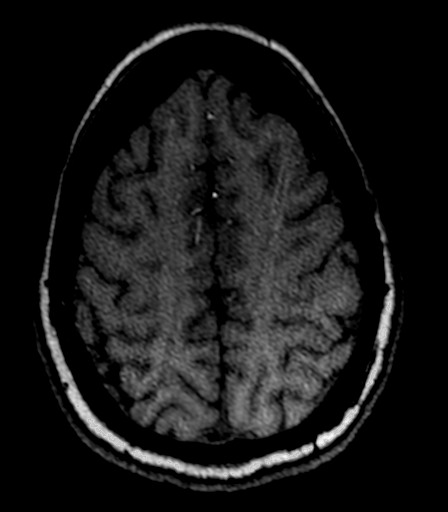

[23 of 48 positions shown; findings below may reference images not displayed]

FINDINGS: The visualized distal vertebral arteries are patent and codominant.
PICA, AICA, and SCA origins are patent. Basilar artery is widely
patent. There are small posterior communicating arteries, left
larger than right. PCAs are unremarkable.

Internal carotid arteries are patent from skullbase to carotid
termini without stenosis. ACAs and MCAs are patent without evidence
of significant stenosis or major branch vessel occlusion. Apparent
mild MCA branch vessel irregularity is favored to be artifactual. No
intracranial aneurysm is identified.
IMPRESSION: Negative head MRA.

## 2017-07-25 ENCOUNTER — Other Ambulatory Visit: Payer: Medicaid Other

## 2017-08-01 ENCOUNTER — Ambulatory Visit
Admission: RE | Admit: 2017-08-01 | Payer: Medicaid Other | Source: Ambulatory Visit | Admitting: Obstetrics and Gynecology

## 2017-08-01 ENCOUNTER — Encounter: Admission: RE | Payer: Self-pay | Source: Ambulatory Visit

## 2017-08-01 SURGERY — LIGATION, FALLOPIAN TUBE, LAPAROSCOPIC
Anesthesia: General | Laterality: Bilateral

## 2018-08-07 ENCOUNTER — Emergency Department
Admission: EM | Admit: 2018-08-07 | Discharge: 2018-08-08 | Disposition: A | Discharge status: Home / Self Care | Payer: Medicaid Other | Attending: Emergency Medicine | Admitting: Emergency Medicine

## 2018-08-07 ENCOUNTER — Other Ambulatory Visit: Payer: Self-pay

## 2018-08-07 DIAGNOSIS — Z87891 Personal history of nicotine dependence: Secondary | ICD-10-CM | POA: Insufficient documentation

## 2018-08-07 DIAGNOSIS — J45909 Unspecified asthma, uncomplicated: Secondary | ICD-10-CM | POA: Diagnosis not present

## 2018-08-07 DIAGNOSIS — Z79899 Other long term (current) drug therapy: Secondary | ICD-10-CM | POA: Insufficient documentation

## 2018-08-07 DIAGNOSIS — K625 Hemorrhage of anus and rectum: Secondary | ICD-10-CM | POA: Diagnosis present

## 2018-08-07 DIAGNOSIS — R197 Diarrhea, unspecified: Secondary | ICD-10-CM | POA: Insufficient documentation

## 2018-08-07 DIAGNOSIS — K649 Unspecified hemorrhoids: Secondary | ICD-10-CM | POA: Insufficient documentation

## 2018-08-07 DIAGNOSIS — R109 Unspecified abdominal pain: Secondary | ICD-10-CM | POA: Diagnosis not present

## 2018-08-07 LAB — COMPREHENSIVE METABOLIC PANEL
ALT: 26 U/L (ref 0–44)
ANION GAP: 6 (ref 5–15)
AST: 24 U/L (ref 15–41)
Albumin: 3.7 g/dL (ref 3.5–5.0)
Alkaline Phosphatase: 53 U/L (ref 38–126)
BILIRUBIN TOTAL: 0.2 mg/dL — AB (ref 0.3–1.2)
BUN: 14 mg/dL (ref 6–20)
CO2: 29 mmol/L (ref 22–32)
CREATININE: 1.05 mg/dL — AB (ref 0.44–1.00)
Calcium: 7.7 mg/dL — ABNORMAL LOW (ref 8.9–10.3)
Chloride: 105 mmol/L (ref 98–111)
GFR calc non Af Amer: >60 mL/min (ref >60)
GLUCOSE: 112 mg/dL — AB (ref 70–99)
Potassium: 3.8 mmol/L (ref 3.5–5.1)
SODIUM: 140 mmol/L (ref 135–145)
TOTAL PROTEIN: 6.7 g/dL (ref 6.5–8.1)

## 2018-08-07 LAB — CBC
HCT: 36.2 % (ref 35.0–47.0)
HEMOGLOBIN: 12.3 g/dL (ref 12.0–16.0)
MCH: 29.2 pg (ref 26.0–34.0)
MCHC: 34 g/dL (ref 32.0–36.0)
MCV: 86 fL (ref 80.0–100.0)
PLATELETS: 296 10*3/uL (ref 150–440)
RBC: 4.2 MIL/uL (ref 3.80–5.20)
RDW: 14.2 % (ref 11.5–14.5)
WBC: 9.7 10*3/uL (ref 3.6–11.0)

## 2018-08-07 LAB — POCT PREGNANCY, URINE: Preg Test, Ur: NEGATIVE

## 2018-08-07 MED ORDER — IOPAMIDOL (ISOVUE-300) INJECTION 61%
30.0000 mL | Freq: Once | INTRAVENOUS | Status: AC
  Administered 2018-08-07: 30 mL via ORAL

## 2018-08-07 NOTE — ED Triage Notes (Signed)
Pt arrives to ED via POV from home with c/o "diarrhea and constipation" x1 week. Pt reports s/x's started with constipation, but now having "5-6 BMs per day". Pt reports "dark red" rectal bleeding started at the same time. Pt reports rectal pain, but denies SHOB or CP; no fevers, no N/V.

## 2018-08-07 NOTE — ED Provider Notes (Signed)
[facility] Emergency Department Provider Note   First MD Initiated Contact with Patient 08/07/18 2316     (approximate)  I have reviewed the triage vital signs and the nursing notes.   HISTORY  Chief Complaint Rectal Bleeding    HPI Holly Wagner is a 27 y.o. female below list of chronic medical conditions presents to the emergency department with a history of 10 months of diarrhea which the patient states she had 10-12 bowel movements per day followed by abrupt cessation of diarrhea 1 week ago and "constipation" since.  Patient states that she is having 5-6 large bowel movements per day of formed stool.  Patient also admits to rectal pain and bright red blood per rectum   Past Medical History:  Diagnosis Date  . ADD (attention deficit disorder)   . Anxiety and depression   . Asthma   . Bipolar 1 disorder ([place])   . Mental disorder     Patient Active Problem List   Diagnosis Date Noted  . Irregular contractions 10/31/2015  . Indication for care in labor and delivery, antepartum 09/09/2015    Past Surgical History:  Procedure Laterality Date  . CHOLECYSTECTOMY  2007  . fatty tumor removal  1992  . SINUS SURGERY WITH INSTATRAK     unsure, maybe 4 years ago  . WISDOM TOOTH EXTRACTION      Prior to Admission medications   Medication Sig Start Date End Date Taking? Authorizing Provider  ibuprofen (ADVIL,MOTRIN) 600 MG tablet Take 1 tablet (600 mg total) by mouth every 8 (eight) hours as needed. 05/18/16   Sable Feil, PA-C  lurasidone (LATUDA) 40 MG TABS tablet Take 40 mg by mouth at bedtime.    [provider]  omeprazole (PRILOSEC) 40 MG capsule Take 40 mg by mouth daily.    [provider]  Prenatal Vit-Fe Fumarate-FA (PRENATAL MULTIVITAMIN) TABS tablet Take 1 tablet by mouth daily at 12 noon.    [provider]  traMADol (ULTRAM) 50 MG tablet Take 1 tablet (50 mg total) by mouth every 6 (six) hours as needed  for moderate pain. 05/18/16   Sable Feil, PA-C  traZODone (DESYREL) 100 MG tablet Take 100 mg by mouth at bedtime.    [provider]    Allergies Morphine and related  Family History  Problem Relation Age of Onset  . Cancer Other     Social History Social History   Tobacco Use  . Smoking status: Former Smoker    Packs/day: 0.50    Types: Cigarettes    Last attempt to quit: 04/30/2015    Years since quitting: 3.2  . Smokeless tobacco: Never Used  Substance Use Topics  . Alcohol use: No  . Drug use: No    Review of Systems Constitutional: No fever/chills Eyes: No visual changes. ENT: No sore throat. Cardiovascular: Denies chest pain. Respiratory: Denies shortness of breath. Gastrointestinal: For abdominal pain and "constipation".  For bright red blood per rectum Genitourinary: Negative for dysuria. Musculoskeletal: Negative for neck pain.  Negative for back pain. Integumentary: Negative for rash. Neurological: Negative for headaches, focal weakness or numbness.   ____________________________________________   PHYSICAL EXAM:  VITAL SIGNS: ED Triage Vitals  Enc Vitals Group     BP 08/07/18 2021 114/68     Pulse Rate 08/07/18 2021 89     Resp 08/07/18 2021 16     Temp 08/07/18 2021 98.7 F (37.1 C)     Temp Source 08/07/18  2021 Oral     SpO2 08/07/18 2021 98 %     Weight 08/07/18 2019 108.9 kg (240 lb)     Height 08/07/18 2019 1.676 m (5\' 6" )     Head Circumference --      Peak Flow --      Pain Score 08/07/18 2019 4     Pain Loc --      Pain Edu? --      Excl. in [place]? --     Constitutional: Alert and oriented. Well appearing and in no acute distress. Eyes: Conjunctivae are normal. PERRL. EOMI. Head: Atraumatic. Mouth/Throat: Mucous membranes are moist. Neck: No stridor.   Cardiovascular: Normal rate, regular rhythm. Good peripheral circulation. Grossly normal heart sounds. Respiratory: Normal respiratory effort.  No retractions. Lungs  CTAB. Gastrointestinal: Soft and nontender. No distention.  Palpable internal hemorrhoid Musculoskeletal: No lower extremity tenderness nor edema. No gross deformities of extremities. Neurologic:  Normal speech and language. No gross focal neurologic deficits are appreciated.  Skin:  Skin is warm, dry and intact. No rash noted.   ____________________________________________   LABS (all labs ordered are listed, but only abnormal results are displayed)  Labs Reviewed  COMPREHENSIVE METABOLIC PANEL - Abnormal; Notable for the following components:      Result Value   Glucose, Bld 112 (*)    Creatinine, Ser 1.05 (*)    Calcium 7.7 (*)    Total Bilirubin 0.2 (*)    All other components within normal limits  CBC  TSH  POCT PREGNANCY, URINE  POC OCCULT BLOOD, ED  POC URINE PREG, ED      RADIOLOGY I, [place] BROWN, personally viewed and evaluated these images (plain radiographs) as part of my medical decision making, as well as reviewing the written report by the radiologist.  ED MD interpretation: No acute intra-abdominal pelvic process per radiologist.  Official radiology report(s): Ct Abdomen Pelvis W Contrast  Result Date: 08/08/2018 CLINICAL DATA:  Diarrhea and constipation rectal bleeding EXAM: CT ABDOMEN AND PELVIS WITH CONTRAST TECHNIQUE: Multidetector CT imaging of the abdomen and pelvis was performed using the standard protocol following bolus administration of intravenous contrast. CONTRAST:  162mL ISOVUE-300 IOPAMIDOL (ISOVUE-300) INJECTION 61% COMPARISON:  09/22/2017 FINDINGS: Lower chest: Lung bases demonstrate no acute consolidation or effusion. The heart size is normal. Hepatobiliary: No focal liver abnormality is seen. Status post cholecystectomy. No biliary dilatation. Pancreas: Unremarkable. No pancreatic ductal dilatation or surrounding inflammatory changes. Spleen: Normal in size without focal abnormality. Adrenals/Urinary Tract: Bilateral extrarenal pelvis.  Adrenal glands within normal limits. No focal abnormality within the kidneys. Distended bladder Stomach/Bowel: Stomach is within normal limits. Appendix appears normal. No evidence of bowel wall thickening, distention, or inflammatory changes. Vascular/Lymphatic: No significant vascular findings are present. No enlarged abdominal or pelvic lymph nodes. Reproductive: 2.9 cm left adnexal cyst. Low lying intrauterine device within the lower uterine segment and cervix, the arms of the device are tilted to the patient's right. Other: Negative for free air or free fluid. Musculoskeletal: No acute or significant osseous findings. IMPRESSION: 1. No CT evidence for acute intra-abdominal or pelvic abnormality. 2. Malpositioned intrauterine device, positioned within the lower uterine segment and cervix Electronically Signed   By: Donavan Foil M.D.   On: 08/08/2018 00:41      Procedures   ____________________________________________   INITIAL IMPRESSION / ASSESSMENT AND PLAN / ED COURSE  As part of my medical decision making, I reviewed the following data within the [place]  27 year old female present with above-stated history and physical exam patient with noted internal hemorrhoid on clinical exam however also concern for possibility of a perirectal abscess given history.  As such patient had a CT scan of the abdomen pelvis was performed which revealed no acute intra-abdominal finding     ____________________________________________  FINAL CLINICAL IMPRESSION(S) / ED DIAGNOSES  Final diagnoses:  Hemorrhoids, unspecified hemorrhoid type     MEDICATIONS GIVEN DURING THIS VISIT:  Medications  iopamidol (ISOVUE-300) 61 % injection 30 mL (30 mLs Oral Contrast Given 08/07/18 2331)  iopamidol (ISOVUE-300) 61 % injection 100 mL (100 mLs Intravenous Contrast Given 08/08/18 0021)  hydrocortisone (ANUSOL-HC) suppository 25 mg (25 mg Rectal Given 08/08/18 0232)     ED Discharge  Orders    None       Note:  This document was prepared using Dragon voice recognition software and may include unintentional dictation errors.    Gregor Hams, MD 08/08/18 [phone]

## 2018-08-08 ENCOUNTER — Emergency Department: Payer: Medicaid Other

## 2018-08-08 LAB — TSH: TSH: 1.728 u[IU]/mL (ref 0.350–4.500)

## 2018-08-08 MED ORDER — HYDROCORTISONE ACETATE 25 MG RE SUPP
25.0000 mg | Freq: Once | RECTAL | Status: AC
  Administered 2018-08-08: 25 mg via RECTAL
  Filled 2018-08-08: qty 1

## 2018-08-08 MED ORDER — IOPAMIDOL (ISOVUE-300) INJECTION 61%
100.0000 mL | Freq: Once | INTRAVENOUS | Status: AC | PRN
  Administered 2018-08-08: 100 mL via INTRAVENOUS

## 2018-08-08 NOTE — ED Notes (Signed)
Reviewed discharge instructions, follow-up care, and prescriptions with patient. Patient verbalized understanding of all information reviewed. Patient stable, with no distress noted at this time.    

## 2018-08-08 NOTE — ED Notes (Signed)
Patient transported to CT 

## 2018-08-14 DIAGNOSIS — K529 Noninfective gastroenteritis and colitis, unspecified: Secondary | ICD-10-CM | POA: Insufficient documentation

## 2018-08-14 DIAGNOSIS — K219 Gastro-esophageal reflux disease without esophagitis: Secondary | ICD-10-CM | POA: Insufficient documentation

## 2018-08-14 DIAGNOSIS — K580 Irritable bowel syndrome with diarrhea: Secondary | ICD-10-CM | POA: Insufficient documentation

## 2018-11-16 DIAGNOSIS — F1290 Cannabis use, unspecified, uncomplicated: Secondary | ICD-10-CM | POA: Insufficient documentation

## 2019-10-26 NOTE — Telephone Encounter (Signed)
error 

## 2020-10-13 DIAGNOSIS — R259 Unspecified abnormal involuntary movements: Secondary | ICD-10-CM | POA: Insufficient documentation

## 2020-10-13 DIAGNOSIS — R251 Tremor, unspecified: Secondary | ICD-10-CM | POA: Insufficient documentation

## 2021-02-01 ENCOUNTER — Other Ambulatory Visit: Payer: Self-pay | Admitting: Obstetrics and Gynecology

## 2021-02-13 NOTE — H&P (Signed)
Ms. Holly Wagner is a 30 y.o. female here for [facility] and bilateral salpingectomy .  Marland Kitchenpt with a long history of irregular bleeding . Would bleed 2 weeks at the time ++ clotting . Cant takeOCPs Because this exacerbates her bipolar  She has been using a Paragard for 20 months and her bleeding has been irregular . Also has postcoital bleeding and intermittent dyspareunia . + dysmenorrhea  She Desires definitive treatment .  EMBX 11/21: neg   Neurology work up last visit , possible SLE   Past Medical History:  has a past medical history of Abnormal uterine bleeding, ALLERGIC RHINITIS, Allergic state, Anemia, Anxiety, ASTHMA, Bipolar disorder (CMS-HCC), Depression, Dyspareunia, GERD (gastroesophageal reflux disease), Hypertrophy of inferior nasal turbinate, IUD (intrauterine device) in place, Nasal obstruction without choanal atresia, PONV (postoperative nausea and vomiting), Syncope and collapse, TMJ (dislocation of temporomandibular joint), and Tremor of face and hands.  Past Surgical History:  has a past surgical history that includes Cholecystectomy; fatty tumor back; wisdom teeth extraction; septoplasty (N/A, 03/26/2013); resection nasal turbinate (Bilateral, 03/26/2013); esophagogastrodoudenoscopy w/biopsy (N/A, 04/17/2018); colonoscopy w/biopsy (N/A, 09/11/2018); colonoscopy w/biopsy (N/A, 10/14/2019); and colonoscopy w/removal lesions by snare (N/A, 10/14/2019). Family History: family history includes Alcohol abuse in her mother; Lupus in her cousin and maternal grandmother. Social History:  reports that she has been smoking cigarettes. She has been smoking about 1.00 pack per day. She has never used smokeless tobacco. She reports current alcohol use. She reports current drug use. Drug: Marijuana. OB/GYN History:          OB History    Gravida  2   Para  2   Term  2   Preterm      AB      Living  2     SAB      IAB      Ectopic      Molar      Multiple      Live Births  2           Allergies: is allergic to morphine. Medications:  Current Outpatient Medications:  .  albuterol (PROVENTIL HFA) 90 mcg/actuation inhaler, Inhale 1 inhalation into the lungs every 4 (four) hours as needed for Wheezing, Disp: 6.7 g, Rfl: 11 .  colestipoL (COLESTID) 1 gram tablet, Take 2 tablets (2 g total) by mouth 2 (two) times daily, Disp: 120 tablet, Rfl: 5 .  doxycycline (MONODOX) 100 MG capsule, Take 1 capsule (100 mg total) by mouth 2 (two) times daily for 10 days, Disp: 20 capsule, Rfl: 0 .  fluticasone propionate (FLONASE) 50 mcg/actuation nasal spray, Place 2 sprays into both nostrils once daily, Disp: 16 g, Rfl: 11 .  omeprazole (PRILOSEC) 40 MG DR capsule, Take 1 capsule (40 mg total) by mouth once daily Please call [phone] for follow up appointment., Disp: 90 capsule, Rfl: 3 .  pregabalin (LYRICA) 50 MG capsule, Take 1 capsule (50 mg total) by mouth nightly, Disp: 30 capsule, Rfl: 11 .  escitalopram oxalate (LEXAPRO) 10 MG tablet, Take 1 tablet (10 mg total) by mouth once daily (Patient not taking: Reported on 01/24/2021  ), Disp: 90 tablet, Rfl: 3 .  fluticasone propionate (FLONASE) 50 mcg/actuation nasal spray, Place 2 sprays into both nostrils once daily (Patient not taking: Reported on 01/18/2021  ), Disp: 16 g, Rfl: 11 .  folic acid (FOLVITE) 1 MG tablet, Take 1 tablet (1 mg total) by mouth once daily (Patient not taking: Reported on 01/24/2021  ), Disp: 30 tablet,  Rfl: 11 .  mirtazapine (REMERON) 15 MG tablet, Take 1 tablet (15 mg total) by mouth nightly (Patient not taking: Reported on 01/24/2021  ), Disp: 30 tablet, Rfl: 11  Review of Systems: General:                      No fatigue or weight loss Eyes:                           No vision changes Ears:                            No hearing difficulty Respiratory:                No cough or shortness of breath Pulmonary:                  No asthma or shortness of breath Cardiovascular:           No chest pain,  palpitations, dyspnea on exertion Gastrointestinal:          No abdominal bloating, chronic diarrhea, constipations, masses, pain or hematochezia Genitourinary:             No hematuria, dysuria, abnormal vaginal discharge, pelvic pain, Menometrorrhagia Lymphatic:                   No swollen lymph nodes Musculoskeletal:         No muscle weakness Neurologic:                  No extremity weakness, syncope, seizure disorder Psychiatric:                  No history of depression, delusions or suicidal/homicidal ideation    Exam:      Vitals:   02/15/21 1455  BP: 137/85  Pulse: 94    Body mass index is 40.86 kg/m.  WDWN white/  female in NAD   Lungs: CTA  CV : RRR without murmur    Neck:  no thyromegaly Abdomen: soft , no mass, normal active bowel sounds,  non-tender, no rebound tenderness Pelvic: tanner stage 5 ,  External genitalia: vulva /labia no lesions Urethra: no prolapse Vagina: normal physiologic d/c, adequate room for TVH  Cervix: no lesions, no cervical motion tenderness, IUD seen    Uterus: normal size shape and contour, non-tender Adnexa: no mass,  non-tender   Rectovaginal: no mass heme negative  Impression:   The primary encounter diagnosis was Menorrhagia with irregular cycle. Diagnoses of Dyspareunia, female and Dysmenorrhea were also pertinent to this visit.    Plan:   Pt is ready to proceed with a TVH for definitive surgery .  Benefits and risks to surgery: The proposed benefit of the surgery has been discussed with the patient. The possible risks include, but are not limited to: organ injury to the bowel , bladder, ureters, and major blood vessels and nerves. There is a possibility of additional surgeries resulting from these injuries. There is also the risk of blood transfusion and the need to receive blood products during or after the procedure which may rarely lead to HIV or Hepatitis C infection. There is a risk of developing a deep  venous thrombosis or a pulmonary embolism . There is the possibility of wound infection and also anesthetic complications, even the rare possibility of death. The patient understands these risks  and wishes to proceed. All questions have been answered      No follow-ups on file.  Caroline Sauger, MD

## 2021-02-16 DIAGNOSIS — K529 Noninfective gastroenteritis and colitis, unspecified: Secondary | ICD-10-CM | POA: Insufficient documentation

## 2021-02-16 DIAGNOSIS — R1115 Cyclical vomiting syndrome unrelated to migraine: Secondary | ICD-10-CM | POA: Insufficient documentation

## 2021-02-16 DIAGNOSIS — E872 Acidosis, unspecified: Secondary | ICD-10-CM | POA: Insufficient documentation

## 2021-02-19 ENCOUNTER — Other Ambulatory Visit: Payer: Medicaid Other

## 2021-02-22 ENCOUNTER — Other Ambulatory Visit: Payer: Medicaid Other

## 2021-03-30 ENCOUNTER — Other Ambulatory Visit: Payer: Medicaid Other

## 2021-04-03 NOTE — H&P (Signed)
Holly Wagner is a 30 y.o. female here forTVH and bilateral salpingectomy.  .pt with a long history of irregular bleeding . Would bleed 2 weeks at the time ++ clotting . Cant takeOCPs Because this exacerbates her bipolar  She has been using a Paragard for 20 months and her bleeding has been irregular . Also has postcoital bleeding and intermittent dyspareunia . + dysmenorrhea  She Desires definitive treatment .  EMBX 11/21: neg   Neurology work up last visit , possible SLE   Past Medical History:  has a past medical history of Abnormal uterine bleeding, ALLERGIC RHINITIS, Allergic state, Anemia, Anxiety, ASTHMA, Bipolar disorder (CMS-HCC), Depression, Dyspareunia, GERD (gastroesophageal reflux disease), Hypertrophy of inferior nasal turbinate, IUD (intrauterine device) in place, Nasal obstruction without choanal atresia, PONV (postoperative nausea and vomiting), Syncope and collapse, TMJ (dislocation of temporomandibular joint), and Tremor of face and hands.  Past Surgical History:  has a past surgical history that includes Cholecystectomy; fatty tumor back; wisdom teeth extraction; septoplasty (N/A, 03/26/2013); resection nasal turbinate (Bilateral, 03/26/2013); esophagogastrodoudenoscopy w/biopsy (N/A, 04/17/2018); colonoscopy w/biopsy (N/A, 09/11/2018); colonoscopy w/biopsy (N/A, 10/14/2019); and colonoscopy w/removal lesions by snare (N/A, 10/14/2019). Family History: family history includes Alcohol abuse in her mother; Lupus in her cousin and maternal grandmother. Social History:  reports that she has been smoking cigarettes. She has been smoking about 1.00 pack per day. She has never used smokeless tobacco. She reports current alcohol use. She reports current drug use. Drug: Marijuana. OB/GYN History:          OB History    Gravida  2   Para  2   Term  2   Preterm      AB      Living  2     SAB      IAB      Ectopic      Molar      Multiple      Live Births  2           Allergies: is allergic to morphine. Medications:  Current Outpatient Medications:  .  albuterol (PROVENTIL HFA) 90 mcg/actuation inhaler, Inhale 1 inhalation into the lungs every 4 (four) hours as needed for Wheezing, Disp: 6.7 g, Rfl: 11 .  colestipoL (COLESTID) 1 gram tablet, Take 2 tablets (2 g total) by mouth 2 (two) times daily, Disp: 120 tablet, Rfl: 5 .  doxycycline (MONODOX) 100 MG capsule, Take 1 capsule (100 mg total) by mouth 2 (two) times daily for 10 days, Disp: 20 capsule, Rfl: 0 .  fluticasone propionate (FLONASE) 50 mcg/actuation nasal spray, Place 2 sprays into both nostrils once daily, Disp: 16 g, Rfl: 11 .  omeprazole (PRILOSEC) 40 MG DR capsule, Take 1 capsule (40 mg total) by mouth once daily Please call [phone] for follow up appointment., Disp: 90 capsule, Rfl: 3 .  pregabalin (LYRICA) 50 MG capsule, Take 1 capsule (50 mg total) by mouth nightly, Disp: 30 capsule, Rfl: 11 .  escitalopram oxalate (LEXAPRO) 10 MG tablet, Take 1 tablet (10 mg total) by mouth once daily (Patient not taking: Reported on 01/24/2021  ), Disp: 90 tablet, Rfl: 3 .  fluticasone propionate (FLONASE) 50 mcg/actuation nasal spray, Place 2 sprays into both nostrils once daily (Patient not taking: Reported on 01/18/2021  ), Disp: 16 g, Rfl: 11 .  folic acid (FOLVITE) 1 MG tablet, Take 1 tablet (1 mg total) by mouth once daily (Patient not taking: Reported on 01/24/2021  ), Disp: 30 tablet, Rfl: 11 .  mirtazapine (REMERON) 15 MG tablet, Take 1 tablet (15 mg total) by mouth nightly (Patient not taking: Reported on 01/24/2021  ), Disp: 30 tablet, Rfl: 11  Review of Systems: General:                      No fatigue or weight loss Eyes:                           No vision changes Ears:                            No hearing difficulty Respiratory:                No cough or shortness of breath Pulmonary:                  No asthma or shortness of breath Cardiovascular:           No chest pain,  palpitations, dyspnea on exertion Gastrointestinal:          No abdominal bloating, chronic diarrhea, constipations, masses, pain or hematochezia Genitourinary:             No hematuria, dysuria, abnormal vaginal discharge, pelvic pain, Menometrorrhagia Lymphatic:                   No swollen lymph nodes Musculoskeletal:         No muscle weakness Neurologic:                  No extremity weakness, syncope, seizure disorder Psychiatric:                  No history of depression, delusions or suicidal/homicidal ideation    Exam:      Vitals:   04/03/21 1455  BP: 137/85  Pulse: 94    Body mass index is 40.86 kg/m.  WDWN white/  female in NAD   Lungs: CTA  CV : RRR without murmur    Neck:  no thyromegaly Abdomen: soft , no mass, normal active bowel sounds,  non-tender, no rebound tenderness Pelvic: tanner stage 5 ,  External genitalia: vulva /labia no lesions Urethra: no prolapse Vagina: normal physiologic d/c, adequate room for TVH  Cervix: no lesions, no cervical motion tenderness, IUD seen    Uterus: normal size shape and contour, non-tender Adnexa: no mass,  non-tender   Rectovaginal: no mass heme negative  Impression:   The primary encounter diagnosis was Menorrhagia with irregular cycle. Diagnoses of Dyspareunia, female and Dysmenorrhea were also pertinent to this visit.    Plan:   Pt is ready to proceed with a TVH for definitive surgery .  Benefits and risks to surgery: The proposed benefit of the surgery has been discussed with the patient. The possible risks include, but are not limited to: organ injury to the bowel , bladder, ureters, and major blood vessels and nerves. There is a possibility of additional surgeries resulting from these injuries. There is also the risk of blood transfusion and the need to receive blood products during or after the procedure which may rarely lead to HIV or Hepatitis C infection. There is a risk of developing a deep  venous thrombosis or a pulmonary embolism . There is the possibility of wound infection and also anesthetic complications, even the rare possibility of death. The patient understands these risks and wishes to proceed.  All questions have been answered        [facility] Quashawn Jewkes

## 2021-04-04 ENCOUNTER — Other Ambulatory Visit: Payer: Medicaid Other

## 2021-04-06 ENCOUNTER — Encounter
Admission: RE | Admit: 2021-04-06 | Discharge: 2021-04-06 | Disposition: A | Discharge status: Home / Self Care | Payer: Medicaid Other | Source: Ambulatory Visit | Attending: Obstetrics and Gynecology | Admitting: Obstetrics and Gynecology

## 2021-04-06 ENCOUNTER — Other Ambulatory Visit: Payer: Self-pay

## 2021-04-06 HISTORY — DX: Gastro-esophageal reflux disease without esophagitis: K21.9

## 2021-04-06 HISTORY — DX: Nausea with vomiting, unspecified: R11.2

## 2021-04-06 HISTORY — DX: Other specified postprocedural states: Z98.890

## 2021-04-06 HISTORY — DX: Postgastric surgery syndromes: K91.1

## 2021-04-06 HISTORY — DX: Prediabetes: R73.03

## 2021-04-06 NOTE — Patient Instructions (Signed)
Your procedure is scheduled on: 04/13/21 Report to [place]. You will need to stop at the Admitting Desk on the 1st floor. To find out your arrival time please call [phone] between 1PM - 3PM on 04/12/21.  Remember: Instructions that are not followed completely may result in serious medical risk, up to and including death, or upon the discretion of your surgeon and anesthesiologist your surgery may need to be rescheduled.     _X__ 1. Do not eat food or drink any liquids after midnight the night before your procedure.                 No gum chewing or hard candies.   __X__2.  On the morning of surgery brush your teeth with toothpaste and water, you                 may rinse your mouth with mouthwash if you wish.  Do not swallow any              toothpaste of mouthwash.     _X__ 3.  No Alcohol for 24 hours before or after surgery.   _X__ 4.  Do Not Smoke or use e-cigarettes For 24 Hours Prior to Your Surgery.                 Do not use any chewable tobacco products for at least 6 hours prior to                 surgery.  ____  5.  Bring all medications with you on the day of surgery if instructed.   __X__  6.  Notify your doctor if there is any change in your medical condition      (cold, fever, infections).     Do not wear jewelry, make-up, hairpins, clips or nail polish. Do not wear lotions, powders, or perfumes.  Do not shave 48 hours prior to surgery. Men may shave face and neck. Do not bring valuables to the hospital.    [facility] is not responsible for any belongings or valuables.  Contacts, dentures/partials or body piercings may not be worn into surgery. Bring a case for your contacts, glasses or hearing aids, a denture cup will be supplied. Leave your suitcase in the car. After surgery it may be brought to your room. For patients admitted to the hospital, discharge time is determined by your treatment  team.   Patients discharged the day of surgery will not be allowed to drive home.   Please read over the following fact sheets that you were given:    chg soap, Incentive Spirometer  __X__ Take these medicines the morning of surgery with A SIP OF WATER:    1. omeprazole (PRILOSEC) 40 MG capsule  2.   3.   4.  5.  6.  ____ Fleet Enema (as directed)   __X__ Use CHG Soap/SAGE wipes as directed  __X__ Use inhalers on the day of surgery  YOU MAY ALSO USE YOUR FLONASE IN NEEDED  ____ Stop metformin/Janumet/Farxiga 2 days prior to surgery    ____ Take 1/2 of usual insulin dose the night before surgery. No insulin the morning          of surgery.   ____ Stop Blood Thinners Coumadin/Plavix/Xarelto/Pleta/Pradaxa/Eliquis/Effient/Aspirin  on   Or contact your Surgeon, Cardiologist or Medical Doctor regarding  ability to stop your blood thinners  __X__ Stop Anti-inflammatories 7 days  before surgery such as Advil, Ibuprofen, Motrin,  BC or Goodies Powder, Naprosyn, Naproxen, Aleve, Aspirin   Stop the Bayer. You may use Tylenol or Acetaminophen if needed for aches or pain  __X__ Stop all herbal supplements, fish oil or vitamin E until after surgery. Do not start anything new   ____ Bring C-Pap to the hospital.

## 2021-04-11 ENCOUNTER — Other Ambulatory Visit
Admission: RE | Admit: 2021-04-11 | Discharge: 2021-04-11 | Disposition: A | Discharge status: Home / Self Care | Payer: Medicaid Other | Source: Ambulatory Visit | Attending: Obstetrics and Gynecology | Admitting: Obstetrics and Gynecology

## 2021-04-11 ENCOUNTER — Other Ambulatory Visit: Payer: Self-pay

## 2021-04-11 DIAGNOSIS — Z20822 Contact with and (suspected) exposure to covid-19: Secondary | ICD-10-CM | POA: Insufficient documentation

## 2021-04-11 DIAGNOSIS — Z01812 Encounter for preprocedural laboratory examination: Secondary | ICD-10-CM | POA: Insufficient documentation

## 2021-04-11 LAB — CBC
HCT: 34.9 % — ABNORMAL LOW (ref 36.0–46.0)
Hemoglobin: 11.1 g/dL — ABNORMAL LOW (ref 12.0–15.0)
MCH: 25.6 pg — ABNORMAL LOW (ref 26.0–34.0)
MCHC: 31.8 g/dL (ref 30.0–36.0)
MCV: 80.4 fL (ref 80.0–100.0)
Platelets: 325 10*3/uL (ref 150–400)
RBC: 4.34 MIL/uL (ref 3.87–5.11)
RDW: 15.6 % — ABNORMAL HIGH (ref 11.5–15.5)
WBC: 8.3 10*3/uL (ref 4.0–10.5)
nRBC: 0 % (ref 0.0–0.2)

## 2021-04-11 LAB — SARS CORONAVIRUS 2 (TAT 6-24 HRS): SARS Coronavirus 2: NEGATIVE

## 2021-04-11 LAB — BASIC METABOLIC PANEL
Anion gap: 9 (ref 5–15)
BUN: 15 mg/dL (ref 6–20)
CO2: 25 mmol/L (ref 22–32)
Calcium: 8.4 mg/dL — ABNORMAL LOW (ref 8.9–10.3)
Chloride: 102 mmol/L (ref 98–111)
Creatinine, Ser: 0.88 mg/dL (ref 0.44–1.00)
GFR, Estimated: >60 mL/min (ref >60)
Glucose, Bld: 158 mg/dL — ABNORMAL HIGH (ref 70–99)
Potassium: 3.7 mmol/L (ref 3.5–5.1)
Sodium: 136 mmol/L (ref 135–145)

## 2021-04-11 LAB — TYPE AND SCREEN
ABO/RH(D): A POS
Antibody Screen: NEGATIVE

## 2021-04-13 ENCOUNTER — Encounter
Admission: RE | Disposition: A | Discharge status: Home / Self Care | Payer: Self-pay | Source: Home / Self Care | Attending: Obstetrics and Gynecology

## 2021-04-13 ENCOUNTER — Ambulatory Visit: Payer: Medicaid Other | Admitting: Anesthesiology

## 2021-04-13 ENCOUNTER — Ambulatory Visit
Admission: RE | Admit: 2021-04-13 | Discharge: 2021-04-13 | Disposition: A | Discharge status: Home / Self Care | Payer: Medicaid Other | Attending: Obstetrics and Gynecology | Admitting: Obstetrics and Gynecology

## 2021-04-13 ENCOUNTER — Encounter: Payer: Self-pay | Admitting: Obstetrics and Gynecology

## 2021-04-13 DIAGNOSIS — N920 Excessive and frequent menstruation with regular cycle: Secondary | ICD-10-CM | POA: Diagnosis present

## 2021-04-13 DIAGNOSIS — N83202 Unspecified ovarian cyst, left side: Secondary | ICD-10-CM | POA: Insufficient documentation

## 2021-04-13 DIAGNOSIS — Z79899 Other long term (current) drug therapy: Secondary | ICD-10-CM | POA: Diagnosis not present

## 2021-04-13 DIAGNOSIS — D251 Intramural leiomyoma of uterus: Secondary | ICD-10-CM | POA: Diagnosis not present

## 2021-04-13 DIAGNOSIS — Z30432 Encounter for removal of intrauterine contraceptive device: Secondary | ICD-10-CM | POA: Insufficient documentation

## 2021-04-13 DIAGNOSIS — F17210 Nicotine dependence, cigarettes, uncomplicated: Secondary | ICD-10-CM | POA: Diagnosis not present

## 2021-04-13 DIAGNOSIS — Z885 Allergy status to narcotic agent status: Secondary | ICD-10-CM | POA: Insufficient documentation

## 2021-04-13 DIAGNOSIS — N941 Unspecified dyspareunia: Secondary | ICD-10-CM | POA: Insufficient documentation

## 2021-04-13 DIAGNOSIS — N946 Dysmenorrhea, unspecified: Secondary | ICD-10-CM | POA: Insufficient documentation

## 2021-04-13 HISTORY — PX: BILATERAL SALPINGECTOMY: SHX5743

## 2021-04-13 HISTORY — PX: VAGINAL HYSTERECTOMY: SHX2639

## 2021-04-13 HISTORY — PX: IUD REMOVAL: SHX5392

## 2021-04-13 LAB — URINE DRUG SCREEN, QUALITATIVE (ARMC ONLY)
Amphetamines, Ur Screen: NOT DETECTED
Barbiturates, Ur Screen: NOT DETECTED
Benzodiazepine, Ur Scrn: POSITIVE — AB
Cannabinoid 50 Ng, Ur ~~LOC~~: NOT DETECTED
Cocaine Metabolite,Ur ~~LOC~~: NOT DETECTED
MDMA (Ecstasy)Ur Screen: NOT DETECTED
Methadone Scn, Ur: NOT DETECTED
Opiate, Ur Screen: NOT DETECTED
Phencyclidine (PCP) Ur S: NOT DETECTED
Tricyclic, Ur Screen: NOT DETECTED

## 2021-04-13 LAB — ABO/RH: ABO/RH(D): A POS

## 2021-04-13 LAB — POCT PREGNANCY, URINE: Preg Test, Ur: NEGATIVE

## 2021-04-13 SURGERY — HYSTERECTOMY, VAGINAL
Anesthesia: General

## 2021-04-13 MED ORDER — GABAPENTIN 300 MG PO CAPS
ORAL_CAPSULE | ORAL | Status: AC
  Administered 2021-04-13: 300 mg via ORAL
  Filled 2021-04-13: qty 1

## 2021-04-13 MED ORDER — ONDANSETRON HCL 4 MG/2ML IJ SOLN
INTRAMUSCULAR | Status: AC
  Filled 2021-04-13: qty 2

## 2021-04-13 MED ORDER — PROPOFOL 10 MG/ML IV BOLUS
INTRAVENOUS | Status: DC | PRN
  Administered 2021-04-13: 200 mg via INTRAVENOUS

## 2021-04-13 MED ORDER — CHLORHEXIDINE GLUCONATE 0.12 % MT SOLN: 15.0000 mL | Freq: Once | OROMUCOSAL | Status: AC

## 2021-04-13 MED ORDER — ORAL CARE MOUTH RINSE: 15.0000 mL | Freq: Once | OROMUCOSAL | Status: AC

## 2021-04-13 MED ORDER — DEXAMETHASONE SODIUM PHOSPHATE 10 MG/ML IJ SOLN
INTRAMUSCULAR | Status: DC | PRN
  Administered 2021-04-13: 10 mg via INTRAVENOUS

## 2021-04-13 MED ORDER — MIDAZOLAM HCL 2 MG/2ML IJ SOLN
INTRAMUSCULAR | Status: DC | PRN
  Administered 2021-04-13: 2 mg via INTRAVENOUS

## 2021-04-13 MED ORDER — SUGAMMADEX SODIUM 200 MG/2ML IV SOLN
INTRAVENOUS | Status: DC | PRN
  Administered 2021-04-13: 200 mg via INTRAVENOUS

## 2021-04-13 MED ORDER — PROPOFOL 10 MG/ML IV BOLUS
INTRAVENOUS | Status: AC
  Filled 2021-04-13: qty 20

## 2021-04-13 MED ORDER — MIDAZOLAM HCL 2 MG/2ML IJ SOLN
INTRAMUSCULAR | Status: AC
  Filled 2021-04-13: qty 2

## 2021-04-13 MED ORDER — CEFAZOLIN SODIUM-DEXTROSE 2-4 GM/100ML-% IV SOLN
2.0000 g | Freq: Once | INTRAVENOUS | Status: AC
  Administered 2021-04-13: 2 g via INTRAVENOUS

## 2021-04-13 MED ORDER — ACETAMINOPHEN 500 MG PO TABS
ORAL_TABLET | ORAL | Status: AC
  Administered 2021-04-13: 1000 mg via ORAL
  Filled 2021-04-13: qty 2

## 2021-04-13 MED ORDER — SODIUM CHLORIDE FLUSH 0.9 % IV SOLN
INTRAVENOUS | Status: AC
  Filled 2021-04-13: qty 10

## 2021-04-13 MED ORDER — SCOPOLAMINE 1 MG/3DAYS TD PT72
1.0000 | MEDICATED_PATCH | TRANSDERMAL | Status: DC
  Administered 2021-04-13: 1.5 mg via TRANSDERMAL

## 2021-04-13 MED ORDER — LIDOCAINE HCL (CARDIAC) PF 100 MG/5ML IV SOSY
PREFILLED_SYRINGE | INTRAVENOUS | Status: DC | PRN
  Administered 2021-04-13: 100 mg via INTRAVENOUS

## 2021-04-13 MED ORDER — DEXMEDETOMIDINE (PRECEDEX) IN NS 20 MCG/5ML (4 MCG/ML) IV SYRINGE
PREFILLED_SYRINGE | INTRAVENOUS | Status: DC | PRN
  Administered 2021-04-13 (×2): 4 ug via INTRAVENOUS

## 2021-04-13 MED ORDER — FENTANYL CITRATE (PF) 100 MCG/2ML IJ SOLN
INTRAMUSCULAR | Status: AC
  Filled 2021-04-13: qty 2

## 2021-04-13 MED ORDER — GABAPENTIN 300 MG PO CAPS
300.0000 mg | ORAL_CAPSULE | ORAL | Status: AC
Start: 2021-04-13 — End: 2021-04-13

## 2021-04-13 MED ORDER — SODIUM CHLORIDE 0.9 % IV SOLN
6.2500 mg | Freq: Four times a day (QID) | INTRAVENOUS | Status: DC | PRN
  Filled 2021-04-13: qty 0.25

## 2021-04-13 MED ORDER — PROMETHAZINE HCL 25 MG/ML IJ SOLN
INTRAMUSCULAR | Status: AC
  Administered 2021-04-13: 6.25 mg
  Filled 2021-04-13: qty 1

## 2021-04-13 MED ORDER — FENTANYL CITRATE (PF) 100 MCG/2ML IJ SOLN
INTRAMUSCULAR | Status: AC
  Administered 2021-04-13: 25 ug via INTRAVENOUS
  Filled 2021-04-13: qty 2

## 2021-04-13 MED ORDER — ONDANSETRON HCL 4 MG PO TABS: 8.0000 mg | ORAL_TABLET | Freq: Once | ORAL | Status: DC

## 2021-04-13 MED ORDER — HYDROMORPHONE HCL 1 MG/ML IJ SOLN
INTRAMUSCULAR | Status: AC
  Administered 2021-04-13: 0.25 mg via INTRAVENOUS
  Filled 2021-04-13: qty 1

## 2021-04-13 MED ORDER — ONDANSETRON HCL 4 MG/2ML IJ SOLN
4.0000 mg | Freq: Once | INTRAMUSCULAR | Status: AC | PRN
  Administered 2021-04-13: 4 mg via INTRAVENOUS

## 2021-04-13 MED ORDER — ROCURONIUM BROMIDE 100 MG/10ML IV SOLN
INTRAVENOUS | Status: DC | PRN
  Administered 2021-04-13: 15 mg via INTRAVENOUS
  Administered 2021-04-13: 60 mg via INTRAVENOUS

## 2021-04-13 MED ORDER — CHLORHEXIDINE GLUCONATE 0.12 % MT SOLN
OROMUCOSAL | Status: AC
  Administered 2021-04-13: 15 mL via OROMUCOSAL
  Filled 2021-04-13: qty 15

## 2021-04-13 MED ORDER — HYDROCODONE-ACETAMINOPHEN 5-325 MG PO TABS: 1.0000 | ORAL_TABLET | ORAL | Status: DC | PRN

## 2021-04-13 MED ORDER — CEFAZOLIN SODIUM-DEXTROSE 2-4 GM/100ML-% IV SOLN
INTRAVENOUS | Status: AC
  Filled 2021-04-13: qty 100

## 2021-04-13 MED ORDER — KETOROLAC TROMETHAMINE 30 MG/ML IJ SOLN
INTRAMUSCULAR | Status: DC | PRN
  Administered 2021-04-13: 30 mg via INTRAVENOUS

## 2021-04-13 MED ORDER — HYDROMORPHONE HCL 1 MG/ML IJ SOLN
0.2500 mg | INTRAMUSCULAR | Status: DC | PRN
Start: 2021-04-13 — End: 2021-04-13
  Administered 2021-04-13 (×3): 0.25 mg via INTRAVENOUS

## 2021-04-13 MED ORDER — SCOPOLAMINE 1 MG/3DAYS TD PT72
MEDICATED_PATCH | TRANSDERMAL | Status: AC
  Filled 2021-04-13: qty 1

## 2021-04-13 MED ORDER — FENTANYL CITRATE (PF) 100 MCG/2ML IJ SOLN
INTRAMUSCULAR | Status: DC | PRN
  Administered 2021-04-13 (×2): 50 ug via INTRAVENOUS

## 2021-04-13 MED ORDER — POVIDONE-IODINE 10 % EX SWAB: 2.0000 "application " | Freq: Once | CUTANEOUS | Status: DC

## 2021-04-13 MED ORDER — LACTATED RINGERS IV SOLN: INTRAVENOUS | Status: DC

## 2021-04-13 MED ORDER — LIDOCAINE-EPINEPHRINE 1 %-1:100000 IJ SOLN
INTRAMUSCULAR | Status: DC | PRN
  Administered 2021-04-13: 9 mL

## 2021-04-13 MED ORDER — ONDANSETRON HCL 4 MG/2ML IJ SOLN
INTRAMUSCULAR | Status: DC | PRN
  Administered 2021-04-13: 4 mg via INTRAVENOUS

## 2021-04-13 MED ORDER — ACETAMINOPHEN 500 MG PO TABS: 1000.0000 mg | ORAL_TABLET | ORAL | Status: AC

## 2021-04-13 MED ORDER — FENTANYL CITRATE (PF) 100 MCG/2ML IJ SOLN
25.0000 ug | INTRAMUSCULAR | Status: AC | PRN
  Administered 2021-04-13 (×6): 25 ug via INTRAVENOUS

## 2021-04-13 SURGICAL SUPPLY — 41 items
BAG DRN RND TRDRP ANRFLXCHMBR (UROLOGICAL SUPPLIES) ×2
BAG URINE DRAIN 2000ML AR STRL (UROLOGICAL SUPPLIES) ×3 IMPLANT
CATH FOLEY 2WAY  5CC 16FR (CATHETERS) ×1
CATH FOLEY 2WAY 5CC 16FR (CATHETERS) ×2
CATH ROBINSON RED A/P 16FR (CATHETERS) ×3 IMPLANT
CATH URTH 16FR FL 2W BLN LF (CATHETERS) ×2 IMPLANT
COVER WAND RF STERILE (DRAPES) ×3 IMPLANT
DRAPE PERI LITHO V/GYN (MISCELLANEOUS) ×3 IMPLANT
DRAPE SURG 17X11 SM STRL (DRAPES) ×3 IMPLANT
DRAPE UNDER BUTTOCK W/FLU (DRAPES) ×3 IMPLANT
ELECT REM PT RETURN 9FT ADLT (ELECTROSURGICAL) ×3
ELECTRODE REM PT RTRN 9FT ADLT (ELECTROSURGICAL) ×2 IMPLANT
GAUZE 4X4 16PLY RFD (DISPOSABLE) ×3 IMPLANT
GLOVE SURG SYN 8.0 (GLOVE) ×27 IMPLANT
GLOVE SURG SYN 8.0 PF PI (GLOVE) ×2 IMPLANT
GOWN STRL REUS W/ TWL LRG LVL3 (GOWN DISPOSABLE) ×6 IMPLANT
GOWN STRL REUS W/ TWL XL LVL3 (GOWN DISPOSABLE) ×2 IMPLANT
GOWN STRL REUS W/TWL LRG LVL3 (GOWN DISPOSABLE) ×9
GOWN STRL REUS W/TWL XL LVL3 (GOWN DISPOSABLE) ×6
KIT PINK PAD W/HEAD ARE REST (MISCELLANEOUS) ×3
KIT PINK PAD W/HEAD ARM REST (MISCELLANEOUS) ×2 IMPLANT
KIT TURNOVER CYSTO (KITS) ×3 IMPLANT
LABEL OR SOLS (LABEL) ×3 IMPLANT
MANIFOLD NEPTUNE II (INSTRUMENTS) ×3 IMPLANT
NEEDLE HYPO 22GX1.5 SAFETY (NEEDLE) ×3 IMPLANT
PACK BASIN MINOR ARMC (MISCELLANEOUS) ×3 IMPLANT
PAD OB MATERNITY 4.3X12.25 (PERSONAL CARE ITEMS) ×3 IMPLANT
PAD PREP 24X41 OB/GYN DISP (PERSONAL CARE ITEMS) ×3 IMPLANT
SOL PREP PROV IODINE SCRUB 4OZ (MISCELLANEOUS) ×3 IMPLANT
SOL PREP PVP 2OZ (MISCELLANEOUS) ×3
SOLUTION PREP PVP 2OZ (MISCELLANEOUS) ×2 IMPLANT
SURGILUBE 2OZ TUBE FLIPTOP (MISCELLANEOUS) ×3 IMPLANT
SUT PDS 2-0 27IN (SUTURE) IMPLANT
SUT VIC AB 0 CT1 27 (SUTURE) ×6
SUT VIC AB 0 CT1 27XCR 8 STRN (SUTURE) ×4 IMPLANT
SUT VIC AB 0 CT1 36 (SUTURE) ×3 IMPLANT
SUT VIC AB 2-0 SH 27 (SUTURE) ×3
SUT VIC AB 2-0 SH 27XBRD (SUTURE) ×2 IMPLANT
SYR 10ML LL (SYRINGE) ×3 IMPLANT
SYR CONTROL 10ML LL (SYRINGE) ×3 IMPLANT
WATER STERILE IRR 1000ML POUR (IV SOLUTION) ×3 IMPLANT

## 2021-04-13 NOTE — Transfer of Care (Signed)
Immediate Anesthesia Transfer of Care Note  Patient: Holly Wagner  Procedure(s) Performed: HYSTERECTOMY VAGINAL (N/A ) BILATERAL SALPINGECTOMY (Bilateral ) INTRAUTERINE DEVICE (IUD) REMOVAL (N/A )  Patient Location: PACU  Anesthesia Type:General  Level of Consciousness: drowsy  Airway & Oxygen Therapy: Patient Spontanous Breathing  Post-op Assessment: Report given to RN and Post -op Vital signs reviewed and stable  Post vital signs: Reviewed and stable  Last Vitals:  Vitals Value Taken Time  BP 133/82 04/13/21 0912  Temp    Pulse 80   Resp 22 04/13/21 0912  SpO2 95   Vitals shown include unvalidated device data.  Last Pain:  Vitals:   04/13/21 0616  TempSrc: Oral  PainSc: 5          Complications: No complications documented.

## 2021-04-13 NOTE — Anesthesia Preprocedure Evaluation (Signed)
Anesthesia Evaluation  Patient identified by MRN, date of birth, ID band Patient awake    Reviewed: Allergy & Precautions, H&P , NPO status , Patient's Chart, lab work & pertinent test results, reviewed documented beta blocker date and time   History of Anesthesia Complications (+) PONV and history of anesthetic complications  Airway Mallampati: II  TM Distance: >3 FB Neck ROM: full    Dental  (+) Teeth Intact   Pulmonary asthma , former smoker,    Pulmonary exam normal        Cardiovascular Exercise Tolerance: Good negative cardio ROS Normal cardiovascular exam Rhythm:regular Rate:Normal     Neuro/Psych PSYCHIATRIC DISORDERS Anxiety Depression Bipolar Disorder negative neurological ROS     GI/Hepatic Neg liver ROS, GERD  Medicated,  Endo/Other  negative endocrine ROS  Renal/GU negative Renal ROS  negative genitourinary   Musculoskeletal   Abdominal   Peds  Hematology negative hematology ROS (+)   Anesthesia Other Findings Past Medical History: No date: ADD (attention deficit disorder) No date: Anxiety and depression No date: Asthma No date: Bipolar 1 disorder (HCC) 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 No date: GERD (gastroesophageal reflux disease) No date: Mental disorder No date: PONV (postoperative nausea and vomiting) No date: Pre-diabetes Past Surgical History: 2007: CHOLECYSTECTOMY 1992: fatty tumor removal No date: SINUS SURGERY WITH INSTATRAK     Comment:  unsure, maybe 4 years ago No date: WISDOM TOOTH EXTRACTION BMI    Body Mass Index: 39.22 kg/m     Reproductive/Obstetrics negative OB ROS                             Anesthesia Physical Anesthesia Plan  ASA: II  Anesthesia Plan: General ETT   Post-op Pain Management:    Induction:   PONV Risk Score and Plan: 4 or greater  Airway Management Planned:   Additional Equipment:   Intra-op Plan:   Post-operative  Plan:   Informed Consent: I have reviewed the patients History and Physical, chart, labs and discussed the procedure including the risks, benefits and alternatives for the proposed anesthesia with the patient or authorized representative who has indicated his/her understanding and acceptance.     Dental Advisory Given  Plan Discussed with: CRNA  Anesthesia Plan Comments:         Anesthesia Quick Evaluation

## 2021-04-13 NOTE — Anesthesia Procedure Notes (Signed)
Procedure Name: Intubation Date/Time: 04/13/2021 7:50 AM Performed by: Allean Found, CRNA Pre-anesthesia Checklist: Patient identified, Patient being monitored, Timeout performed, Emergency Drugs available and Suction available Patient Re-evaluated:Patient Re-evaluated prior to induction Oxygen Delivery Method: Circle system utilized Preoxygenation: Pre-oxygenation with 100% oxygen Induction Type: IV induction Ventilation: Mask ventilation without difficulty and Oral airway inserted - appropriate to patient size Laryngoscope Size: 3 and McGraph Grade View: Grade I Tube type: Oral Tube size: 7.0 mm Number of attempts: 1 Airway Equipment and Method: Stylet Placement Confirmation: ETT inserted through vocal cords under direct vision,  positive ETCO2 and breath sounds checked- equal and bilateral Secured at: 21 cm Tube secured with: Tape Dental Injury: Teeth and Oropharynx as per pre-operative assessment

## 2021-04-13 NOTE — Discharge Instructions (Signed)

## 2021-04-13 NOTE — Progress Notes (Signed)
   04/13/21 0740  Clinical Encounter Type  Visited With Patient  Visit Type Initial;Spiritual support;Social support  Referral From Chaplain  Consult/Referral To [employer] visited with Holly Wagner while doing rounds. Chaplain provided emotional and spiritual support. PT expressed her feelings, and stated she felt like she was in good hands. Chaplain ministered with presence and reflective listening.

## 2021-04-13 NOTE — Anesthesia Postprocedure Evaluation (Signed)
Anesthesia Post Note  Patient: Holly Wagner  Procedure(s) Performed: HYSTERECTOMY VAGINAL (N/A ) BILATERAL SALPINGECTOMY (Bilateral ) INTRAUTERINE DEVICE (IUD) REMOVAL (N/A )  Patient location during evaluation: PACU Anesthesia Type: General Level of consciousness: awake and alert and oriented Pain management: pain level controlled Vital Signs Assessment: post-procedure vital signs reviewed and stable Respiratory status: spontaneous breathing, nonlabored ventilation and respiratory function stable Cardiovascular status: blood pressure returned to baseline and stable Postop Assessment: no signs of nausea or vomiting Anesthetic complications: no   No complications documented.   Last Vitals:  Vitals:   04/13/21 1045 04/13/21 1110  BP: 122/81 129/81  Pulse: 92 98  Resp: 16 14  Temp: 37.2 C 36.4 C  SpO2: 99% 98%    Last Pain:  Vitals:   04/13/21 1110  TempSrc: Temporal  PainSc: 3                  Avin Gibbons

## 2021-04-13 NOTE — Brief Op Note (Signed)
04/13/2021  8:59 AM  PATIENT:  Holly Wagner  30 y.o. female  PRE-OPERATIVE DIAGNOSIS:  Menorrhagia, dysmenorrhea  POST-OPERATIVE DIAGNOSIS:  Menorrhagia, dysmenorrhea  PROCEDURE:  Procedure(s): HYSTERECTOMY VAGINAL (N/A) BILATERAL SALPINGECTOMY (Bilateral) INTRAUTERINE DEVICE (IUD) REMOVAL (N/A)  SURGEON:  Surgeon(s) and Role:    * Iria Jamerson, Gwen Her, MD - Primary    * Benjaman Kindler, MD - Assisting  PHYSICIAN ASSISTANT: CST  ASSISTANTS: none   ANESTHESIA:   general  EBL:  120 mL IOF 600 cc  UO 125 cc  BLOOD ADMINISTERED:none  DRAINS: none   LOCAL MEDICATIONS USED:  LIDOCAINE   SPECIMEN:  Source of Specimen:  cervix , uterus  and bialteral fallopian tubes   DISPOSITION OF SPECIMEN:  PATHOLOGY  COUNTS:  YES  TOURNIQUET:  * No tourniquets in log *  DICTATION: .Other Dictation: Dictation Number verbal  PLAN OF CARE: Discharge to home after PACU  PATIENT DISPOSITION:  PACU - hemodynamically stable.   Delay start of Pharmacological VTE agent (>24hrs) due to surgical blood loss or risk of bleeding: not applicable

## 2021-04-13 NOTE — Progress Notes (Signed)
Pt is scheduled for a TVH and bilateral salpingectomy  For menorrhagia . LAbs reviewed  hcg negative  All questions answered . Proceed

## 2021-04-14 NOTE — Op Note (Signed)
[name], WEBER MEDICAL RECORD NO: [id number] ACCOUNT NO: [id number] DATE OF BIRTH: 1991/04/25 FACILITY: [facility] LOCATION: [facility] PHYSICIAN: Boykin Nearing, MD  Operative Report   DATE OF PROCEDURE: 04/13/2021  PREOPERATIVE DIAGNOSES: 1.  Menorrhagia, failing conservative treatment. 2.  Dysmenorrhea. 3.  Dyspareunia.  POSTOPERATIVE DIAGNOSES: 1.  Menorrhagia, failing conservative treatment. 2.  Dysmenorrhea. 3.  Dyspareunia. 4.  Left ovarian benign cyst.  PROCEDURE: 1.  Total vaginal hysterectomy. 2.  Bilateral salpingectomy. 3.  Left ovarian cystotomy. 4.  ParaGard IUD removal.  ANESTHESIA:  General endotracheal anesthesia.  SURGEON:  Boykin Nearing, MD  FIRST ASSISTANT:  Benjaman Kindler, MD INDICATION:  A 30 year old gravida 2, para 2 patient with a long history of menorrhagia, failing conservative treatment.  The patient elects for definitive surgery.  DESCRIPTION OF PROCEDURE:  After adequate general endotracheal anesthesia, the patient was placed in the dorsal supine position with the legs in the candy cane stirrups.  The patient's lower abdomen, perineum and vagina were prepped and draped in normal  sterile fashion.  The patient received 2 grams IV Ancef for surgical prophylaxis.  A timeout was performed.  Straight catheterization of the bladder yielded 100 mL clear urine.  A weighted speculum was placed in the posterior vaginal vault and the cervix  was grasped with 2 thyroid tenacula.  Cervix was then circumferentially injected with 1% lidocaine with 1:100,000 epinephrine.  A direct posterior colpotomy incision was made upon entry into the posterior cul-de-sac.  A long weighted speculum was placed  within.  The uterosacral ligaments were bilaterally clamped, transected and suture ligated with 0 Vicryl suture.  Anterior cervix was circumferentially incised with the Bovie.  The cardinal ligaments were then bilaterally clamped, transected and  suture  ligated with 0 Vicryl suture.  The anterior cul-de-sac was entered sharply without difficulty.  The cardinal ligaments were then bilaterally clamped, transected and suture ligated with 0 Vicryl suture.  The anterior cervix was circumferentially incised  with the Bovie and the anterior cul-de-sac was entered sharply.  The uterine arteries were bilaterally clamped, transected and suture ligated with 0 Vicryl suture.  Sequential clamping on the lateral aspect of the uterus, clamping, cutting and tying.   The cornua then were bilaterally clamped, transected and the uterus and cervix were delivered.  Cornual pedicles were doubly ligated with 0 Vicryl suture.  Of note, there was a 5 x 4 cm clear domed left ovarian cyst, which was drained with the cystotomy  incision.  Good hemostasis was noted.  The peritoneum was then closed with a 2-0 PDS suture in a pursestring fashion.  Each fallopian tube was grasped at the distal portion and clamped and the distal portion of each fallopian tube was removed and pedicle  was secured with 0 Vicryl suture.  The peritoneum was then closed in a pursestring fashion with 2-0 PDS suture and the vaginal cuff was then closed with a running 0 Vicryl suture with reapproximation of the uterosacral ligaments centrally.  The rest of  the vaginal vault was closed with the 0 Vicryl suture.  Good hemostasis was noted.  Straight catheterization of the bladder at the end of the case yielded additional 25 mL clear urine.  The patient did receive 30 mg intravenous Toradol at the end of the  case.  There were no complications.  ESTIMATED BLOOD LOSS:  120 mL  INTRAOPERATIVE FLUIDS:  600 mL.  URINE OUTPUT:  125 mL.  The patient was taken to recovery room in good condition.  SHW D: 04/13/2021 9:44:37 am T: 04/14/2021 1:40:00 am  JOB: [id number]/ [id number]

## 2021-04-17 LAB — SURGICAL PATHOLOGY

## 2021-06-26 DIAGNOSIS — R42 Dizziness and giddiness: Secondary | ICD-10-CM | POA: Insufficient documentation

## 2021-06-26 DIAGNOSIS — H9313 Tinnitus, bilateral: Secondary | ICD-10-CM | POA: Insufficient documentation

## 2021-10-31 ENCOUNTER — Other Ambulatory Visit: Payer: Self-pay

## 2021-10-31 ENCOUNTER — Encounter: Payer: Self-pay | Admitting: Emergency Medicine

## 2021-10-31 ENCOUNTER — Emergency Department
Admission: EM | Admit: 2021-10-31 | Discharge: 2021-10-31 | Disposition: A | Discharge status: Home / Self Care | Payer: Medicaid Other | Attending: Emergency Medicine | Admitting: Emergency Medicine

## 2021-10-31 DIAGNOSIS — R42 Dizziness and giddiness: Secondary | ICD-10-CM | POA: Diagnosis present

## 2021-10-31 DIAGNOSIS — R519 Headache, unspecified: Secondary | ICD-10-CM | POA: Insufficient documentation

## 2021-10-31 DIAGNOSIS — R410 Disorientation, unspecified: Secondary | ICD-10-CM | POA: Insufficient documentation

## 2021-10-31 DIAGNOSIS — Z5321 Procedure and treatment not carried out due to patient leaving prior to being seen by health care provider: Secondary | ICD-10-CM | POA: Insufficient documentation

## 2021-10-31 LAB — BASIC METABOLIC PANEL
Anion gap: 7 (ref 5–15)
BUN: 15 mg/dL (ref 6–20)
CO2: 28 mmol/L (ref 22–32)
Calcium: 8.3 mg/dL — ABNORMAL LOW (ref 8.9–10.3)
Chloride: 103 mmol/L (ref 98–111)
Creatinine, Ser: 0.88 mg/dL (ref 0.44–1.00)
GFR, Estimated: >60 mL/min (ref >60)
Glucose, Bld: 114 mg/dL — ABNORMAL HIGH (ref 70–99)
Potassium: 5.3 mmol/L — ABNORMAL HIGH (ref 3.5–5.1)
Sodium: 138 mmol/L (ref 135–145)

## 2021-10-31 LAB — CBC
HCT: 40.2 % (ref 36.0–46.0)
Hemoglobin: 12.8 g/dL (ref 12.0–15.0)
MCH: 28.6 pg (ref 26.0–34.0)
MCHC: 31.8 g/dL (ref 30.0–36.0)
MCV: 89.9 fL (ref 80.0–100.0)
Platelets: 365 10*3/uL (ref 150–400)
RBC: 4.47 MIL/uL (ref 3.87–5.11)
RDW: 12.8 % (ref 11.5–15.5)
WBC: 11.6 10*3/uL — ABNORMAL HIGH (ref 4.0–10.5)
nRBC: 0 % (ref 0.0–0.2)

## 2021-10-31 NOTE — ED Triage Notes (Signed)
Pt comes into the ED via [facility] clinic for dizziness.   Pt states she has been following the neurologist for the past year thinking she had vertigo and now they have ruled that out and they are attributing it to migraine.  Pt is currently on amitriptyline for the headaches and she has received her first occipital nerve injection a couple weeks ago. Pt is able to answer all questions appropriately and has even and unlabored respirations.  Pt states that she has now had stuttering and confusion for the past couple days. Pt has already had an MRI of her brain a couple months ago.

## 2022-01-29 DIAGNOSIS — J3501 Chronic tonsillitis: Secondary | ICD-10-CM | POA: Insufficient documentation

## 2022-01-29 DIAGNOSIS — J351 Hypertrophy of tonsils: Secondary | ICD-10-CM | POA: Insufficient documentation

## 2022-01-29 DIAGNOSIS — R070 Pain in throat: Secondary | ICD-10-CM | POA: Insufficient documentation

## 2022-02-27 ENCOUNTER — Other Ambulatory Visit: Payer: Self-pay

## 2022-02-27 ENCOUNTER — Encounter (HOSPITAL_COMMUNITY): Payer: Self-pay

## 2022-02-27 ENCOUNTER — Emergency Department (HOSPITAL_COMMUNITY)
Admission: EM | Admit: 2022-02-27 | Discharge: 2022-02-27 | Disposition: A | Discharge status: Home / Self Care | Payer: Medicaid Other | Attending: Emergency Medicine | Admitting: Emergency Medicine

## 2022-02-27 ENCOUNTER — Emergency Department (HOSPITAL_COMMUNITY): Payer: Medicaid Other

## 2022-02-27 DIAGNOSIS — J45909 Unspecified asthma, uncomplicated: Secondary | ICD-10-CM | POA: Insufficient documentation

## 2022-02-27 DIAGNOSIS — A084 Viral intestinal infection, unspecified: Secondary | ICD-10-CM | POA: Diagnosis not present

## 2022-02-27 DIAGNOSIS — E876 Hypokalemia: Secondary | ICD-10-CM | POA: Diagnosis not present

## 2022-02-27 DIAGNOSIS — R6883 Chills (without fever): Secondary | ICD-10-CM | POA: Insufficient documentation

## 2022-02-27 DIAGNOSIS — D72829 Elevated white blood cell count, unspecified: Secondary | ICD-10-CM | POA: Diagnosis not present

## 2022-02-27 DIAGNOSIS — R000 Tachycardia, unspecified: Secondary | ICD-10-CM | POA: Diagnosis not present

## 2022-02-27 DIAGNOSIS — R1032 Left lower quadrant pain: Secondary | ICD-10-CM | POA: Diagnosis present

## 2022-02-27 DIAGNOSIS — R1084 Generalized abdominal pain: Secondary | ICD-10-CM

## 2022-02-27 LAB — CBC WITH DIFFERENTIAL/PLATELET
Abs Immature Granulocytes: 0.11 10*3/uL — ABNORMAL HIGH (ref 0.00–0.07)
Basophils Absolute: 0.1 10*3/uL (ref 0.0–0.1)
Basophils Relative: 0 %
Eosinophils Absolute: 0 10*3/uL (ref 0.0–0.5)
Eosinophils Relative: 0 %
HCT: 37.6 % (ref 36.0–46.0)
Hemoglobin: 12.1 g/dL (ref 12.0–15.0)
Immature Granulocytes: 1 %
Lymphocytes Relative: 21 %
Lymphs Abs: 4.2 10*3/uL — ABNORMAL HIGH (ref 0.7–4.0)
MCH: 27.3 pg (ref 26.0–34.0)
MCHC: 32.2 g/dL (ref 30.0–36.0)
MCV: 84.9 fL (ref 80.0–100.0)
Monocytes Absolute: 0.8 10*3/uL (ref 0.1–1.0)
Monocytes Relative: 4 %
Neutro Abs: 15.2 10*3/uL — ABNORMAL HIGH (ref 1.7–7.7)
Neutrophils Relative %: 74 %
Platelets: 398 10*3/uL (ref 150–400)
RBC: 4.43 MIL/uL (ref 3.87–5.11)
RDW: 13.9 % (ref 11.5–15.5)
WBC: 20.3 10*3/uL — ABNORMAL HIGH (ref 4.0–10.5)
nRBC: 0 % (ref 0.0–0.2)

## 2022-02-27 LAB — URINALYSIS, ROUTINE W REFLEX MICROSCOPIC
Bilirubin Urine: NEGATIVE
Glucose, UA: NEGATIVE mg/dL
Ketones, ur: 15 mg/dL — AB
Leukocytes,Ua: NEGATIVE
Nitrite: NEGATIVE
Specific Gravity, Urine: 1.015 (ref 1.005–1.030)
pH: 7 (ref 5.0–8.0)

## 2022-02-27 LAB — COMPREHENSIVE METABOLIC PANEL
ALT: 17 U/L (ref 0–44)
AST: 15 U/L (ref 15–41)
Albumin: 3.5 g/dL (ref 3.5–5.0)
Alkaline Phosphatase: 50 U/L (ref 38–126)
Anion gap: 10 (ref 5–15)
BUN: 22 mg/dL — ABNORMAL HIGH (ref 6–20)
CO2: 26 mmol/L (ref 22–32)
Calcium: 8.5 mg/dL — ABNORMAL LOW (ref 8.9–10.3)
Chloride: 98 mmol/L (ref 98–111)
Creatinine, Ser: 1 mg/dL (ref 0.44–1.00)
GFR, Estimated: >60 mL/min (ref >60)
Glucose, Bld: 175 mg/dL — ABNORMAL HIGH (ref 70–99)
Potassium: 3.4 mmol/L — ABNORMAL LOW (ref 3.5–5.1)
Sodium: 134 mmol/L — ABNORMAL LOW (ref 135–145)
Total Bilirubin: 0.5 mg/dL (ref 0.3–1.2)
Total Protein: 7 g/dL (ref 6.5–8.1)

## 2022-02-27 LAB — URINALYSIS, MICROSCOPIC (REFLEX)

## 2022-02-27 LAB — I-STAT BETA HCG BLOOD, ED (MC, WL, AP ONLY): I-stat hCG, quantitative: <5 m[IU]/mL (ref <5)

## 2022-02-27 LAB — LIPASE, BLOOD: Lipase: 33 U/L (ref 11–51)

## 2022-02-27 MED ORDER — HYDROMORPHONE HCL 1 MG/ML IJ SOLN
0.5000 mg | Freq: Once | INTRAMUSCULAR | Status: AC
  Administered 2022-02-27: 0.5 mg via INTRAVENOUS
  Filled 2022-02-27: qty 1

## 2022-02-27 MED ORDER — ONDANSETRON 4 MG PO TBDP
4.0000 mg | ORAL_TABLET | Freq: Once | ORAL | Status: AC
  Administered 2022-02-27: 4 mg via ORAL
  Filled 2022-02-27: qty 1

## 2022-02-27 MED ORDER — HYDROMORPHONE HCL 2 MG/ML IJ SOLN
2.0000 mg | Freq: Once | INTRAMUSCULAR | Status: AC
  Administered 2022-02-27: 2 mg via INTRAVENOUS
  Filled 2022-02-27: qty 1

## 2022-02-27 MED ORDER — SODIUM CHLORIDE 0.9 % IV BOLUS
1000.0000 mL | Freq: Once | INTRAVENOUS | Status: AC
Start: 2022-02-27 — End: 2022-02-27
  Administered 2022-02-27: 1000 mL via INTRAVENOUS

## 2022-02-27 MED ORDER — DICYCLOMINE HCL 20 MG PO TABS: 20.0000 mg | ORAL_TABLET | Freq: Two times a day (BID) | ORAL | 0 refills | Status: DC

## 2022-02-27 MED ORDER — ONDANSETRON 4 MG PO TBDP: 4.0000 mg | ORAL_TABLET | Freq: Three times a day (TID) | ORAL | 0 refills | Status: DC | PRN

## 2022-02-27 MED ORDER — IOHEXOL 300 MG/ML  SOLN
100.0000 mL | Freq: Once | INTRAMUSCULAR | Status: AC | PRN
  Administered 2022-02-27: 100 mL via INTRAVENOUS

## 2022-02-27 NOTE — ED Triage Notes (Signed)
Pt reports with left sided abdominal pain since yesterday. Pt states that when she wipes she has bright red blood on the toilet paper, pt reports having a large liquid gray stool upon arriving to the ED. Pt also states that she has GI issues and is just waiting on an appt.  ?

## 2022-02-27 NOTE — ED Provider Notes (Signed)
?[facility] DEPT ?Provider Note ? ? ?CSN: [id number] ?Arrival date & time: 02/27/22  1906 ? ?  ? ?History ? ?Chief Complaint  ?Patient presents with  ? Abdominal Pain  ? ? ?Holly Wagner is a 31 y.o. female. ? ?31 year old female with past medical history of IBS, dumping syndrome, hemorrhoids presents today for evaluation of left lower quadrant abdominal pain of several day duration.  Patient reports her pain significantly worsened today.  She endorses chills.  She denies chest pain, shortness of breath, dysuria, vaginal discharge.  She denies prior history of diverticulitis.  She states today she has had bright red blood on wiping following her bowel movements.  She denies any bright red blood in her stool, or melanotic stools.  She endorses nonbloody, nonbilious emesis. ? ?The history is provided by the patient. No language interpreter was used.  ? ?  ? ?Home Medications ?Prior to Admission medications   ?Medication Sig Start Date End Date Taking? Authorizing Provider  ?albuterol (VENTOLIN HFA) 108 (90 Base) MCG/ACT inhaler Inhale 2 puffs into the lungs every 4 (four) hours as needed (Asthma). 08/09/20   [provider]  ?colestipol (COLESTID) 1 g tablet Take 2 g by mouth at bedtime. 01/20/21   [provider]  ?DULoxetine (CYMBALTA) 20 MG capsule Take 20 mg by mouth at bedtime.    [provider]  ?fluticasone (FLONASE) 50 MCG/ACT nasal spray Place 1 spray into both nostrils daily as needed for allergies. 01/24/21   [provider]  ?omeprazole (PRILOSEC) 40 MG capsule Take 40 mg by mouth at bedtime.    [provider]  ?prochlorperazine (COMPAZINE) 5 MG tablet Take 5 mg by mouth every 6 (six) hours as needed for nausea/vomiting. 02/21/21   [provider]  ?traZODone (DESYREL) 50 MG tablet Take 50 mg by mouth at bedtime.    [provider]  ?   ? ?Allergies    ?Morphine and related   ? ?Review of Systems   ?Review of Systems   ?Constitutional:  Negative for chills and fever.  ?Respiratory:  Negative for shortness of breath.   ?Cardiovascular:  Negative for chest pain and palpitations.  ?Gastrointestinal:  Positive for abdominal pain, nausea and vomiting. Negative for blood in stool.  ?Genitourinary:  Negative for dysuria.  ?Neurological:  Negative for weakness and headaches.  ?All other systems reviewed and are negative. ? ?Physical Exam ?Updated Vital Signs ?BP 130/74   Pulse (!) 106   Temp 98.3 ?F (36.8 ?C) (Oral)   Resp 20   Ht '5\' 6"'$  (1.676 m)   Wt 117.9 kg   LMP 04/12/2021 (Exact Date)   SpO2 100%   BMI 41.97 kg/m?  ?Physical Exam ?Vitals and nursing note reviewed.  ?Constitutional:   ?   General: She is not in acute distress. ?   Appearance: Normal appearance. She is not ill-appearing.  ?HENT:  ?   Head: Normocephalic and atraumatic.  ?   Nose: Nose normal.  ?Eyes:  ?   General: No scleral icterus. ?   Extraocular Movements: Extraocular movements intact.  ?   Conjunctiva/sclera: Conjunctivae normal.  ?Cardiovascular:  ?   Rate and Rhythm: Regular rhythm. Tachycardia present.  ?   Pulses: Normal pulses.  ?   Heart sounds: Normal heart sounds.  ?Pulmonary:  ?   Effort: Pulmonary effort is normal. No respiratory distress.  ?   Breath sounds: Normal breath sounds. No wheezing or rales.  ?Abdominal:  ?   General:  There is no distension.  ?   Palpations: Abdomen is soft.  ?   Tenderness: There is abdominal tenderness. There is no right CVA tenderness, left CVA tenderness or guarding.  ?Musculoskeletal:     ?   General: Normal range of motion.  ?   Cervical back: Normal range of motion.  ?Skin: ?   General: Skin is warm and dry.  ?Neurological:  ?   General: No focal deficit present.  ?   Mental Status: She is alert. Mental status is at baseline.  ? ? ?ED Results / Procedures / Treatments   ?Labs ?(all labs ordered are listed, but only abnormal results are displayed) ?Labs Reviewed  ?COMPREHENSIVE METABOLIC PANEL - Abnormal;  Notable for the following components:  ?    Result Value  ? Sodium 134 (*)   ? Potassium 3.4 (*)   ? Glucose, Bld 175 (*)   ? BUN 22 (*)   ? Calcium 8.5 (*)   ? All other components within normal limits  ?CBC WITH DIFFERENTIAL/PLATELET - Abnormal; Notable for the following components:  ? WBC 20.3 (*)   ? Neutro Abs 15.2 (*)   ? Lymphs Abs 4.2 (*)   ? Abs Immature Granulocytes 0.11 (*)   ? All other components within normal limits  ?URINALYSIS, ROUTINE W REFLEX MICROSCOPIC - Abnormal; Notable for the following components:  ? Hgb urine dipstick SMALL (*)   ? Ketones, ur 15 (*)   ? Protein, ur TRACE (*)   ? All other components within normal limits  ?URINALYSIS, MICROSCOPIC (REFLEX) - Abnormal; Notable for the following components:  ? Bacteria, UA FEW (*)   ? All other components within normal limits  ?LIPASE, BLOOD  ?I-STAT BETA HCG BLOOD, ED ([facility], [facility], [facility] ONLY)  ? ? ?EKG ?None ? ?Radiology ?No results found. ? ?Procedures ?Procedures  ? ? ?Medications Ordered in ED ?Medications  ?ondansetron (ZOFRAN-ODT) disintegrating tablet 4 mg (4 mg Oral Given 02/27/22 1941)  ?HYDROmorphone (DILAUDID) injection 2 mg (2 mg Intravenous Given 02/27/22 2032)  ?iohexol (OMNIPAQUE) 300 MG/ML solution 100 mL (100 mLs Intravenous Contrast Given 02/27/22 2042)  ? ? ?ED Course/ Medical Decision Making/ A&P ?  ?                        ?Medical Decision Making ?Amount and/or Complexity of Data Reviewed ?Labs: ordered. ?Radiology: ordered. ? ?Risk ?Prescription drug management. ? ? ?Medical Decision Making / ED Course ? ? ?This patient presents to the ED for concern of abdominal pain, this involves an extensive number of treatment options, and is a complaint that carries with it a high risk of complications and morbidity.  The differential diagnosis includes diverticulitis, pancreatitis, appendicitis, small bowel obstruction ? ?MDM: ?31 year old female presents today for evaluation of left lower quadrant abdominal pain of several day duration that  significantly worsened today.  She endorses nonbilious, nonbloody emesis.  She endorses occasional chills prior to arrival.  Denies known fever.  She does have history of hemorrhoids, dumping syndrome, IBS.  She follows with [facility].  She has left lower quadrant tenderness. ?CBC with leukocytosis of 20 with left shift of 15.  Without anemia.  CMP with mild hypokalemia of 3.4, glucose of 175 otherwise without acute findings.  UA without UTI.  Lipase within normal limits.  CT abdomen pelvis still pending. ?CT abdomen pelvis without acute intra-abdominal process.  Without evidence of small bowel obstruction, or diverticulitis.  Hydrosalpinx present which was  present on previous imaging as well.  Abdominal pain significantly improved following pain medication.  Patient is yet to receive IV hydration.  Remains afebrile.  Patient likely has viral gastroenteritis on top of her chronic IBS, dumping syndrome.  This is unlikely to be an acute intra-abdominal process.  Will provide IV hydration.  Patient is appropriate for discharge following IV hydration.  Return precautions discussed.  Discussed importance of follow-up with PCP and gastroenterology outpatient.  She is established with [facility]. ? ?Lab Tests: ?-I ordered, reviewed, and interpreted labs.   ?The pertinent results include:   ?Labs Reviewed  ?COMPREHENSIVE METABOLIC PANEL - Abnormal; Notable for the following components:  ?    Result Value  ? Sodium 134 (*)   ? Potassium 3.4 (*)   ? Glucose, Bld 175 (*)   ? BUN 22 (*)   ? Calcium 8.5 (*)   ? All other components within normal limits  ?CBC WITH DIFFERENTIAL/PLATELET - Abnormal; Notable for the following components:  ? WBC 20.3 (*)   ? Neutro Abs 15.2 (*)   ? Lymphs Abs 4.2 (*)   ? Abs Immature Granulocytes 0.11 (*)   ? All other components within normal limits  ?URINALYSIS, ROUTINE W REFLEX MICROSCOPIC - Abnormal; Notable for the following components:  ? Hgb urine dipstick SMALL (*)   ?  Ketones, ur 15 (*)   ? Protein, ur TRACE (*)   ? All other components within normal limits  ?URINALYSIS, MICROSCOPIC (REFLEX) - Abnormal; Notable for the following components:  ? Bacteria, UA FEW (*)   ? All othe

## 2022-02-27 NOTE — ED Notes (Signed)
Patient placed on Cardiac Monitor

## 2022-02-27 NOTE — Discharge Instructions (Signed)
Your work-up today was reassuring and without any signs or symptoms of acute process causing your abdominal pain.  You received IV pain medication and IV hydration in the emergency room with improvement in your symptoms.  If you have worsening symptoms please return to the emergency room.  Otherwise follow-up with your primary care provider and your gastroenterologist.  Your symptoms could be explained by a viral gastroenteritis on top of your underlying IBS.  Ensure you are drinking plenty of fluids. ?

## 2022-08-27 DIAGNOSIS — E119 Type 2 diabetes mellitus without complications: Secondary | ICD-10-CM | POA: Insufficient documentation

## 2023-03-18 ENCOUNTER — Other Ambulatory Visit: Payer: Self-pay

## 2023-03-18 ENCOUNTER — Emergency Department: Payer: BLUE CROSS/BLUE SHIELD

## 2023-03-18 ENCOUNTER — Emergency Department
Admission: EM | Admit: 2023-03-18 | Discharge: 2023-03-18 | Disposition: A | Discharge status: Home / Self Care | Payer: BLUE CROSS/BLUE SHIELD | Attending: Emergency Medicine | Admitting: Emergency Medicine

## 2023-03-18 DIAGNOSIS — M542 Cervicalgia: Secondary | ICD-10-CM | POA: Diagnosis present

## 2023-03-18 DIAGNOSIS — M436 Torticollis: Secondary | ICD-10-CM | POA: Insufficient documentation

## 2023-03-18 MED ORDER — DIAZEPAM 2 MG PO TABS
2.0000 mg | ORAL_TABLET | Freq: Once | ORAL | Status: AC
  Administered 2023-03-18: 2 mg via ORAL
  Filled 2023-03-18: qty 1

## 2023-03-18 MED ORDER — ACETAMINOPHEN 325 MG PO TABS
650.0000 mg | ORAL_TABLET | Freq: Once | ORAL | Status: AC
  Administered 2023-03-18: 650 mg via ORAL
  Filled 2023-03-18: qty 2

## 2023-03-18 MED ORDER — KETOROLAC TROMETHAMINE 15 MG/ML IJ SOLN
15.0000 mg | Freq: Once | INTRAMUSCULAR | Status: AC
  Administered 2023-03-18: 15 mg via INTRAMUSCULAR

## 2023-03-18 MED ORDER — DIAZEPAM 2 MG PO TABS: 2.0000 mg | ORAL_TABLET | Freq: Every day | ORAL | 0 refills | Status: AC | PRN

## 2023-03-18 MED ORDER — KETOROLAC TROMETHAMINE 15 MG/ML IJ SOLN
15.0000 mg | Freq: Once | INTRAMUSCULAR | Status: DC
  Filled 2023-03-18: qty 1

## 2023-03-18 NOTE — ED Notes (Signed)
Pt sobbing, crying states "the medicine has worn off". Pt requesting additional pain meds. Jackie woods, pa notified and in to speak with pt.

## 2023-03-18 NOTE — ED Provider Notes (Signed)
[facility] Provider Note  Patient Contact: 10:54 PM (approximate)   History   Neck Pain   HPI  Holly Wagner is a 32 y.o. female presents to the emergency department with neck stiffness over the past several months that worsened acutely over the past 2 to 3 days.  Patient reports that she typically is under the care of a chiropractor but has not been able to see her chiropractor due to cost.  No fever or headache at home.  No numbness or tingling in the upper and lower extremities.  No chest pain or abdominal pain.      Physical Exam   Triage Vital Signs: ED Triage Vitals [03/18/23 2053]  Enc Vitals Group     BP (!) 156/107     Pulse Rate 91     Resp 18     Temp 98.5 F (36.9 C)     Temp Source Oral     SpO2 97 %     Weight 250 lb (113.4 kg)     Height 5\' 6"  (1.676 m)     Head Circumference      Peak Flow      Pain Score 9     Pain Loc      Pain Edu?      Excl. in [place]?     Most recent vital signs: Vitals:   03/18/23 2053  BP: (!) 156/107  Pulse: 91  Resp: 18  Temp: 98.5 F (36.9 C)  SpO2: 97%     General: Alert and in no acute distress. Eyes:  PERRL. EOMI. Head: No acute traumatic findings ENT:      Nose: No congestion/rhinnorhea.      Mouth/Throat: Mucous membranes are moist.  Neck: No stridor. No cervical spine tenderness to palpation.  Patient has paraspinal muscle tenderness along the cervical spine on the right. Cardiovascular:  Good peripheral perfusion Respiratory: Normal respiratory effort without tachypnea or retractions. Lungs CTAB. Good air entry to the bases with no decreased or absent breath sounds. Gastrointestinal: Bowel sounds 4 quadrants. Soft and nontender to palpation. No guarding or rigidity. No palpable masses. No distention. No CVA tenderness. Musculoskeletal: Full range of motion to all extremities.  Neurologic:  No gross focal neurologic deficits are appreciated.  Skin:   No rash noted    ED  Results / Procedures / Treatments   Labs (all labs ordered are listed, but only abnormal results are displayed) Labs Reviewed - No data to display      RADIOLOGY  I personally viewed and evaluated these images as part of my medical decision making, as well as reviewing the written report by the radiologist.  ED Provider Interpretation:  No acute bony abnormality on x ray of the cervical spine.    PROCEDURES:  Critical Care performed: No  Procedures   MEDICATIONS ORDERED IN ED: Medications  diazepam (VALIUM) tablet 2 mg (2 mg Oral Given 03/18/23 2237)  ketorolac (TORADOL) 15 MG/ML injection 15 mg (15 mg Intramuscular Given 03/18/23 2238)     IMPRESSION / MDM / [place] / ED COURSE  I reviewed the triage vital signs and the nursing notes.                              Assessment and plan Neck pain 32 year old female presents to the emergency department with neck stiffness.  Patient was hypertensive at triage but vital signs were otherwise  reassuring.  On exam, patient alert, active and nontoxic-appearing with no neurodeficits noted.   X-ray of the cervical spine shows no acute abnormality.  Patient's neck stiffness improved with Toradol and Valium administered in the emergency department.  Patient was discharged with a 3-day course of Valium.  Return precautions were given to return with new or worsening symptoms.   FINAL CLINICAL IMPRESSION(S) / ED DIAGNOSES   Final diagnoses:  Torticollis     Rx / DC Orders   ED Discharge Orders          Ordered    diazepam (VALIUM) 2 MG tablet  Daily PRN        03/18/23 2312             Note:  This document was prepared using Dragon voice recognition software and may include unintentional dictation errors.   Pia Mau [place], PA-C 03/18/23 2317    Minna Antis, MD 03/24/23 [phone]

## 2023-03-18 NOTE — Discharge Instructions (Signed)
You can take 2 mg of Valium at night before bed for the next 3 nights.

## 2023-03-18 NOTE — ED Triage Notes (Signed)
Reports neck pain x several months. Reports seen by chiropractor and told "I have a lot of misalignment in my entire spine." States has not been adjusted in the last 2 months d/t cost. States pain increased in the last few days. Denies numbness or tingling in extremities. States cannot turn head either direction. Denies trauma or injury. Pt ambulatory to triage. Alert and oriented following commands. Breathing unlabored speaking in full sentences.

## 2023-03-24 ENCOUNTER — Emergency Department: Payer: BLUE CROSS/BLUE SHIELD

## 2023-03-24 ENCOUNTER — Other Ambulatory Visit: Payer: Self-pay

## 2023-03-24 ENCOUNTER — Emergency Department
Admission: EM | Admit: 2023-03-24 | Discharge: 2023-03-24 | Disposition: A | Discharge status: Home / Self Care | Payer: BLUE CROSS/BLUE SHIELD | Attending: Emergency Medicine | Admitting: Emergency Medicine

## 2023-03-24 DIAGNOSIS — J45909 Unspecified asthma, uncomplicated: Secondary | ICD-10-CM | POA: Insufficient documentation

## 2023-03-24 DIAGNOSIS — J069 Acute upper respiratory infection, unspecified: Secondary | ICD-10-CM | POA: Diagnosis not present

## 2023-03-24 DIAGNOSIS — Z1152 Encounter for screening for COVID-19: Secondary | ICD-10-CM | POA: Diagnosis not present

## 2023-03-24 DIAGNOSIS — R059 Cough, unspecified: Secondary | ICD-10-CM | POA: Diagnosis present

## 2023-03-24 LAB — SARS CORONAVIRUS 2 BY RT PCR: SARS Coronavirus 2 by RT PCR: NEGATIVE

## 2023-03-24 LAB — GROUP A STREP BY PCR: Group A Strep by PCR: NOT DETECTED

## 2023-03-24 MED ORDER — BENZONATATE 100 MG PO CAPS: 100.0000 mg | ORAL_CAPSULE | Freq: Three times a day (TID) | ORAL | 0 refills | Status: DC | PRN

## 2023-03-24 MED ORDER — AMOXICILLIN-POT CLAVULANATE 875-125 MG PO TABS
1.0000 | ORAL_TABLET | Freq: Once | ORAL | Status: AC
  Administered 2023-03-24: 1 via ORAL
  Filled 2023-03-24: qty 1

## 2023-03-24 MED ORDER — AMOXICILLIN 875 MG PO TABS: 875.0000 mg | ORAL_TABLET | Freq: Two times a day (BID) | ORAL | 0 refills | Status: DC

## 2023-03-24 MED ORDER — BENZONATATE 100 MG PO CAPS
100.0000 mg | ORAL_CAPSULE | Freq: Once | ORAL | Status: AC
  Administered 2023-03-24: 100 mg via ORAL
  Filled 2023-03-24: qty 1

## 2023-03-24 NOTE — Discharge Instructions (Signed)
Follow-up with your regular doctor if not improving in 3 days.  Return emergency department worsening.  Take medications as prescribed.  Also use your inhaler as this will help with the cough.

## 2023-03-24 NOTE — ED Provider Notes (Signed)
[facility] Provider Note    Event Date/Time   First MD Initiated Contact with Patient 03/24/23 [phone]     (approximate)   History   Cough   HPI  Holly Wagner is a 32 y.o. female with history of asthma, prediabetes presents emergency department with concerns of cough, congestion, sore throat and fever last night.  Patient states she feels like she may have pneumonia.  Does not know what her temp was last night just notes that she felt really hot.  Has not used her inhaler.  Uses Claritin for allergies.  No vomiting or diarrhea      Physical Exam   Triage Vital Signs: ED Triage Vitals [03/24/23 0718]  Enc Vitals Group     BP (!) 158/98     Pulse Rate (!) 102     Resp 18     Temp 98.6 F (37 C)     Temp Source Oral     SpO2 100 %     Weight      Height      Head Circumference      Peak Flow      Pain Score 7     Pain Loc      Pain Edu?      Excl. in [place]?     Most recent vital signs: Vitals:   03/24/23 0718  BP: (!) 158/98  Pulse: (!) 102  Resp: 18  Temp: 98.6 F (37 C)  SpO2: 100%     General: Awake, no distress.   CV:  Good peripheral perfusion. regular rate and  rhythm Resp:  Normal effort. Lungs cta Abd:  No distention.   Other:  Voice is hoarse, TMs clear bilaterally, throat is red and irritated   ED Results / Procedures / Treatments   Labs (all labs ordered are listed, but only abnormal results are displayed) Labs Reviewed  GROUP A STREP BY PCR  SARS CORONAVIRUS 2 BY RT PCR     EKG     RADIOLOGY Chest x-ray    PROCEDURES:   Procedures   MEDICATIONS ORDERED IN ED: Medications  amoxicillin-clavulanate (AUGMENTIN) 875-125 MG per tablet 1 tablet (1 tablet Oral Given 03/24/23 0854)  benzonatate (TESSALON) capsule 100 mg (100 mg Oral Given 03/24/23 0854)     IMPRESSION / MDM / ASSESSMENT AND PLAN / ED COURSE  I reviewed the triage vital signs and the nursing notes.                               Differential diagnosis includes, but is not limited to, COVID, CAP, acute URI, strep throat  Patient's presentation is most consistent with acute complicated illness / injury requiring diagnostic workup.   Patient appears to be very stable and there is no wheezing so do not feel that she needs a nebulizer treatment.  Will do chest x-ray along with COVID and strep test.   Chest x-ray independently reviewed and interpreted by me as being negative for any acute abnormality  Strep test is reassuring  Patient is coughing up green mucus so do feel that we will go ahead and place her on antibiotic and a cough medication.  Did offer steroids but patient states that since her into an emotional tailspin as she has bipolar.  Will not give her this medication at this time because she is not wheezing and feel that she can resolve this issue with  other treatment.  She is also to use her inhaler for cough and any difficulty breathing.  In agreement treatment plan.  She was discharged stable condition.  She was also given a dose of her antibiotic and a cough Perle while here as she will not be able to get her medications until tomorrow   FINAL CLINICAL IMPRESSION(S) / ED DIAGNOSES   Final diagnoses:  Acute URI     Rx / DC Orders   ED Discharge Orders          Ordered    amoxicillin (AMOXIL) 875 MG tablet  2 times daily        03/24/23 0842    benzonatate (TESSALON PERLES) 100 MG capsule  3 times daily PRN        03/24/23 9924             Note:  This document was prepared using Dragon voice recognition software and may include unintentional dictation errors.    Faythe Ghee, PA-C 03/24/23 2683    Jene Every, MD 03/24/23 [phone]

## 2023-03-24 NOTE — ED Triage Notes (Signed)
Pt to ED via POV from home. Pt reports productive cough, runny nose, sore throat and congestion x3 days.Pt repots chest tightness from coughing.

## 2023-06-07 ENCOUNTER — Emergency Department
Admission: EM | Admit: 2023-06-07 | Discharge: 2023-06-08 | Disposition: A | Discharge status: Home / Self Care | Payer: BLUE CROSS/BLUE SHIELD | Attending: Emergency Medicine | Admitting: Emergency Medicine

## 2023-06-07 ENCOUNTER — Other Ambulatory Visit: Payer: Self-pay

## 2023-06-07 ENCOUNTER — Emergency Department: Payer: BLUE CROSS/BLUE SHIELD

## 2023-06-07 DIAGNOSIS — R0981 Nasal congestion: Secondary | ICD-10-CM | POA: Insufficient documentation

## 2023-06-07 DIAGNOSIS — R051 Acute cough: Secondary | ICD-10-CM | POA: Diagnosis not present

## 2023-06-07 DIAGNOSIS — R0789 Other chest pain: Secondary | ICD-10-CM | POA: Diagnosis not present

## 2023-06-07 DIAGNOSIS — R5383 Other fatigue: Secondary | ICD-10-CM | POA: Insufficient documentation

## 2023-06-07 DIAGNOSIS — J3489 Other specified disorders of nose and nasal sinuses: Secondary | ICD-10-CM | POA: Insufficient documentation

## 2023-06-07 DIAGNOSIS — R509 Fever, unspecified: Secondary | ICD-10-CM | POA: Diagnosis not present

## 2023-06-07 DIAGNOSIS — R079 Chest pain, unspecified: Secondary | ICD-10-CM | POA: Diagnosis present

## 2023-06-07 DIAGNOSIS — J45909 Unspecified asthma, uncomplicated: Secondary | ICD-10-CM | POA: Diagnosis not present

## 2023-06-07 LAB — BASIC METABOLIC PANEL
Anion gap: 11 (ref 5–15)
BUN: 16 mg/dL (ref 6–20)
CO2: 23 mmol/L (ref 22–32)
Calcium: 8.8 mg/dL — ABNORMAL LOW (ref 8.9–10.3)
Chloride: 99 mmol/L (ref 98–111)
Creatinine, Ser: 0.92 mg/dL (ref 0.44–1.00)
GFR, Estimated: >60 mL/min (ref >60)
Glucose, Bld: 189 mg/dL — ABNORMAL HIGH (ref 70–99)
Potassium: 3.1 mmol/L — ABNORMAL LOW (ref 3.5–5.1)
Sodium: 133 mmol/L — ABNORMAL LOW (ref 135–145)

## 2023-06-07 LAB — CBC
HCT: 38.3 % (ref 36.0–46.0)
Hemoglobin: 12.3 g/dL (ref 12.0–15.0)
MCH: 26.6 pg (ref 26.0–34.0)
MCHC: 32.1 g/dL (ref 30.0–36.0)
MCV: 82.7 fL (ref 80.0–100.0)
Platelets: 388 10*3/uL (ref 150–400)
RBC: 4.63 MIL/uL (ref 3.87–5.11)
RDW: 14 % (ref 11.5–15.5)
WBC: 12.2 10*3/uL — ABNORMAL HIGH (ref 4.0–10.5)
nRBC: 0 % (ref 0.0–0.2)

## 2023-06-07 LAB — TROPONIN I (HIGH SENSITIVITY): Troponin I (High Sensitivity): 3 ng/L (ref <18)

## 2023-06-07 MED ORDER — ONDANSETRON 4 MG PO TBDP: 4.0000 mg | ORAL_TABLET | Freq: Three times a day (TID) | ORAL | 0 refills | Status: DC | PRN

## 2023-06-07 MED ORDER — HYDROCODONE BIT-HOMATROP MBR 5-1.5 MG/5ML PO SOLN: 5.0000 mL | Freq: Four times a day (QID) | ORAL | 0 refills | Status: DC | PRN

## 2023-06-07 MED ORDER — KETOROLAC TROMETHAMINE 30 MG/ML IJ SOLN
15.0000 mg | Freq: Once | INTRAMUSCULAR | Status: AC
  Administered 2023-06-07: 15 mg via INTRAVENOUS
  Filled 2023-06-07: qty 1

## 2023-06-07 MED ORDER — IPRATROPIUM-ALBUTEROL 0.5-2.5 (3) MG/3ML IN SOLN
3.0000 mL | Freq: Once | RESPIRATORY_TRACT | Status: AC
  Administered 2023-06-07: 3 mL via RESPIRATORY_TRACT
  Filled 2023-06-07: qty 3

## 2023-06-07 MED ORDER — DOXYCYCLINE HYCLATE 100 MG PO CAPS: 100.0000 mg | ORAL_CAPSULE | Freq: Two times a day (BID) | ORAL | 0 refills | Status: AC

## 2023-06-07 MED ORDER — DEXAMETHASONE SODIUM PHOSPHATE 10 MG/ML IJ SOLN
10.0000 mg | Freq: Once | INTRAMUSCULAR | Status: AC
  Administered 2023-06-07: 10 mg via INTRAVENOUS
  Filled 2023-06-07: qty 1

## 2023-06-07 MED ORDER — POTASSIUM CHLORIDE CRYS ER 20 MEQ PO TBCR
40.0000 meq | EXTENDED_RELEASE_TABLET | Freq: Once | ORAL | Status: AC
  Administered 2023-06-07: 40 meq via ORAL
  Filled 2023-06-07: qty 2

## 2023-06-07 MED ORDER — ALBUTEROL SULFATE HFA 108 (90 BASE) MCG/ACT IN AERS: 2.0000 | INHALATION_SPRAY | Freq: Four times a day (QID) | RESPIRATORY_TRACT | 0 refills | Status: AC | PRN

## 2023-06-07 MED ORDER — SODIUM CHLORIDE 0.9 % IV SOLN
100.0000 mg | Freq: Once | INTRAVENOUS | Status: AC
  Administered 2023-06-07: 100 mg via INTRAVENOUS
  Filled 2023-06-07: qty 100

## 2023-06-07 MED ORDER — LACTATED RINGERS IV BOLUS
1000.0000 mL | Freq: Once | INTRAVENOUS | Status: AC
  Administered 2023-06-07: 1000 mL via INTRAVENOUS

## 2023-06-07 NOTE — ED Provider Notes (Signed)
[facility] Provider Note    Event Date/Time   First MD Initiated Contact with Patient 06/07/23 2000     (approximate)   History   Chest Pain (Cough x 3 days)   HPI  Holly Wagner is a 32 y.o. female   with past medical history of bipolar disorder, asthma, GERD, dumping syndrome, here with cough, chest pain, general fatigue.  The patient states that over the last week, she has had sinus congestion, rhinorrhea.  She feels generally unwell.  She has had a cough.  She developed some aching, throbbing, right-sided chest pain, worse with coughing, as well as fatigue.  She has had a worsening ache with this.  She has been having intermittent fevers and chills.  She describes a pressure-like sensation in her chest.  No alleviating factors.  No leg swelling.  No other major medical complaints.      Physical Exam   Triage Vital Signs: ED Triage Vitals  Enc Vitals Group     BP 06/07/23 1828 (!) 142/100     Pulse Rate 06/07/23 1828 (!) 115     Resp 06/07/23 1828 16     Temp 06/07/23 1828 98 F (36.7 C)     Temp Source 06/07/23 1828 Oral     SpO2 06/07/23 1828 98 %     Weight 06/07/23 1829 255 lb (115.7 kg)     Height 06/07/23 1829 5\' 6"  (1.676 m)     Head Circumference --      Peak Flow --      Pain Score 06/07/23 1829 4     Pain Loc --      Pain Edu? --      Excl. in GC? --     Most recent vital signs: Vitals:   06/07/23 2030 06/07/23 2130  BP: (!) 138/97 (!) 141/76  Pulse: 89 (!) 105  Resp: 13 13  Temp:    SpO2: 100% 100%     General: Awake, no distress.  CV:  Good peripheral perfusion. RRR. No murmurs, rubs, gallops. Resp:  Normal work of breathing. Lungs overall clear, with possible slight expiratory wheeze noted. Abd:  No distention. No tenderness. Other:  Nasal congestion, rhinorrhea, sinus fullness. No facial erythema.    ED Results / Procedures / Treatments   Labs (all labs ordered are listed, but only abnormal results are  displayed) Labs Reviewed  BASIC METABOLIC PANEL - Abnormal; Notable for the following components:      Result Value   Sodium 133 (*)    Potassium 3.1 (*)    Glucose, Bld 189 (*)    Calcium 8.8 (*)    All other components within normal limits  CBC - Abnormal; Notable for the following components:   WBC 12.2 (*)    All other components within normal limits  TROPONIN I (HIGH SENSITIVITY)  TROPONIN I (HIGH SENSITIVITY)     EKG Sinus tachycardia, VR 114. PR 120, QRS 78, QTc 441. No acute ST elevations or depressions. No ischemia or infarct.   RADIOLOGY CXR: Clear   I also independently reviewed and agree with radiologist interpretations.   PROCEDURES:  Critical Care performed: No   MEDICATIONS ORDERED IN ED: Medications  potassium chloride SA (KLOR-CON M) CR tablet 40 mEq (40 mEq Oral Given 06/07/23 2047)  lactated ringers bolus 1,000 mL (0 mLs Intravenous Stopped 06/08/23 0013)  dexamethasone (DECADRON) injection 10 mg (10 mg Intravenous Given 06/07/23 2049)  ipratropium-albuterol (DUONEB) 0.5-2.5 (3) MG/3ML nebulizer  solution 3 mL (3 mLs Nebulization Given 06/07/23 2049)  doxycycline (VIBRAMYCIN) 100 mg in sodium chloride 0.9 % 250 mL IVPB (0 mg Intravenous Stopped 06/08/23 0013)  ketorolac (TORADOL) 30 MG/ML injection 15 mg (15 mg Intravenous Given 06/07/23 2247)     IMPRESSION / MDM / ASSESSMENT AND PLAN / ED COURSE  I reviewed the triage vital signs and the nursing notes.                              Differential diagnosis includes, but is not limited to, bronchitis/CAP with bronchospasm, sinusitis, atypical MSK chest pain, anxiety, GERD/gastritis  Patient's presentation is most consistent with acute presentation with potential threat to life or bodily function.  The patient is on the cardiac monitor to evaluate for evidence of arrhythmia and/or significant heart rate changes  Pleasant 33 yo F here with fatigue, chills, sinus sx, right sided chest pressure and cough.  Suspect sinusitis with possible early CAP/bronchitis. CXR is clear. She is not hypoxic. Trop negative. CBC with minimal leukocytosis, and she does not appear septic. CMP unremarkable. EKG nonischemic. Pt given decadron (h/o poor reaction to prednisone in the past), duoneb, and will d/c on doxy and albuterol with symptomatic control. Her CP is more so with coughing and she has no tachypnea, hypoxia, leg swelling, or signs to suggest PE clinically.    FINAL CLINICAL IMPRESSION(S) / ED DIAGNOSES   Final diagnoses:  Atypical chest pain  Acute cough     Rx / DC Orders   ED Discharge Orders          Ordered    doxycycline (VIBRAMYCIN) 100 MG capsule  2 times daily        06/07/23 2333    albuterol (VENTOLIN HFA) 108 (90 Base) MCG/ACT inhaler  Every 6 hours PRN        06/07/23 2333    ondansetron (ZOFRAN-ODT) 4 MG disintegrating tablet  Every 8 hours PRN        06/07/23 2333    HYDROcodone bit-homatropine (HYCODAN) 5-1.5 MG/5ML syrup  Every 6 hours PRN,   Status:  Discontinued        06/07/23 2333    HYDROcodone bit-homatropine (HYCODAN) 5-1.5 MG/5ML syrup  Every 6 hours PRN        06/07/23 2352             Note:  This document was prepared using Dragon voice recognition software and may include unintentional dictation errors.   Shaune Pollack, MD 06/08/23 [phone]

## 2023-06-07 NOTE — ED Triage Notes (Signed)
Pt to ed from home via POV for sickness. Sinus infection x 1 week ago. Pt has been having cold chills, with some coughing as well which is causing a chest pressure in her chest. Pt is CAOx4, in no acute distress in triage.

## 2023-06-12 DIAGNOSIS — L309 Dermatitis, unspecified: Secondary | ICD-10-CM | POA: Insufficient documentation

## 2023-06-12 DIAGNOSIS — N3946 Mixed incontinence: Secondary | ICD-10-CM | POA: Insufficient documentation

## 2023-06-12 DIAGNOSIS — J45909 Unspecified asthma, uncomplicated: Secondary | ICD-10-CM | POA: Insufficient documentation

## 2023-06-12 DIAGNOSIS — J309 Allergic rhinitis, unspecified: Secondary | ICD-10-CM | POA: Insufficient documentation

## 2023-06-12 DIAGNOSIS — N83201 Unspecified ovarian cyst, right side: Secondary | ICD-10-CM | POA: Insufficient documentation

## 2023-06-12 DIAGNOSIS — F319 Bipolar disorder, unspecified: Secondary | ICD-10-CM | POA: Insufficient documentation

## 2023-06-12 DIAGNOSIS — G43009 Migraine without aura, not intractable, without status migrainosus: Secondary | ICD-10-CM | POA: Insufficient documentation

## 2023-06-17 DIAGNOSIS — N182 Chronic kidney disease, stage 2 (mild): Secondary | ICD-10-CM | POA: Diagnosis not present

## 2023-06-17 DIAGNOSIS — Z09 Encounter for follow-up examination after completed treatment for conditions other than malignant neoplasm: Secondary | ICD-10-CM | POA: Diagnosis not present

## 2023-06-17 DIAGNOSIS — R9389 Abnormal findings on diagnostic imaging of other specified body structures: Secondary | ICD-10-CM | POA: Diagnosis not present

## 2023-06-17 DIAGNOSIS — K582 Mixed irritable bowel syndrome: Secondary | ICD-10-CM | POA: Diagnosis not present

## 2023-06-17 DIAGNOSIS — E119 Type 2 diabetes mellitus without complications: Secondary | ICD-10-CM | POA: Diagnosis not present

## 2023-06-17 DIAGNOSIS — N7011 Chronic salpingitis: Secondary | ICD-10-CM | POA: Diagnosis not present

## 2023-06-17 DIAGNOSIS — J4551 Severe persistent asthma with (acute) exacerbation: Secondary | ICD-10-CM | POA: Diagnosis not present

## 2023-06-17 DIAGNOSIS — K219 Gastro-esophageal reflux disease without esophagitis: Secondary | ICD-10-CM | POA: Diagnosis not present

## 2023-06-17 DIAGNOSIS — H0520 Unspecified exophthalmos: Secondary | ICD-10-CM | POA: Insufficient documentation

## 2023-06-17 DIAGNOSIS — G4733 Obstructive sleep apnea (adult) (pediatric): Secondary | ICD-10-CM | POA: Diagnosis not present

## 2023-06-17 DIAGNOSIS — E1122 Type 2 diabetes mellitus with diabetic chronic kidney disease: Secondary | ICD-10-CM | POA: Diagnosis not present

## 2023-06-20 DIAGNOSIS — H0520 Unspecified exophthalmos: Secondary | ICD-10-CM | POA: Diagnosis not present

## 2023-06-22 ENCOUNTER — Emergency Department: Payer: 59

## 2023-06-22 ENCOUNTER — Other Ambulatory Visit: Payer: Self-pay

## 2023-06-22 ENCOUNTER — Emergency Department
Admission: EM | Admit: 2023-06-22 | Discharge: 2023-06-22 | Disposition: A | Discharge status: Home / Self Care | Payer: 59 | Attending: Emergency Medicine | Admitting: Emergency Medicine

## 2023-06-22 DIAGNOSIS — N83292 Other ovarian cyst, left side: Secondary | ICD-10-CM | POA: Diagnosis not present

## 2023-06-22 DIAGNOSIS — R110 Nausea: Secondary | ICD-10-CM | POA: Diagnosis not present

## 2023-06-22 DIAGNOSIS — R1084 Generalized abdominal pain: Secondary | ICD-10-CM | POA: Insufficient documentation

## 2023-06-22 DIAGNOSIS — R109 Unspecified abdominal pain: Secondary | ICD-10-CM | POA: Diagnosis not present

## 2023-06-22 DIAGNOSIS — K5900 Constipation, unspecified: Secondary | ICD-10-CM | POA: Diagnosis not present

## 2023-06-22 DIAGNOSIS — R102 Pelvic and perineal pain: Secondary | ICD-10-CM | POA: Diagnosis not present

## 2023-06-22 DIAGNOSIS — Z90710 Acquired absence of both cervix and uterus: Secondary | ICD-10-CM | POA: Diagnosis not present

## 2023-06-22 DIAGNOSIS — R9431 Abnormal electrocardiogram [ECG] [EKG]: Secondary | ICD-10-CM | POA: Diagnosis not present

## 2023-06-22 LAB — CBC WITH DIFFERENTIAL/PLATELET
Abs Immature Granulocytes: 0.04 10*3/uL (ref 0.00–0.07)
Basophils Absolute: 0 10*3/uL (ref 0.0–0.1)
Basophils Relative: 0 %
Eosinophils Absolute: 0 10*3/uL (ref 0.0–0.5)
Eosinophils Relative: 0 %
HCT: 38.9 % (ref 36.0–46.0)
Hemoglobin: 12.7 g/dL (ref 12.0–15.0)
Immature Granulocytes: 0 %
Lymphocytes Relative: 17 %
Lymphs Abs: 2.5 10*3/uL (ref 0.7–4.0)
MCH: 27 pg (ref 26.0–34.0)
MCHC: 32.6 g/dL (ref 30.0–36.0)
MCV: 82.6 fL (ref 80.0–100.0)
Monocytes Absolute: 0.7 10*3/uL (ref 0.1–1.0)
Monocytes Relative: 5 %
Neutro Abs: 11.4 10*3/uL — ABNORMAL HIGH (ref 1.7–7.7)
Neutrophils Relative %: 78 %
Platelets: 450 10*3/uL — ABNORMAL HIGH (ref 150–400)
RBC: 4.71 MIL/uL (ref 3.87–5.11)
RDW: 13.5 % (ref 11.5–15.5)
WBC: 14.7 10*3/uL — ABNORMAL HIGH (ref 4.0–10.5)
nRBC: 0 % (ref 0.0–0.2)

## 2023-06-22 LAB — COMPREHENSIVE METABOLIC PANEL
ALT: 31 U/L (ref 0–44)
AST: 29 U/L (ref 15–41)
Albumin: 4 g/dL (ref 3.5–5.0)
Alkaline Phosphatase: 68 U/L (ref 38–126)
Anion gap: 14 (ref 5–15)
BUN: 14 mg/dL (ref 6–20)
CO2: 20 mmol/L — ABNORMAL LOW (ref 22–32)
Calcium: 9.2 mg/dL (ref 8.9–10.3)
Chloride: 102 mmol/L (ref 98–111)
Creatinine, Ser: 0.84 mg/dL (ref 0.44–1.00)
GFR, Estimated: >60 mL/min (ref >60)
Glucose, Bld: 193 mg/dL — ABNORMAL HIGH (ref 70–99)
Potassium: 3.5 mmol/L (ref 3.5–5.1)
Sodium: 136 mmol/L (ref 135–145)
Total Bilirubin: 0.7 mg/dL (ref 0.3–1.2)
Total Protein: 7.8 g/dL (ref 6.5–8.1)

## 2023-06-22 LAB — URINALYSIS, ROUTINE W REFLEX MICROSCOPIC
Bilirubin Urine: NEGATIVE
Glucose, UA: NEGATIVE mg/dL
Hgb urine dipstick: NEGATIVE
Ketones, ur: 80 mg/dL — AB
Leukocytes,Ua: NEGATIVE
Nitrite: NEGATIVE
Protein, ur: NEGATIVE mg/dL
Specific Gravity, Urine: 1.019 (ref 1.005–1.030)
pH: 7 (ref 5.0–8.0)

## 2023-06-22 LAB — POC URINE PREG, ED
Preg Test, Ur: NEGATIVE
Preg Test, Ur: NEGATIVE

## 2023-06-22 LAB — LIPASE, BLOOD: Lipase: 29 U/L (ref 11–51)

## 2023-06-22 LAB — MAGNESIUM: Magnesium: 2.3 mg/dL (ref 1.7–2.4)

## 2023-06-22 MED ORDER — IOHEXOL 350 MG/ML SOLN
100.0000 mL | Freq: Once | INTRAVENOUS | Status: AC | PRN
  Administered 2023-06-22: 100 mL via INTRAVENOUS

## 2023-06-22 MED ORDER — POLYETHYLENE GLYCOL 3350 17 G PO PACK
34.0000 g | PACK | Freq: Once | ORAL | Status: AC
  Administered 2023-06-22: 34 g via ORAL
  Filled 2023-06-22: qty 2

## 2023-06-22 MED ORDER — METOCLOPRAMIDE HCL 5 MG/ML IJ SOLN
10.0000 mg | Freq: Once | INTRAMUSCULAR | Status: AC
  Administered 2023-06-22: 10 mg via INTRAVENOUS
  Filled 2023-06-22: qty 2

## 2023-06-22 MED ORDER — FENTANYL CITRATE PF 50 MCG/ML IJ SOSY
50.0000 ug | PREFILLED_SYRINGE | Freq: Once | INTRAMUSCULAR | Status: AC
  Administered 2023-06-22: 50 ug via INTRAVENOUS
  Filled 2023-06-22: qty 1

## 2023-06-22 MED ORDER — LACTATED RINGERS IV BOLUS
1000.0000 mL | Freq: Once | INTRAVENOUS | Status: AC
  Administered 2023-06-22: 1000 mL via INTRAVENOUS

## 2023-06-22 MED ORDER — POLYETHYLENE GLYCOL 3350 17 G PO PACK: PACK | ORAL | 0 refills | Status: DC

## 2023-06-22 MED ORDER — SORBITOL 70 % SOLN
960.0000 mL | TOPICAL_OIL | Freq: Once | ORAL | Status: AC
  Administered 2023-06-22: 960 mL via RECTAL
  Filled 2023-06-22: qty 240

## 2023-06-22 MED ORDER — DROPERIDOL 2.5 MG/ML IJ SOLN
2.5000 mg | Freq: Once | INTRAMUSCULAR | Status: AC
  Administered 2023-06-22: 2.5 mg via INTRAVENOUS
  Filled 2023-06-22: qty 2

## 2023-06-22 MED ORDER — KETOROLAC TROMETHAMINE 30 MG/ML IJ SOLN
15.0000 mg | Freq: Once | INTRAMUSCULAR | Status: AC
  Administered 2023-06-22: 15 mg via INTRAVENOUS
  Filled 2023-06-22: qty 1

## 2023-06-22 MED ORDER — ONDANSETRON HCL 4 MG/2ML IJ SOLN
4.0000 mg | Freq: Once | INTRAMUSCULAR | Status: AC
  Administered 2023-06-22: 4 mg via INTRAVENOUS
  Filled 2023-06-22: qty 2

## 2023-06-22 MED ORDER — DOCUSATE SODIUM 250 MG PO CAPS: 250.0000 mg | ORAL_CAPSULE | Freq: Every day | ORAL | 0 refills | Status: AC

## 2023-06-22 NOTE — ED Provider Notes (Signed)
Patient here with abdominal pain likely 2/2 constipation. Feels much better after BM in ED. Labs, vitals stable (HR erroneously recorded as high - this was as she was having a BM from enema). Per discussion with outgoing provider and pt, plan for d/c with outpt bowel regimen.   Shaune Pollack, MD 06/22/23 [phone]

## 2023-06-22 NOTE — ED Provider Notes (Signed)
[facility] Provider Note    Event Date/Time   First MD Initiated Contact with Patient 06/22/23 [phone]     (approximate)   History   Abdominal Pain   HPI  Holly Wagner is a 32 y.o. female who presents to the ED for evaluation of Abdominal Pain   Obese patient with history of cholecystectomy, GERD, bipolar lower disorder.  Hysterectomy and bilateral salpingectomy.  Patient presents with diffuse abdominal discomfort and constipation.  Reports dealing with constipation on a subacute or chronic timeframe, but her symptoms seem to suddenly or acutely worsen overnight tonight which is why she presents.  No emesis, no fever.  No dysuria.  Physical Exam   Triage Vital Signs: ED Triage Vitals  Enc Vitals Group     BP      Pulse      Resp      Temp      Temp src      SpO2      Weight      Height      Head Circumference      Peak Flow      Pain Score      Pain Loc      Pain Edu?      Excl. in GC?     Most recent vital signs: Vitals:   06/22/23 0530 06/22/23 0615  BP: (!) 151/92   Pulse: (!) 104 100  Resp:    Temp:    SpO2: 100% 100%    General: Awake, no distress.  Morbidly obese, rolling around in the bed and obviously uncomfortable. CV:  Good peripheral perfusion.  Resp:  Normal effort.  Abd:  No distention.  No peritoneal features MSK:  No deformity noted.  Neuro:  No focal deficits appreciated. Other:     ED Results / Procedures / Treatments   Labs (all labs ordered are listed, but only abnormal results are displayed) Labs Reviewed  COMPREHENSIVE METABOLIC PANEL - Abnormal; Notable for the following components:      Result Value   CO2 20 (*)    Glucose, Bld 193 (*)    All other components within normal limits  CBC WITH DIFFERENTIAL/PLATELET - Abnormal; Notable for the following components:   WBC 14.7 (*)    Platelets 450 (*)    Neutro Abs 11.4 (*)    All other components within normal limits  URINALYSIS, ROUTINE W  REFLEX MICROSCOPIC - Abnormal; Notable for the following components:   Color, Urine YELLOW (*)    APPearance CLEAR (*)    Ketones, ur 80 (*)    All other components within normal limits  MAGNESIUM  LIPASE, BLOOD  POC URINE PREG, ED  POC URINE PREG, ED    EKG Sinus rhythm with a rate of 97 bpm.  Normal axis and intervals.  No clear signs of acute ischemia.  RADIOLOGY CT abdomen/pelvis interpreted by me with constipation but no obstructive features.  Official radiology report(s): US PELVIC COMPLETE W TRANSVAGINAL AND TORSION R/O  Result Date: 06/22/2023 CLINICAL DATA:  Pelvic pain. EXAM: TRANSABDOMINAL AND TRANSVAGINAL ULTRASOUND OF PELVIS DOPPLER ULTRASOUND OF OVARIES TECHNIQUE: Both transabdominal and transvaginal ultrasound examinations of the pelvis were performed. Transabdominal technique was performed for global imaging of the pelvis including uterus, ovaries, adnexal regions, and pelvic cul-de-sac. It was necessary to proceed with endovaginal exam following the transabdominal exam to visualize the adnexal structures and ovaries. Color and duplex Doppler ultrasound was utilized to evaluate blood flow to  the ovaries. COMPARISON:  CT AP 05/23/2023 FINDINGS: Uterus Surgically absent. Endometrium N/A Right ovary Measurements: 4.1 x 1.9 x 2.6 cm = volume: 10.5 mL. Normal appearance/no adnexal mass. Left ovary Measurements: 3.6 x 2.8 x 3.2 cm = volume: 16.8 mL. Anechoic cyst with increased through transmission measures 3 x 1.9 x 1.8 cm. Right hydrosalpinx is again noted as seen on CT from 02/27/2022. Pulsed Doppler evaluation of both ovaries demonstrates normal low-resistance arterial and venous waveforms. Other findings Diminished exam detail secondary to body habitus. IMPRESSION: 1. Status post hysterectomy. 2. No evidence of ovarian torsion. 3. Chronic right hydrosalpinx. 4. 3 cm simple left ovarian cyst.  No follow-up imaging recommended. Electronically Signed   By: Signa Kell M.D.   On:  06/22/2023 05:57   CT ABDOMEN PELVIS W CONTRAST  Result Date: 06/22/2023 CLINICAL DATA:  Abdominal pain and nausea. EXAM: CT ABDOMEN AND PELVIS WITH CONTRAST TECHNIQUE: Multidetector CT imaging of the abdomen and pelvis was performed using the standard protocol following bolus administration of intravenous contrast. RADIATION DOSE REDUCTION: This exam was performed according to the departmental dose-optimization program which includes automated exposure control, adjustment of the mA and/or kV according to patient size and/or use of iterative reconstruction technique. CONTRAST:  OMNIPAQUE IOHEXOL 350 MG/ML SOLN COMPARISON:  February 27, 2022 FINDINGS: Lower chest: No acute abnormality. Hepatobiliary: No focal liver abnormality is seen. Status post cholecystectomy. No biliary dilatation. Pancreas: Unremarkable. No pancreatic ductal dilatation or surrounding inflammatory changes. Spleen: Normal in size without focal abnormality. Adrenals/Urinary Tract: Adrenal glands are unremarkable. Kidneys are normal in size, without renal calculi or focal lesions. Stable bilateral extrarenal pelvis are seen. Bladder is unremarkable. Stomach/Bowel: Stomach is within normal limits. Appendix appears normal. A large amount of stool is seen throughout the colon. No evidence of bowel wall thickening, distention, or inflammatory changes. Vascular/Lymphatic: No significant vascular findings are present. No enlarged abdominal or pelvic lymph nodes. Reproductive: Status post hysterectomy. A 3.3 cm diameter simple cyst is seen within the right adnexa. 3.0 cm and 2.1 cm cystic appearing areas are seen within the anterior aspect of the left adnexa. Other: No abdominal wall hernia or abnormality. No abdominopelvic ascites. Musculoskeletal: No acute or significant osseous findings. IMPRESSION: 1. Large stool burden without evidence of bowel obstruction. 2. Evidence of prior cholecystectomy and prior hysterectomy. 3. Bilateral adnexal cysts  with additional areas that may represent adjacent hydrosalpinx. No follow-up imaging is recommended. This recommendation follows ACR consensus guidelines: White Paper of the ACR Incidental Findings Committee II on Adnexal Findings. J Am Coll Radiol [phone]. Electronically Signed   By: Aram Candela M.D.   On: 06/22/2023 03:32    PROCEDURES and INTERVENTIONS:  Procedures  Medications  sorbitol, milk of mag, mineral oil, glycerin (SMOG) enema (has no administration in time range)  fentaNYL (SUBLIMAZE) injection 50 mcg (50 mcg Intravenous Given 06/22/23 0052)  ondansetron (ZOFRAN) injection 4 mg (4 mg Intravenous Given 06/22/23 0052)  lactated ringers bolus 1,000 mL (0 mLs Intravenous Stopped 06/22/23 0150)  metoCLOPramide (REGLAN) injection 10 mg (10 mg Intravenous Given 06/22/23 0237)  lactated ringers bolus 1,000 mL (0 mLs Intravenous Stopped 06/22/23 0348)  iohexol (OMNIPAQUE) 350 MG/ML injection 100 mL (100 mLs Intravenous Contrast Given 06/22/23 0244)  ketorolac (TORADOL) 30 MG/ML injection 15 mg (15 mg Intravenous Given 06/22/23 0441)  fentaNYL (SUBLIMAZE) injection 50 mcg (50 mcg Intravenous Given 06/22/23 0441)  lactated ringers bolus 1,000 mL (1,000 mLs Intravenous New Bag/Given 06/22/23 0645)  fentaNYL (SUBLIMAZE) injection 50 mcg (50  mcg Intravenous Given 06/22/23 0643)  ondansetron (ZOFRAN) injection 4 mg (4 mg Intravenous Given 06/22/23 0642)  polyethylene glycol (MIRALAX / GLYCOLAX) packet 34 g (34 g Oral Given 06/22/23 0709)     IMPRESSION / MDM / ASSESSMENT AND PLAN / ED COURSE  I reviewed the triage vital signs and the nursing notes.  Differential diagnosis includes, but is not limited to, constipation, SBO, diverticulitis, ovarian torsion  {Patient presents with symptoms of an acute illness or injury that is potentially life-threatening.  Patient presents with acute on chronic abdominal pain, likely due to constipation.  She is obviously uncomfortable.  No peritoneal abdominal  features.  Blood work with leukocytosis but I doubt infectious etiology of her symptoms.  Metabolic and hyperglycemia without acidosis.  Normal electrolytes and lipase.  Urine with ketones suggestive of dehydration but no infectious features.  CT without obstructive features, pelvic ultrasound without evidence of torsion.  Disimpaction attempted but no palpable stool that I can help with disimpaction.  Will provide medications and reassess.  Should be suitable for outpatient management.  Clinical Course as of 06/22/23 2952  Wynelle Link Jun 22, 2023  0126 Went to reassess, on the toilet and she requested come back later [DS]  0427 Reassessed and got some additional history now that she is more comfortable.  Tells me of the sudden worsening of her pain and we discussed the possibility of ovarian torsion my recommendation for ultrasound.  She is agreeable. [DS]  [phone] Chaperoned rectal and attempted disimpaction, nothing I can reach. We discuss plan of care. She is so uncomfortable she would like to stay here for meds to pass BM [DS]    Clinical Course User Index [DS] Delton Prairie, MD     FINAL CLINICAL IMPRESSION(S) / ED DIAGNOSES   Final diagnoses:  Constipation, unspecified constipation type  Generalized abdominal pain     Rx / DC Orders   ED Discharge Orders     None        Note:  This document was prepared using Dragon voice recognition software and may include unintentional dictation errors.   Delton Prairie, MD 06/22/23 [phone]

## 2023-06-22 NOTE — ED Triage Notes (Signed)
Pt to ED via [facility] c/o abd pain and nausea for 6 hrs. Pt reporting upper quadrant pain that doesn't radiate anywhere. Pt vomited 1x en route. Denies any diarrhea Denies CP, dizziness.

## 2023-07-21 DIAGNOSIS — R935 Abnormal findings on diagnostic imaging of other abdominal regions, including retroperitoneum: Secondary | ICD-10-CM | POA: Diagnosis not present

## 2023-07-21 DIAGNOSIS — H0520 Unspecified exophthalmos: Secondary | ICD-10-CM | POA: Diagnosis not present

## 2023-07-21 DIAGNOSIS — H4931 Total (external) ophthalmoplegia, right eye: Secondary | ICD-10-CM | POA: Diagnosis not present

## 2023-07-21 DIAGNOSIS — R9389 Abnormal findings on diagnostic imaging of other specified body structures: Secondary | ICD-10-CM | POA: Diagnosis not present

## 2023-07-21 DIAGNOSIS — N7011 Chronic salpingitis: Secondary | ICD-10-CM | POA: Diagnosis not present

## 2023-07-24 DIAGNOSIS — N7011 Chronic salpingitis: Secondary | ICD-10-CM | POA: Insufficient documentation

## 2023-08-25 DIAGNOSIS — F3112 Bipolar disorder, current episode manic without psychotic features, moderate: Secondary | ICD-10-CM | POA: Diagnosis not present

## 2023-08-25 DIAGNOSIS — J4520 Mild intermittent asthma, uncomplicated: Secondary | ICD-10-CM | POA: Diagnosis not present

## 2023-08-25 DIAGNOSIS — S93401A Sprain of unspecified ligament of right ankle, initial encounter: Secondary | ICD-10-CM | POA: Diagnosis not present

## 2023-08-25 DIAGNOSIS — R399 Unspecified symptoms and signs involving the genitourinary system: Secondary | ICD-10-CM | POA: Diagnosis not present

## 2023-08-25 DIAGNOSIS — I129 Hypertensive chronic kidney disease with stage 1 through stage 4 chronic kidney disease, or unspecified chronic kidney disease: Secondary | ICD-10-CM | POA: Diagnosis not present

## 2023-08-25 DIAGNOSIS — Z7984 Long term (current) use of oral hypoglycemic drugs: Secondary | ICD-10-CM | POA: Diagnosis not present

## 2023-08-25 DIAGNOSIS — H539 Unspecified visual disturbance: Secondary | ICD-10-CM | POA: Diagnosis not present

## 2023-08-25 DIAGNOSIS — N182 Chronic kidney disease, stage 2 (mild): Secondary | ICD-10-CM | POA: Diagnosis not present

## 2023-08-25 DIAGNOSIS — E1122 Type 2 diabetes mellitus with diabetic chronic kidney disease: Secondary | ICD-10-CM | POA: Diagnosis not present

## 2023-09-30 DIAGNOSIS — N3946 Mixed incontinence: Secondary | ICD-10-CM | POA: Diagnosis not present

## 2023-09-30 DIAGNOSIS — N7011 Chronic salpingitis: Secondary | ICD-10-CM | POA: Diagnosis not present

## 2023-10-06 ENCOUNTER — Emergency Department (HOSPITAL_BASED_OUTPATIENT_CLINIC_OR_DEPARTMENT_OTHER)
Admission: EM | Admit: 2023-10-06 | Discharge: 2023-10-06 | Disposition: A | Discharge status: Home / Self Care | Payer: 59 | Attending: Emergency Medicine | Admitting: Emergency Medicine

## 2023-10-06 ENCOUNTER — Encounter (HOSPITAL_BASED_OUTPATIENT_CLINIC_OR_DEPARTMENT_OTHER): Payer: Self-pay | Admitting: Emergency Medicine

## 2023-10-06 ENCOUNTER — Other Ambulatory Visit: Payer: Self-pay

## 2023-10-06 ENCOUNTER — Emergency Department (HOSPITAL_BASED_OUTPATIENT_CLINIC_OR_DEPARTMENT_OTHER): Payer: 59

## 2023-10-06 DIAGNOSIS — R251 Tremor, unspecified: Secondary | ICD-10-CM | POA: Diagnosis not present

## 2023-10-06 DIAGNOSIS — F445 Conversion disorder with seizures or convulsions: Secondary | ICD-10-CM | POA: Diagnosis not present

## 2023-10-06 DIAGNOSIS — R569 Unspecified convulsions: Secondary | ICD-10-CM

## 2023-10-06 DIAGNOSIS — H538 Other visual disturbances: Secondary | ICD-10-CM | POA: Insufficient documentation

## 2023-10-06 DIAGNOSIS — R519 Headache, unspecified: Secondary | ICD-10-CM | POA: Diagnosis not present

## 2023-10-06 LAB — COMPREHENSIVE METABOLIC PANEL
ALT: 33 U/L (ref 0–44)
AST: 32 U/L (ref 15–41)
Albumin: 3.8 g/dL (ref 3.5–5.0)
Alkaline Phosphatase: 62 U/L (ref 38–126)
Anion gap: 14 (ref 5–15)
BUN: 15 mg/dL (ref 6–20)
CO2: 25 mmol/L (ref 22–32)
Calcium: 8.9 mg/dL (ref 8.9–10.3)
Chloride: 102 mmol/L (ref 98–111)
Creatinine, Ser: 0.91 mg/dL (ref 0.44–1.00)
GFR, Estimated: >60 mL/min (ref >60)
Glucose, Bld: 127 mg/dL — ABNORMAL HIGH (ref 70–99)
Potassium: 3.7 mmol/L (ref 3.5–5.1)
Sodium: 141 mmol/L (ref 135–145)
Total Bilirubin: 0.2 mg/dL — ABNORMAL LOW (ref 0.3–1.2)
Total Protein: 6.8 g/dL (ref 6.5–8.1)

## 2023-10-06 LAB — CBC WITH DIFFERENTIAL/PLATELET
Abs Immature Granulocytes: 0.02 10*3/uL (ref 0.00–0.07)
Basophils Absolute: 0 10*3/uL (ref 0.0–0.1)
Basophils Relative: 0 %
Eosinophils Absolute: 0 10*3/uL (ref 0.0–0.5)
Eosinophils Relative: 0 %
HCT: 36 % (ref 36.0–46.0)
Hemoglobin: 11.5 g/dL — ABNORMAL LOW (ref 12.0–15.0)
Immature Granulocytes: 0 %
Lymphocytes Relative: 36 %
Lymphs Abs: 3.4 10*3/uL (ref 0.7–4.0)
MCH: 27.1 pg (ref 26.0–34.0)
MCHC: 31.9 g/dL (ref 30.0–36.0)
MCV: 84.9 fL (ref 80.0–100.0)
Monocytes Absolute: 0.7 10*3/uL (ref 0.1–1.0)
Monocytes Relative: 7 %
Neutro Abs: 5.3 10*3/uL (ref 1.7–7.7)
Neutrophils Relative %: 57 %
Platelets: 359 10*3/uL (ref 150–400)
RBC: 4.24 MIL/uL (ref 3.87–5.11)
RDW: 13.3 % (ref 11.5–15.5)
WBC: 9.5 10*3/uL (ref 4.0–10.5)
nRBC: 0 % (ref 0.0–0.2)

## 2023-10-06 LAB — MAGNESIUM: Magnesium: 1.8 mg/dL (ref 1.7–2.4)

## 2023-10-06 LAB — ETHANOL: Alcohol, Ethyl (B): <10 mg/dL (ref <10)

## 2023-10-06 LAB — LACTIC ACID, PLASMA: Lactic Acid, Venous: 0.9 mmol/L (ref 0.5–1.9)

## 2023-10-06 LAB — CBG MONITORING, ED: Glucose-Capillary: 121 mg/dL — ABNORMAL HIGH (ref 70–99)

## 2023-10-06 MED ORDER — ACETAMINOPHEN 325 MG PO TABS
650.0000 mg | ORAL_TABLET | Freq: Once | ORAL | Status: AC
  Administered 2023-10-06: 650 mg via ORAL
  Filled 2023-10-06: qty 2

## 2023-10-06 MED ORDER — LORAZEPAM 1 MG PO TABS
1.0000 mg | ORAL_TABLET | Freq: Once | ORAL | Status: AC
  Administered 2023-10-06: 1 mg via ORAL
  Filled 2023-10-06: qty 1

## 2023-10-06 NOTE — ED Notes (Addendum)
Pt became less responsive during triage with BIL eye fluttering. Pt responded to name with mumbles and opened eyes to sternal rub. EDP to bedside.

## 2023-10-06 NOTE — Discharge Instructions (Addendum)
Your workup in the Emergency Department was overall reassuring.  Suspect likely nonepileptic seizure.  Please follow-up with your primary care physician and consider outpatient referral to a neurologist for further outpatient workup.  Observe driving precautions until cleared outpatient.  Department of Motor Vehicle [facility]) of [place] regulations for seizures - It is the patient's responsibility to report the incidence of the seizure in the state of [place]. [place] has no statutory provision requiring physicians to report patients diagnosed with epilepsy or seizures to a central state agency.  The recommended DMV regulation requirement for a driver in [place] for an individual with a seizure is that they be seizure-free for 6-12 months. However, the DMV may consider the following exceptions to this general rule where: (1) a physician-directed change in medication causes a seizure and the individual immediately resumes the previous therapy which controlled seizures; (2) there is a history of nocturnal seizures or seizures which do not involve loss of consciousness, loss of control of motor function, or loss of appropriate sensation and information process; and (3) an individual has a seizure disorder preceded by an aura (warning) lasting 2-3 minutes. While the [facility] may also give consideration to other unusual circumstances which may affect the general requirement that drivers be seizure-free for 6-12 months, interpretation of these circumstances and assignment of restrictions is at the discretion of the Medical Advisor. The DMV also considers compliance with medical therapy essential for safe driving. [facility] [place] Physician's Guide to [employer] (June, 1995 ed.)] The Department learns of an individual's condition by inquiring on the application form or renewal form, a physician's report to the [facility], an accident report or from correspondence from the individual. The person may be required to submit a  Medical Report Form either annually or semi-annually.

## 2023-10-06 NOTE — ED Provider Notes (Signed)
[facility] Provider Note   CSN: [id number] Arrival date & time: 10/06/23  0131     History  Chief Complaint  Patient presents with   Seizures    Holly Wagner is a 32 y.o. female.   Seizures    32 year old female with medical history significant for bipolar disorder, GERD, fibromyalgia, anxiety, depression, previous admission to [facility] in 2015 for weakness, stroke workup negative thought to be psychogenic in origin presenting to the emergency department with report of seizure-like activity.  The patient states that she has had reported seizure-like activity for the past year.  She is having an episode roughly every 3 months.  She describes generalized shaking.  No clear loss of consciousness.  She has had an increasing frequency with 5 episodes of seizure-like activity over the past 24 hours.  She does not yet have a diagnosis for this.  She had endorsed some blurry vision, mild headache, generalized shaking and decreased responsiveness.  She had an episode in the emergency department that was witnessed by myself as well as nursing staff bedside.  She maintained consciousness throughout. Currently denies any headaches. Feels mildly anxious.  Home Medications Prior to Admission medications   Medication Sig Start Date End Date Taking? Authorizing Provider  albuterol (VENTOLIN HFA) 108 (90 Base) MCG/ACT inhaler Inhale 2 puffs into the lungs every 6 (six) hours as needed for wheezing or shortness of breath. 06/07/23   Shaune Pollack, MD  amoxicillin (AMOXIL) 875 MG tablet Take 1 tablet (875 mg total) by mouth 2 (two) times daily. 03/24/23   Fisher, Roselyn Bering, PA-C  benzonatate (TESSALON PERLES) 100 MG capsule Take 1 capsule (100 mg total) by mouth 3 (three) times daily as needed for cough. 03/24/23 03/23/24  Fisher, Roselyn Bering, PA-C  colestipol (COLESTID) 1 g tablet Take 2 g by mouth at bedtime. 01/20/21   [provider]  dicyclomine (BENTYL)  20 MG tablet Take 1 tablet (20 mg total) by mouth 2 (two) times daily. 02/27/22   Marita Kansas, PA-C  DULoxetine (CYMBALTA) 20 MG capsule Take 20 mg by mouth at bedtime.    [provider]  fluticasone (FLONASE) 50 MCG/ACT nasal spray Place 1 spray into both nostrils daily as needed for allergies. 01/24/21   [provider]  HYDROcodone bit-homatropine (HYCODAN) 5-1.5 MG/5ML syrup Take 5 mLs by mouth every 6 (six) hours as needed for cough. 06/07/23   Shaune Pollack, MD  omeprazole (PRILOSEC) 40 MG capsule Take 40 mg by mouth at bedtime.    [provider]  ondansetron (ZOFRAN-ODT) 4 MG disintegrating tablet Take 1 tablet (4 mg total) by mouth every 8 (eight) hours as needed for nausea or vomiting. 06/07/23   Shaune Pollack, MD  polyethylene glycol (MIRALAX) 17 g packet Take one cap full/package twice daily until BMs are soft and regular, then 1-2 x daily as needed for constipation. 06/22/23   Shaune Pollack, MD  prochlorperazine (COMPAZINE) 5 MG tablet Take 5 mg by mouth every 6 (six) hours as needed for nausea/vomiting. 02/21/21   [provider]  traZODone (DESYREL) 50 MG tablet Take 50 mg by mouth at bedtime.    [provider]      Allergies    Morphine and codeine, Prednisone, and Tizanidine    Review of Systems   Review of Systems  All other systems reviewed and are negative.   Physical Exam Updated Vital Signs BP (!) 145/88   Pulse 98   Temp  98.4 F (36.9 C) (Oral)   Resp (!) 5 Comment: EDP aware, pt asleep, oxygen saturation WNL  Ht 5\' 9"  (1.753 m)   LMP 04/12/2021 (Exact Date)   SpO2 97%   BMI 36.92 kg/m  Physical Exam Vitals and nursing note reviewed.  Constitutional:      General: She is not in acute distress.    Appearance: She is well-developed.  HENT:     Head: Normocephalic and atraumatic.  Eyes:     Conjunctiva/sclera: Conjunctivae normal.  Cardiovascular:     Rate and Rhythm: Normal rate and regular rhythm.  Pulmonary:      Effort: Pulmonary effort is normal. No respiratory distress.     Breath sounds: Normal breath sounds.  Abdominal:     Palpations: Abdomen is soft.     Tenderness: There is no abdominal tenderness.  Musculoskeletal:        General: No swelling.     Cervical back: Neck supple.  Skin:    General: Skin is warm and dry.     Capillary Refill: Capillary refill takes less than 2 seconds.  Neurological:     Mental Status: She is alert.     Comments: MENTAL STATUS EXAM:    Orientation: Alert and oriented to person, place and time.  Memory: Cooperative, follows commands well.  Language: Speech is clear and language is normal.   CRANIAL NERVES:    CN 2 (Optic): Visual fields intact to confrontation.  CN 3,4,6 (EOM): Pupils equal and reactive to light. Full extraocular eye movement without nystagmus.  CN 5 (Trigeminal): Facial sensation is subjectively diminished on the left, no weakness of masticatory muscles.  CN 7 (Facial): No facial weakness or asymmetry.  CN 8 (Auditory): Auditory acuity grossly normal.  CN 9,10 (Glossophar): The uvula is midline, the palate elevates symmetrically.  CN 11 (spinal access): Normal sternocleidomastoid and trapezius strength.  CN 12 (Hypoglossal): The tongue is midline. No atrophy or fasciculations.Marland Kitchen   MOTOR:  Muscle Strength: 5/5RUE, 5/5LUE, 5/5RLE, 5/5LLE.   COORDINATION:   Intact finger-to-nose, no tremor.   SENSATION:   Intact to light touch all four extremities.     Psychiatric:        Mood and Affect: Mood normal.     ED Results / Procedures / Treatments   Labs (all labs ordered are listed, but only abnormal results are displayed) Labs Reviewed  COMPREHENSIVE METABOLIC PANEL - Abnormal; Notable for the following components:      Result Value   Glucose, Bld 127 (*)    Total Bilirubin 0.2 (*)    All other components within normal limits  CBC WITH DIFFERENTIAL/PLATELET - Abnormal; Notable for the following components:   Hemoglobin 11.5  (*)    All other components within normal limits  CBG MONITORING, ED - Abnormal; Notable for the following components:   Glucose-Capillary 121 (*)    All other components within normal limits  MAGNESIUM  ETHANOL  LACTIC ACID, PLASMA  URINALYSIS, ROUTINE W REFLEX MICROSCOPIC  RAPID URINE DRUG SCREEN, HOSP PERFORMED    EKG None  Radiology CT HEAD WO CONTRAST  Result Date: 10/06/2023 CLINICAL DATA:  Multiple seizures EXAM: CT HEAD WITHOUT CONTRAST TECHNIQUE: Contiguous axial images were obtained from the base of the skull through the vertex without intravenous contrast. RADIATION DOSE REDUCTION: This exam was performed according to the departmental dose-optimization program which includes automated exposure control, adjustment of the mA and/or kV according to patient size and/or use of iterative reconstruction technique. COMPARISON:  07/21/2023  CT head FINDINGS: Brain: No evidence of acute infarction, hemorrhage, mass, mass effect, or midline shift. No hydrocephalus or extra-axial fluid collection. Vascular: No hyperdense vessel. Skull: Negative for fracture or focal lesion. Sinuses/Orbits: No acute finding. Redemonstrated proptosis with otherwise normal appearance of the orbits. Other: The mastoid air cells are well aerated. IMPRESSION: No acute intracranial process. Electronically Signed   By: Wiliam Ke M.D.   On: 10/06/2023 03:06    Procedures Procedures    Medications Ordered in ED Medications  LORazepam (ATIVAN) tablet 1 mg (1 mg Oral Given 10/06/23 0211)    ED Course/ Medical Decision Making/ A&P                                 Medical Decision Making Amount and/or Complexity of Data Reviewed Labs: ordered. Radiology: ordered.  Risk Prescription drug management.     32 year old female with medical history significant for bipolar disorder, GERD, fibromyalgia, anxiety, depression, previous admission to [facility] in 2015 for weakness, stroke workup negative thought to be  psychogenic in origin presenting to the emergency department with report of seizure-like activity.  The patient states that she has had reported seizure-like activity for the past year.  She is having an episode roughly every 3 months.  She describes generalized shaking.  No clear loss of consciousness.  She has had an increasing frequency with 5 episodes of seizure-like activity over the past 24 hours.  She does not yet have a diagnosis for this.  She had endorsed some blurry vision, mild headache, generalized shaking and decreased responsiveness.  She had an episode in the emergency department that was witnessed by myself as well as nursing staff bedside.  She maintained consciousness throughout. Currently denies any headaches. Feels mildly anxious.  On arrival, the patient was afebrile, not tachycardic or tachypneic, initially hypertensive 166/101, improved to 145/88, saturating well on room air.  CT Head:  IMPRESSION:  No acute intracranial process.   Labs: Lactic acid normal, magnesium normal, CBG 121, ethanol level negative, CBC without a leukocytosis, only mild anemia to 11.5, CMP without electrolyte abnormality.  Patient unable to provide a urine sample.  Administered Ativan 1 mg for anxiousness.  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  Recommended the patient follow-up outpatient with her primary care provider, consider outpatient referral for follow-up and evaluation with a neurologist.    Final Clinical Impression(s) / ED Diagnoses Final diagnoses:  Seizure-like activity (HCC)  Psychogenic nonepileptic seizure    Rx / DC Orders ED Discharge Orders     None         Ernie Avena, MD 10/06/23 [phone]

## 2023-10-06 NOTE — ED Triage Notes (Addendum)
Pt and visitor report 5 seizures between Sunday am at [facility] and Sunday evening. Last one was at 1700 Sunday night. Pt states she does not yet have a diagnosis for the cause of her seizures. ~1 year history. Pt is slow with her speech, but able to answer questions adequately and accurately. She reports increased fatigue. Seizure pads placed on stretcher.

## 2023-10-08 DIAGNOSIS — R251 Tremor, unspecified: Secondary | ICD-10-CM | POA: Diagnosis not present

## 2023-10-08 DIAGNOSIS — I129 Hypertensive chronic kidney disease with stage 1 through stage 4 chronic kidney disease, or unspecified chronic kidney disease: Secondary | ICD-10-CM | POA: Diagnosis not present

## 2023-10-08 DIAGNOSIS — K5909 Other constipation: Secondary | ICD-10-CM | POA: Diagnosis not present

## 2023-10-08 DIAGNOSIS — E1122 Type 2 diabetes mellitus with diabetic chronic kidney disease: Secondary | ICD-10-CM | POA: Diagnosis not present

## 2023-10-08 DIAGNOSIS — R42 Dizziness and giddiness: Secondary | ICD-10-CM | POA: Diagnosis not present

## 2023-10-08 DIAGNOSIS — I10 Essential (primary) hypertension: Secondary | ICD-10-CM | POA: Insufficient documentation

## 2023-10-08 DIAGNOSIS — H0520 Unspecified exophthalmos: Secondary | ICD-10-CM | POA: Diagnosis not present

## 2023-10-08 DIAGNOSIS — G4733 Obstructive sleep apnea (adult) (pediatric): Secondary | ICD-10-CM | POA: Diagnosis not present

## 2023-10-08 DIAGNOSIS — N182 Chronic kidney disease, stage 2 (mild): Secondary | ICD-10-CM | POA: Diagnosis not present

## 2023-10-08 DIAGNOSIS — R569 Unspecified convulsions: Secondary | ICD-10-CM | POA: Diagnosis not present

## 2023-10-08 DIAGNOSIS — G43009 Migraine without aura, not intractable, without status migrainosus: Secondary | ICD-10-CM | POA: Diagnosis not present

## 2023-10-08 DIAGNOSIS — N7011 Chronic salpingitis: Secondary | ICD-10-CM | POA: Diagnosis not present

## 2023-10-08 DIAGNOSIS — Z794 Long term (current) use of insulin: Secondary | ICD-10-CM | POA: Diagnosis not present

## 2023-10-15 ENCOUNTER — Encounter (HOSPITAL_BASED_OUTPATIENT_CLINIC_OR_DEPARTMENT_OTHER): Payer: Self-pay | Admitting: Emergency Medicine

## 2023-10-15 ENCOUNTER — Emergency Department (HOSPITAL_BASED_OUTPATIENT_CLINIC_OR_DEPARTMENT_OTHER)
Admission: EM | Admit: 2023-10-15 | Discharge: 2023-10-15 | Disposition: A | Discharge status: Home / Self Care | Payer: 59 | Attending: Emergency Medicine | Admitting: Emergency Medicine

## 2023-10-15 ENCOUNTER — Other Ambulatory Visit: Payer: Self-pay

## 2023-10-15 ENCOUNTER — Emergency Department (HOSPITAL_BASED_OUTPATIENT_CLINIC_OR_DEPARTMENT_OTHER): Payer: 59

## 2023-10-15 DIAGNOSIS — R5381 Other malaise: Secondary | ICD-10-CM | POA: Diagnosis not present

## 2023-10-15 DIAGNOSIS — R42 Dizziness and giddiness: Secondary | ICD-10-CM | POA: Insufficient documentation

## 2023-10-15 DIAGNOSIS — R0602 Shortness of breath: Secondary | ICD-10-CM | POA: Diagnosis not present

## 2023-10-15 DIAGNOSIS — Z1152 Encounter for screening for COVID-19: Secondary | ICD-10-CM | POA: Insufficient documentation

## 2023-10-15 DIAGNOSIS — M791 Myalgia, unspecified site: Secondary | ICD-10-CM | POA: Diagnosis not present

## 2023-10-15 DIAGNOSIS — R52 Pain, unspecified: Secondary | ICD-10-CM

## 2023-10-15 LAB — CBC
HCT: 35.2 % — ABNORMAL LOW (ref 36.0–46.0)
Hemoglobin: 11.8 g/dL — ABNORMAL LOW (ref 12.0–15.0)
MCH: 27.2 pg (ref 26.0–34.0)
MCHC: 33.5 g/dL (ref 30.0–36.0)
MCV: 81.1 fL (ref 80.0–100.0)
Platelets: 310 10*3/uL (ref 150–400)
RBC: 4.34 MIL/uL (ref 3.87–5.11)
RDW: 13.4 % (ref 11.5–15.5)
WBC: 9.7 10*3/uL (ref 4.0–10.5)
nRBC: 0 % (ref 0.0–0.2)

## 2023-10-15 LAB — TROPONIN I (HIGH SENSITIVITY): Troponin I (High Sensitivity): 3 ng/L (ref <18)

## 2023-10-15 LAB — BASIC METABOLIC PANEL
Anion gap: 13 (ref 5–15)
BUN: 15 mg/dL (ref 6–20)
CO2: 22 mmol/L (ref 22–32)
Calcium: 9.2 mg/dL (ref 8.9–10.3)
Chloride: 100 mmol/L (ref 98–111)
Creatinine, Ser: 0.93 mg/dL (ref 0.44–1.00)
GFR, Estimated: >60 mL/min (ref >60)
Glucose, Bld: 158 mg/dL — ABNORMAL HIGH (ref 70–99)
Potassium: 3.4 mmol/L — ABNORMAL LOW (ref 3.5–5.1)
Sodium: 135 mmol/L (ref 135–145)

## 2023-10-15 LAB — RESP PANEL BY RT-PCR (RSV, FLU A&B, COVID)  RVPGX2
Influenza A by PCR: NEGATIVE
Influenza B by PCR: NEGATIVE
Resp Syncytial Virus by PCR: NEGATIVE
SARS Coronavirus 2 by RT PCR: NEGATIVE

## 2023-10-15 LAB — D-DIMER, QUANTITATIVE: D-Dimer, Quant: 0.45 ug{FEU}/mL (ref 0.00–0.50)

## 2023-10-15 MED ORDER — KETOROLAC TROMETHAMINE 15 MG/ML IJ SOLN
15.0000 mg | Freq: Once | INTRAMUSCULAR | Status: AC
  Administered 2023-10-15: 15 mg via INTRAVENOUS
  Filled 2023-10-15: qty 1

## 2023-10-15 MED ORDER — SODIUM CHLORIDE 0.9 % IV BOLUS
1000.0000 mL | Freq: Once | INTRAVENOUS | Status: AC
  Administered 2023-10-15: 1000 mL via INTRAVENOUS

## 2023-10-15 MED ORDER — METHOCARBAMOL 500 MG PO TABS
1000.0000 mg | ORAL_TABLET | Freq: Once | ORAL | Status: AC
  Administered 2023-10-15: 1000 mg via ORAL
  Filled 2023-10-15: qty 2

## 2023-10-15 MED ORDER — ACETAMINOPHEN 500 MG PO TABS
1000.0000 mg | ORAL_TABLET | Freq: Once | ORAL | Status: AC
  Administered 2023-10-15: 1000 mg via ORAL
  Filled 2023-10-15: qty 2

## 2023-10-15 NOTE — ED Triage Notes (Signed)
Pt c/o shob with dizziness, emesis and fatigue since 1600 yesterday.

## 2023-10-15 NOTE — Discharge Instructions (Signed)
You were evaluated in the Emergency Department and after careful evaluation, we did not find any emergent condition requiring admission or further testing in the hospital.  Your exam/testing today is overall reassuring.  X-ray did not show any signs of pneumonia.  Suspect your symptoms are related to a viral illness.  Recommend Tylenol or Motrin for discomfort, plenty of fluids and rest at home.  Please return to the Emergency Department if you experience any worsening of your condition.   Thank you for allowing [place] to be a part of your care.

## 2023-10-15 NOTE — ED Provider Notes (Signed)
[facility] Emergency Department Provider Note MRN:  [id number]  Arrival date & time: 10/15/23     Chief Complaint   Shortness of Breath   History of Present Illness   Holly Wagner is a 32 y.o. year-old female with a history of bipolar disorder presenting to the ED with chief complaint of shortness of breath.  Intermittent right leg/thigh pain for the past few days.  Sudden onset malaise, fatigue, dizziness this evening that progressed to significant shortness of breath with any exertion, feeling like she is going to pass out, diffuse bodyaches.  Review of Systems  A thorough review of systems was obtained and all systems are negative except as noted in the HPI and PMH.   Patient's Health History    Past Medical History:  Diagnosis Date   ADD (attention deficit disorder)    Anxiety and depression    Asthma    Bipolar 1 disorder (HCC)    Dumping syndrome    GERD (gastroesophageal reflux disease)    Mental disorder    PONV (postoperative nausea and vomiting)    Pre-diabetes     Past Surgical History:  Procedure Laterality Date   BILATERAL SALPINGECTOMY Bilateral 04/13/2021   Procedure: BILATERAL SALPINGECTOMY;  Surgeon: Schermerhorn, Ihor Austin, MD;  Location: [facility] ORS;  Service: Gynecology;  Laterality: Bilateral;   CHOLECYSTECTOMY  2007   fatty tumor removal  1992   IUD REMOVAL N/A 04/13/2021   Procedure: INTRAUTERINE DEVICE (IUD) REMOVAL;  Surgeon: Schermerhorn, Ihor Austin, MD;  Location: [facility] ORS;  Service: Gynecology;  Laterality: N/A;   SINUS SURGERY WITH INSTATRAK     unsure, maybe 4 years ago   VAGINAL HYSTERECTOMY N/A 04/13/2021   Procedure: HYSTERECTOMY VAGINAL;  Surgeon: Schermerhorn, Ihor Austin, MD;  Location: [facility] ORS;  Service: Gynecology;  Laterality: N/A;   WISDOM TOOTH EXTRACTION      Family History  Problem Relation Age of Onset   Cancer Other     Social History   Socioeconomic History   Marital status: Married    Spouse name:  Not on file   Number of children: Not on file   Years of education: Not on file   Highest education level: Not on file  Occupational History   Not on file  Tobacco Use   Smoking status: Former    Current packs/day: 0.00    Types: Cigarettes    Quit date: 04/30/2015    Years since quitting: 8.4   Smokeless tobacco: Never  Vaping Use   Vaping status: Every Day   Substances: Nicotine, Flavoring  Substance and Sexual Activity   Alcohol use: No   Drug use: Not Currently    Types: Marijuana    Comment: had allergic reaction to MJ hospitalized x 1 week   Sexual activity: Yes  Other Topics Concern   Not on file  Social History Narrative   Not on file   Social Determinants of Health   Financial Resource Strain: High Risk (10/23/2022)   Received from [facility]   Overall Financial Resource Strain (CARDIA)    Difficulty of Paying Living Expenses: Very hard  Food Insecurity: Low Risk  (08/25/2023)   Received from [facility]   Hunger Vital Sign    Worried About Running Out of Food in the Last Year: Never true    Ran Out of Food in the Last Year: Never true  Recent Concern: Food Insecurity - High Risk (06/16/2023)   Received from  [facility]   Food vital sign    Within the past 12 months, you worried that your food would run out before you got money to buy more: Often true    Within the past 12 months, the food you bought just didn't last and you didn't have money to get more. : Often true  Transportation Needs: No Transportation Needs (08/25/2023)   Received from [facility]    In the past 12 months, has lack of reliable transportation kept you from medical appointments, meetings, work or from getting things needed for daily living? : No  Recent Concern: Transportation Needs - Unmet Transportation Needs (06/16/2023)   Received from [facility]    In the past 12 months, has lack of reliable  transportation kept you from medical appointments, meetings, work or from getting things needed for daily living? : Yes  Physical Activity: Not on file  Stress: Not on file  Social Connections: Not on file  Intimate Partner Violence: Not on file     Physical Exam   Vitals:   10/15/23 0400 10/15/23 0415  BP:  126/68  Pulse: (!) 110 (!) 116  Resp: 13 14  Temp:    SpO2: 100% 99%    CONSTITUTIONAL: Well-appearing, NAD NEURO/PSYCH:  Alert and oriented x 3, no focal deficits EYES:  eyes equal and reactive ENT/NECK:  no LAD, no JVD CARDIO: Tachycardic rate, well-perfused, normal S1 and S2 PULM:  CTAB no wheezing or rhonchi GI/GU:  non-distended, non-tender MSK/SPINE:  No gross deformities, no edema SKIN:  no rash, atraumatic   *Additional and/or pertinent findings included in MDM below  Diagnostic and Interventional Summary    EKG Interpretation Date/Time:  Wednesday October 15 2023 02:52:12 EDT Ventricular Rate:  118 PR Interval:  120 QRS Duration:  87 QT Interval:  324 QTC Calculation: 454 R Axis:   28  Text Interpretation: Sinus tachycardia Confirmed by Kennis Carina [phone]) on 10/15/2023 4:05:04 AM       Labs Reviewed  CBC - Abnormal; Notable for the following components:      Result Value   Hemoglobin 11.8 (*)    HCT 35.2 (*)    All other components within normal limits  BASIC METABOLIC PANEL - Abnormal; Notable for the following components:   Potassium 3.4 (*)    Glucose, Bld 158 (*)    All other components within normal limits  RESP PANEL BY RT-PCR (RSV, FLU A&B, COVID)  [id number]  D-DIMER, QUANTITATIVE  TROPONIN I (HIGH SENSITIVITY)    DG Chest Port 1 View  Final Result      Medications  sodium chloride 0.9 % bolus 1,000 mL (0 mLs Intravenous Stopped 10/15/23 0435)  ketorolac (TORADOL) 15 MG/ML injection 15 mg (15 mg Intravenous Given 10/15/23 0335)  acetaminophen (TYLENOL) tablet 1,000 mg (1,000 mg Oral Given 10/15/23 0504)  methocarbamol (ROBAXIN)  tablet 1,000 mg (1,000 mg Oral Given 10/15/23 0505)     Procedures  /  Critical Care Procedures  ED Course and Medical Decision Making  Initial Impression and Ddx Differential diagnosis includes influenza, COVID, pneumonia, PE  Past medical/surgical history that increases complexity of ED encounter: None  Interpretation of Diagnostics I personally reviewed the EKG and my interpretation is as follows: Sinus tachycardia  Labs reassuring with no significant blood count or electrolyte disturbance, troponin negative, D-dimer negative.  Chest x-ray normal.  Patient Reassessment and Ultimate Disposition/Management     Patient feeling better, heart rate improved down to  107 on my assessment.  Favoring viral illness, appropriate for discharge.  Patient management required discussion with the following services or consulting groups:  None  Complexity of Problems Addressed Acute illness or injury that poses threat of life of bodily function  Additional Data Reviewed and Analyzed Further history obtained from: Further history from spouse/family member  Additional Factors Impacting ED Encounter Risk None  Elmer Sow. Pilar Plate, MD [facility] Emergency Medicine [facility] [email or web address]  Final Clinical Impressions(s) / ED Diagnoses     ICD-10-CM   1. Malaise  R53.81     2. Body aches  R52     3. Shortness of breath  R06.02     4. Dizziness  R42       ED Discharge Orders     None        Discharge Instructions Discussed with and Provided to Patient:     Discharge Instructions      You were evaluated in the Emergency Department and after careful evaluation, we did not find any emergent condition requiring admission or further testing in the hospital.  Your exam/testing today is overall reassuring.  X-ray did not show any signs of pneumonia.  Suspect your symptoms are related to a viral illness.  Recommend Tylenol or Motrin for discomfort, plenty of  fluids and rest at home.  Please return to the Emergency Department if you experience any worsening of your condition.   Thank you for allowing [place] to be a part of your care.       Sabas Sous, MD 10/15/23 [phone]

## 2023-10-21 DIAGNOSIS — R569 Unspecified convulsions: Secondary | ICD-10-CM | POA: Diagnosis not present

## 2023-10-21 DIAGNOSIS — Z9189 Other specified personal risk factors, not elsewhere classified: Secondary | ICD-10-CM | POA: Insufficient documentation

## 2023-10-21 DIAGNOSIS — G43009 Migraine without aura, not intractable, without status migrainosus: Secondary | ICD-10-CM | POA: Diagnosis not present

## 2023-10-29 ENCOUNTER — Ambulatory Visit
Admission: EM | Admit: 2023-10-29 | Discharge: 2023-10-29 | Disposition: A | Discharge status: Home / Self Care | Payer: 59 | Attending: Physician Assistant | Admitting: Physician Assistant

## 2023-10-29 DIAGNOSIS — Z975 Presence of (intrauterine) contraceptive device: Secondary | ICD-10-CM | POA: Insufficient documentation

## 2023-10-29 DIAGNOSIS — J039 Acute tonsillitis, unspecified: Secondary | ICD-10-CM | POA: Insufficient documentation

## 2023-10-29 DIAGNOSIS — J351 Hypertrophy of tonsils: Secondary | ICD-10-CM | POA: Diagnosis not present

## 2023-10-29 LAB — POCT RAPID STREP A (OFFICE): Rapid Strep A Screen: NEGATIVE

## 2023-10-29 MED ORDER — AMOXICILLIN 500 MG PO CAPS: 500.0000 mg | ORAL_CAPSULE | Freq: Three times a day (TID) | ORAL | 0 refills | Status: DC

## 2023-10-29 NOTE — ED Triage Notes (Signed)
"  I have a sore throat that is on/off, mainly in the morning and night". "Initially I though I have sore throat but the pain and ache is persistent". "I am noticing white spots on throat". No fever.

## 2023-10-29 NOTE — ED Provider Notes (Signed)
[facility]    CSN: [id number] Arrival date & time: 10/29/23  1555      History   Chief Complaint Chief Complaint  Patient presents with   Sore Throat    HPI Holly Wagner is a 32 y.o. female.   Patient complains of swelling to her tonsils.  Patient reports that she has had the symptoms for over a week.  Patient reports she has had problems with tonsillitis in the past.  Patient reports that she has had some sinus drainage.  Patient reports no difficulty breathing.  The history is provided by the patient. No language interpreter was used.  Sore Throat    Past Medical History:  Diagnosis Date   ADD (attention deficit disorder)    Anxiety and depression    Asthma    Bipolar 1 disorder (HCC)    Dumping syndrome    GERD (gastroesophageal reflux disease)    Mental disorder    PONV (postoperative nausea and vomiting)    Pre-diabetes     Patient Active Problem List   Diagnosis Date Noted   IUD (intrauterine device) in place 10/29/2023   At risk for sleep apnea 10/21/2023   Seizure-like activity (HCC) 10/21/2023   Chronic constipation 10/08/2023   Essential hypertension 10/08/2023   Hydrosalpinx 07/24/2023   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   Allergic rhinitis 06/12/2023   Asthma 06/12/2023   Bipolar disorder (HCC) 06/12/2023   Eczema 06/12/2023   Migraine without aura and without status migrainosus, not intractable 06/12/2023   Right ovarian cyst 06/12/2023   Mixed stress and urge incontinence 06/12/2023   Severe obesity (BMI 35.0-35.9 with comorbidity) (HCC) 06/12/2023   Controlled type 2 diabetes mellitus without complication, without long-term current use of insulin (HCC) 08/27/2022   Chronic tonsillitis 01/29/2022   Enlarged tonsils 01/29/2022   Throat pain in adult 01/29/2022   Dizziness 06/26/2021   Vertigo 06/26/2021   Tinnitus, bilateral 06/26/2021   Cyclical vomiting syndrome 02/16/2021   Gastroenteritis 02/16/2021   Metabolic  acidosis with normal anion gap and bicarbonate losses 02/16/2021   Abnormal involuntary movement 10/13/2020   Tremor of face and hands 10/13/2020   Marijuana use 11/16/2018   Chronic diarrhea 08/14/2018   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   Gastroesophageal reflux disease without esophagitis 08/14/2018   Bipolar disorder in partial remission (HCC) 01/08/2016   Evaluation regarding contraception options 01/08/2016   Fetal hydronephrosis in pregnancy, antepartum condition 12/28/2015   Group B Streptococcus carrier, +RV culture, currently pregnant 12/24/2015   Gestational diabetes mellitus (GDM) affecting pregnancy 12/21/2015   Irregular contractions 10/31/2015   Indication for care in labor and delivery, antepartum 09/09/2015   Encounter for IUD removal 05/03/2014   Oral contraceptive prescribed 05/03/2014   Skin tag of vaginal mucosa 05/03/2014   Vaginal discharge 05/03/2014    Past Surgical History:  Procedure Laterality Date   BILATERAL SALPINGECTOMY Bilateral 04/13/2021   Procedure: BILATERAL SALPINGECTOMY;  Surgeon: Feliberto Gottron, Ihor Austin, MD;  Location: [facility];  Service: Gynecology;  Laterality: Bilateral;   CHOLECYSTECTOMY  2007   fatty tumor removal  1992   IUD REMOVAL N/A 04/13/2021   Procedure: INTRAUTERINE DEVICE (IUD) REMOVAL;  Surgeon: Schermerhorn, Ihor Austin, MD;  Location: [facility];  Service: Gynecology;  Laterality: N/A;   SINUS SURGERY WITH INSTATRAK     unsure, maybe 4 years ago   VAGINAL HYSTERECTOMY N/A 04/13/2021   Procedure: HYSTERECTOMY VAGINAL;  Surgeon: Schermerhorn, Ihor Austin, MD;  Location: [facility];  Service: Gynecology;  Laterality: N/A;   WISDOM TOOTH EXTRACTION      OB History     Gravida  2   Para  1   Term  1   Preterm      AB      Living  1      SAB      IAB      Ectopic      Multiple      Live Births               Home Medications    Prior to Admission medications   Medication Sig Start Date End Date  Taking? Authorizing Provider  albuterol (VENTOLIN HFA) 108 (90 Base) MCG/ACT inhaler Inhale 2 puffs into the lungs every 6 (six) hours as needed for wheezing or shortness of breath. 06/07/23  Yes Shaune Pollack, MD  aluminum chloride (DRYSOL) 20 % external solution Apply 1 Application topically at bedtime.  APPLY TOPICALLY AT BEDTIME APPLY SOLUTION SPARINGLY. 12/03/22  Yes [provider]  atorvastatin (LIPITOR) 20 MG tablet Take 1 tablet by mouth at bedtime. 10/08/23 10/07/24 Yes [provider]  Cysteamine Bitartrate (PROCYSBI) 300 MG PACK See admin instructions. 12/14/21  Yes [provider]  Dulaglutide 0.75 MG/0.5ML SOAJ Inject 0.75 mg into the skin once a week. 01/06/23  Yes [provider]  DULoxetine (CYMBALTA) 60 MG capsule Take 1 capsule by mouth daily. 08/26/23 08/25/24 Yes [provider]  folic acid (FOLVITE) 400 MCG tablet Take 400 mcg by mouth daily.   Yes [provider]  glucose blood (PRECISION QID TEST) test strip 1 each by Other route as needed for other. Last check (10-28-2023), Result: almost 200. 12/25/22 12/25/23 Yes [provider]  ibuprofen (ADVIL) 400 MG tablet Take 400 mg by mouth every 6 (six) hours as needed.   Yes [provider]  ketoconazole (NIZORAL) 2 % shampoo Apply 1 Application topically 2 (two) times a week. 02/19/22  Yes [provider]  lisinopril (ZESTRIL) 2.5 MG tablet Take 2.5 mg by mouth daily. 06/17/23  Yes [provider]  metFORMIN (GLUCOPHAGE) 500 MG tablet Take 500 mg by mouth 2 (two) times daily.   Yes [provider]  omeprazole (PRILOSEC) 40 MG capsule Take 40 mg by mouth at bedtime.   Yes [provider]  prednisoLONE acetate (PRED FORTE) 1 % ophthalmic suspension 5 drops as needed (USE 5 DROPS IN BOTH EARS DAILY AS NEEDED FOR ITCHING). 01/29/22  Yes [provider]  traZODone (DESYREL) 50 MG tablet Take 50 mg by mouth at bedtime.   Yes  [provider]  valsartan (DIOVAN) 80 MG tablet Take 1 tablet by mouth daily. 10/08/23 10/07/24 Yes [provider]  Accu-Chek Softclix Lancets lancets  01/17/23   [provider]  acetaminophen (TYLENOL) 325 MG tablet Take 325 mg by mouth every 6 (six) hours as needed for mild pain (pain score 1-3), moderate pain (pain score 4-6), fever or headache. 12/23/21   [provider]  amoxicillin (AMOXIL) 875 MG tablet Take 1 tablet (875 mg total) by mouth 2 (two) times daily. 03/24/23   Fisher, Roselyn Bering, PA-C  benzonatate (TESSALON PERLES) 100 MG capsule Take 1 capsule (100 mg total) by mouth 3 (three) times daily as needed for cough. 03/24/23 03/23/24  Fisher, Roselyn Bering, PA-C  colestipol (COLESTID) 1 g tablet Take 2 g by mouth at bedtime. 01/20/21   [provider]  dicyclomine (BENTYL) 20 MG tablet Take 1 tablet (20 mg  total) by mouth 2 (two) times daily. 02/27/22   Marita Kansas, PA-C  DULoxetine (CYMBALTA) 20 MG capsule Take 20 mg by mouth at bedtime.    [provider]  fluticasone (FLONASE) 50 MCG/ACT nasal spray Place 1 spray into both nostrils daily as needed for allergies. 01/24/21   [provider]  HYDROcodone bit-homatropine (HYCODAN) 5-1.5 MG/5ML syrup Take 5 mLs by mouth every 6 (six) hours as needed for cough. 06/07/23   Shaune Pollack, MD  loratadine (CLARITIN) 10 MG tablet Take 10 mg by mouth daily. 01/29/22   [provider]  ondansetron (ZOFRAN-ODT) 4 MG disintegrating tablet Take 1 tablet (4 mg total) by mouth every 8 (eight) hours as needed for nausea or vomiting. 06/07/23   Shaune Pollack, MD  oxyCODONE (OXY IR/ROXICODONE) 5 MG immediate release tablet Take 5 mg by mouth every 6 (six) hours as needed. 05/12/23   [provider]  phenazopyridine (PYRIDIUM) 200 MG tablet Take 200 mg by mouth 3 (three) times daily as needed for pain. 01/28/23   [provider]  polyethylene glycol (MIRALAX) 17 g packet Take one cap  full/package twice daily until BMs are soft and regular, then 1-2 x daily as needed for constipation. 06/22/23   Shaune Pollack, MD  PRECISION QID TEST test strip 1 each as needed. 08/29/23 08/28/24  [provider]  prochlorperazine (COMPAZINE) 5 MG tablet Take 5 mg by mouth every 6 (six) hours as needed for nausea/vomiting. 02/21/21   [provider]  WIXELA INHUB 100-50 MCG/ACT AEPB Inhale 1 puff into the lungs 2 (two) times daily. 06/17/23   [provider]    Family History Family History  Problem Relation Age of Onset   Cancer Other     Social History Social History   Tobacco Use   Smoking status: Former    Current packs/day: 0.00    Types: Cigarettes    Quit date: 04/30/2015    Years since quitting: 8.5   Smokeless tobacco: Never  Vaping Use   Vaping status: Every Day   Substances: Nicotine, Flavoring  Substance Use Topics   Alcohol use: Yes    Comment: Socially.,   Drug use: Yes    Types: Marijuana    Comment: Occassional     Allergies   Morphine, Morphine and codeine, Tizanidine, and Prednisone   Review of Systems Review of Systems  All other systems reviewed and are negative.    Physical Exam Triage Vital Signs ED Triage Vitals  Encounter Vitals Group     BP 10/29/23 1621 127/85     Systolic BP Percentile --      Diastolic BP Percentile --      Pulse Rate 10/29/23 1621 94     Resp 10/29/23 1621 18     Temp 10/29/23 1621 98.1 F (36.7 C)     Temp Source 10/29/23 1621 Oral     SpO2 10/29/23 1621 98 %     Weight 10/29/23 1613 250 lb (113.4 kg)     Height 10/29/23 1613 5\' 6"  (1.676 m)     Head Circumference --      Peak Flow --      Pain Score 10/29/23 1605 1     Pain Loc --      Pain Education --      Exclude from Growth Chart --    No data found.  Updated Vital Signs BP 127/85 (BP Location: Left Arm)   Pulse 94   Temp 98.1 F (36.7  C) (Oral)   Resp 18   Ht 5\' 6"  (1.676 m)   Wt 113.4 kg   LMP 04/12/2021 (Exact Date)    SpO2 98%   BMI 40.35 kg/m   Visual Acuity Right Eye Distance:   Left Eye Distance:   Bilateral Distance:    Right Eye Near:   Left Eye Near:    Bilateral Near:     Physical Exam Vitals and nursing note reviewed.  Constitutional:      Appearance: She is well-developed.  HENT:     Head: Normocephalic.     Mouth/Throat:     Pharynx: Pharyngeal swelling, oropharyngeal exudate and posterior oropharyngeal erythema present.     Tonsils: Tonsillar exudate present.  Cardiovascular:     Rate and Rhythm: Normal rate.  Pulmonary:     Effort: Pulmonary effort is normal.  Abdominal:     General: There is no distension.  Musculoskeletal:        General: Normal range of motion.     Cervical back: Normal range of motion.  Skin:    General: Skin is warm.  Neurological:     General: No focal deficit present.     Mental Status: She is alert and oriented to person, place, and time.      UC Treatments / Results  Labs (all labs ordered are listed, but only abnormal results are displayed) Labs Reviewed  POCT RAPID STREP A (OFFICE)    EKG   Radiology No results found.  Procedures Procedures (including critical care time)  Medications Ordered in UC Medications - No data to display  Initial Impression / Assessment and Plan / UC Course  I have reviewed the triage vital signs and the nursing notes.  Pertinent labs & imaging results that were available during my care of the patient were reviewed by me and considered in my medical decision making (see chart for details).      Final Clinical Impressions(s) / UC Diagnoses   Final diagnoses:  Acute tonsillitis, unspecified etiology  Enlarged tonsils   Discharge Instructions   None    ED Prescriptions     Medication Sig Dispense Auth. Provider   amoxicillin (AMOXIL) 500 MG capsule Take 1 capsule (500 mg total) by mouth 3 (three) times daily. 30 capsule Elson Areas, [place]      PDMP not reviewed this  encounter. An After Visit Summary was printed and given to the patient.       Elson Areas, [place] 11/01/23 1150

## 2023-10-29 NOTE — Discharge Instructions (Addendum)
Return if any problems.

## 2023-11-02 ENCOUNTER — Other Ambulatory Visit: Payer: Self-pay

## 2023-11-02 ENCOUNTER — Emergency Department (HOSPITAL_COMMUNITY)
Admission: EM | Admit: 2023-11-02 | Discharge: 2023-11-02 | Disposition: A | Discharge status: Home / Self Care | Payer: 59 | Attending: Emergency Medicine | Admitting: Emergency Medicine

## 2023-11-02 ENCOUNTER — Encounter (HOSPITAL_COMMUNITY): Payer: Self-pay

## 2023-11-02 DIAGNOSIS — Z7984 Long term (current) use of oral hypoglycemic drugs: Secondary | ICD-10-CM | POA: Diagnosis not present

## 2023-11-02 DIAGNOSIS — E119 Type 2 diabetes mellitus without complications: Secondary | ICD-10-CM | POA: Diagnosis not present

## 2023-11-02 DIAGNOSIS — G40909 Epilepsy, unspecified, not intractable, without status epilepticus: Secondary | ICD-10-CM | POA: Diagnosis not present

## 2023-11-02 DIAGNOSIS — G4489 Other headache syndrome: Secondary | ICD-10-CM | POA: Diagnosis not present

## 2023-11-02 DIAGNOSIS — R739 Hyperglycemia, unspecified: Secondary | ICD-10-CM | POA: Diagnosis not present

## 2023-11-02 DIAGNOSIS — Z85828 Personal history of other malignant neoplasm of skin: Secondary | ICD-10-CM | POA: Diagnosis not present

## 2023-11-02 DIAGNOSIS — R519 Headache, unspecified: Secondary | ICD-10-CM | POA: Diagnosis not present

## 2023-11-02 DIAGNOSIS — I10 Essential (primary) hypertension: Secondary | ICD-10-CM | POA: Diagnosis not present

## 2023-11-02 DIAGNOSIS — Z5982 Transportation insecurity: Secondary | ICD-10-CM | POA: Diagnosis not present

## 2023-11-02 DIAGNOSIS — Z791 Long term (current) use of non-steroidal anti-inflammatories (NSAID): Secondary | ICD-10-CM | POA: Diagnosis not present

## 2023-11-02 DIAGNOSIS — E785 Hyperlipidemia, unspecified: Secondary | ICD-10-CM | POA: Diagnosis not present

## 2023-11-02 DIAGNOSIS — R569 Unspecified convulsions: Secondary | ICD-10-CM | POA: Insufficient documentation

## 2023-11-02 DIAGNOSIS — K219 Gastro-esophageal reflux disease without esophagitis: Secondary | ICD-10-CM | POA: Diagnosis not present

## 2023-11-02 DIAGNOSIS — R000 Tachycardia, unspecified: Secondary | ICD-10-CM | POA: Insufficient documentation

## 2023-11-02 DIAGNOSIS — F319 Bipolar disorder, unspecified: Secondary | ICD-10-CM | POA: Diagnosis not present

## 2023-11-02 DIAGNOSIS — Z7951 Long term (current) use of inhaled steroids: Secondary | ICD-10-CM | POA: Diagnosis not present

## 2023-11-02 DIAGNOSIS — J45909 Unspecified asthma, uncomplicated: Secondary | ICD-10-CM | POA: Diagnosis not present

## 2023-11-02 LAB — CBG MONITORING, ED: Glucose-Capillary: 222 mg/dL — ABNORMAL HIGH (ref 70–99)

## 2023-11-02 LAB — CULTURE, GROUP A STREP (THRC)

## 2023-11-02 LAB — CBC
HCT: 37.4 % (ref 36.0–46.0)
Hemoglobin: 11.9 g/dL — ABNORMAL LOW (ref 12.0–15.0)
MCH: 27.1 pg (ref 26.0–34.0)
MCHC: 31.8 g/dL (ref 30.0–36.0)
MCV: 85.2 fL (ref 80.0–100.0)
Platelets: 368 10*3/uL (ref 150–400)
RBC: 4.39 MIL/uL (ref 3.87–5.11)
RDW: 13.2 % (ref 11.5–15.5)
WBC: 9.8 10*3/uL (ref 4.0–10.5)
nRBC: 0 % (ref 0.0–0.2)

## 2023-11-02 LAB — URINALYSIS, ROUTINE W REFLEX MICROSCOPIC
Bilirubin Urine: NEGATIVE
Glucose, UA: 50 mg/dL — AB
Hgb urine dipstick: NEGATIVE
Ketones, ur: NEGATIVE mg/dL
Leukocytes,Ua: NEGATIVE
Nitrite: NEGATIVE
Protein, ur: NEGATIVE mg/dL
Specific Gravity, Urine: 1.012 (ref 1.005–1.030)
pH: 5 (ref 5.0–8.0)

## 2023-11-02 LAB — BASIC METABOLIC PANEL
Anion gap: 10 (ref 5–15)
BUN: 11 mg/dL (ref 6–20)
CO2: 25 mmol/L (ref 22–32)
Calcium: 8.4 mg/dL — ABNORMAL LOW (ref 8.9–10.3)
Chloride: 102 mmol/L (ref 98–111)
Creatinine, Ser: 0.98 mg/dL (ref 0.44–1.00)
GFR, Estimated: >60 mL/min (ref >60)
Glucose, Bld: 216 mg/dL — ABNORMAL HIGH (ref 70–99)
Potassium: 3.5 mmol/L (ref 3.5–5.1)
Sodium: 137 mmol/L (ref 135–145)

## 2023-11-02 MED ORDER — SODIUM CHLORIDE 0.9 % IV BOLUS (SEPSIS)
1000.0000 mL | Freq: Once | INTRAVENOUS | Status: AC
  Administered 2023-11-02: 1000 mL via INTRAVENOUS

## 2023-11-02 NOTE — ED Triage Notes (Signed)
Pt reports she is currently taking amoxicillin for strep.

## 2023-11-02 NOTE — ED Provider Notes (Signed)
[place] EM[facility]MENT AT [facility] Provider Note   CSN: [id number] Arrival date & time: 11/02/23  0241     History  Chief Complaint  Patient presents with   Seizures    Holly Wagner is a 32 y.o. female.  The history is provided by the patient.  Patient history of bipolar, ADD presents for possible seizures.  Husband reports that patient had at least 4-5 generalized tonic-clonic seizures.  He terminated spontaneously.  She had 1 in route and EMS gave patient Versed. Patient is had increasing seizures over the past year.  She was recently seen in the neurologist office and scheduled for an EEG.  She also had these episodes in the office. No recent fevers or vomiting.  She reports recent headache.  No chest pain or abdominal pain. Denies any alcohol abuse.  No history of drug withdrawal or change in medications No traumatic injuries from the seizure tonight   Past Medical History:  Diagnosis Date   ADD (attention deficit disorder)    Anxiety and depression    Asthma    Bipolar 1 disorder (HCC)    Dumping syndrome    GERD (gastroesophageal reflux disease)    Mental disorder    PONV (postoperative nausea and vomiting)    Pre-diabetes     Home Medications Prior to Admission medications   Medication Sig Start Date End Date Taking? Authorizing Provider  Accu-Chek Softclix Lancets lancets  01/17/23   [provider]  acetaminophen (TYLENOL) 325 MG tablet Take 325 mg by mouth every 6 (six) hours as needed for mild pain (pain score 1-3), moderate pain (pain score 4-6), fever or headache. 12/23/21   [provider]  albuterol (VENTOLIN HFA) 108 (90 Base) MCG/ACT inhaler Inhale 2 puffs into the lungs every 6 (six) hours as needed for wheezing or shortness of breath. 06/07/23   Shaune Pollack, MD  aluminum chloride (DRYSOL) 20 % external solution Apply 1 Application topically at bedtime.  APPLY TOPICALLY AT BEDTIME APPLY SOLUTION SPARINGLY.  12/03/22   [provider]  amoxicillin (AMOXIL) 500 MG capsule Take 1 capsule (500 mg total) by mouth 3 (three) times daily. 10/29/23   Elson Areas, PA-C  atorvastatin (LIPITOR) 20 MG tablet Take 1 tablet by mouth at bedtime. 10/08/23 10/07/24  [provider]  colestipol (COLESTID) 1 g tablet Take 2 g by mouth at bedtime. 01/20/21   [provider]  Cysteamine Bitartrate (PROCYSBI) 300 MG PACK See admin instructions. 12/14/21   [provider]  dicyclomine (BENTYL) 20 MG tablet Take 1 tablet (20 mg total) by mouth 2 (two) times daily. 02/27/22   Marita Kansas, PA-C  Dulaglutide 0.75 MG/0.5ML SOAJ Inject 0.75 mg into the skin once a week. 01/06/23   [provider]  DULoxetine (CYMBALTA) 20 MG capsule Take 20 mg by mouth at bedtime.    [provider]  DULoxetine (CYMBALTA) 60 MG capsule Take 1 capsule by mouth daily. 08/26/23 08/25/24  [provider]  fluticasone (FLONASE) 50 MCG/ACT nasal spray Place 1 spray into both nostrils daily as needed for allergies. 01/24/21   [provider]  folic acid (FOLVITE) 400 MCG tablet Take 400 mcg by mouth daily.    [provider]  glucose blood (PRECISION QID TEST) test strip 1 each by Other route as needed for other. Last check (10-28-2023), Result: almost 200. 12/25/22 12/25/23  [provider]  ibuprofen (ADVIL) 400 MG tablet Take 400 mg by mouth every 6 (six)  hours as needed.    [provider]  ketoconazole (NIZORAL) 2 % shampoo Apply 1 Application topically 2 (two) times a week. 02/19/22   [provider]  lisinopril (ZESTRIL) 2.5 MG tablet Take 2.5 mg by mouth daily. 06/17/23   [provider]  loratadine (CLARITIN) 10 MG tablet Take 10 mg by mouth daily. 01/29/22   [provider]  metFORMIN (GLUCOPHAGE) 500 MG tablet Take 500 mg by mouth 2 (two) times daily.    [provider]  omeprazole (PRILOSEC) 40 MG capsule Take 40 mg by  mouth at bedtime.    [provider]  oxyCODONE (OXY IR/ROXICODONE) 5 MG immediate release tablet Take 5 mg by mouth every 6 (six) hours as needed. 05/12/23   [provider]  phenazopyridine (PYRIDIUM) 200 MG tablet Take 200 mg by mouth 3 (three) times daily as needed for pain. 01/28/23   [provider]  polyethylene glycol (MIRALAX) 17 g packet Take one cap full/package twice daily until BMs are soft and regular, then 1-2 x daily as needed for constipation. 06/22/23   Shaune Pollack, MD  PRECISION QID TEST test strip 1 each as needed. 08/29/23 08/28/24  [provider]  prednisoLONE acetate (PRED FORTE) 1 % ophthalmic suspension 5 drops as needed (USE 5 DROPS IN BOTH EARS DAILY AS NEEDED FOR ITCHING). 01/29/22   [provider]  prochlorperazine (COMPAZINE) 5 MG tablet Take 5 mg by mouth every 6 (six) hours as needed for nausea/vomiting. 02/21/21   [provider]  traZODone (DESYREL) 50 MG tablet Take 50 mg by mouth at bedtime.    [provider]  valsartan (DIOVAN) 80 MG tablet Take 1 tablet by mouth daily. 10/08/23 10/07/24  [provider]  WIXELA INHUB 100-50 MCG/ACT AEPB Inhale 1 puff into the lungs 2 (two) times daily. 06/17/23   [provider]      Allergies    Morphine, Morphine and codeine, Tizanidine, and Prednisone    Review of Systems   Review of Systems  Constitutional:  Negative for fever.  Neurological:  Positive for seizures and headaches.    Physical Exam Updated Vital Signs BP 134/88   Pulse (!) 107   Temp 99.8 F (37.7 C) (Rectal)   Resp 16   Ht 1.676 m (5\' 6" )   Wt 113.4 kg   LMP 04/12/2021 (Exact Date)   SpO2 96%   BMI 40.35 kg/m  Physical Exam CONSTITUTIONAL: Well developed/well nourished HEAD: Normocephalic/atraumatic EYES: EOMI/PERRL, no nystagmus ENMT: Mucous membranes dry, no large tongue lacerations NECK: supple no meningeal signs SPINE/BACK:entire spine nontender CV: S1/S2  noted, tachycardic LUNGS: Lungs are clear to auscultation bilaterally, no apparent distress ABDOMEN: soft, nontender NEURO: Pt is awake/alert/appropriate, moves all extremitiesx4.  No facial droop.  No arm or leg drift EXTREMITIES: pulses normal/equal, full ROM, All other extremities/joints palpated/ranged and nontender SKIN: warm, color normal PSYCH: Anxious  ED Results / Procedures / Treatments   Labs (all labs ordered are listed, but only abnormal results are displayed) Labs Reviewed  CBC - Abnormal; Notable for the following components:      Result Value   Hemoglobin 11.9 (*)    All other components within normal limits  BASIC METABOLIC PANEL - Abnormal; Notable for the following components:   Glucose, Bld 216 (*)    Calcium 8.4 (*)    All other components within normal limits  URINALYSIS, ROUTINE W REFLEX MICROSCOPIC - Abnormal; Notable for the following components:   Glucose, UA 50 (*)  All other components within normal limits  CBG MONITORING, ED - Abnormal; Notable for the following components:   Glucose-Capillary 222 (*)    All other components within normal limits    EKG EKG Interpretation Date/Time:  Sunday November 02 2023 02:47:48 EST Ventricular Rate:  141 PR Interval:  90 QRS Duration:  89 QT Interval:  374 QTC Calculation: 573 R Axis:   13  Text Interpretation: Sinus tachycardia Borderline T abnormalities, inferior leads Prolonged QT interval Confirmed by Zadie Rhine ([id number]) on 11/02/2023 3:15:16 AM  Radiology No results found.  Procedures Procedures    Medications Ordered in ED Medications  sodium chloride 0.9 % bolus 1,000 mL (0 mLs Intravenous Stopped 11/02/23 0430)  sodium chloride 0.9 % bolus 1,000 mL (0 mLs Intravenous Stopped 11/02/23 6045)    ED Course/ Medical Decision Making/ A&P Clinical Course as of 11/02/23 4098  [facility] Nov 02, 2023  0329 Glucose-Capillary(!): 222 Hyperglycemia [DW]  1191 This report patient had multiple  seizures at home.  She has been dealing this for over a year and was just recently seen by neurology.  Per that note, patient had episodes in the office but did not appear to be seizure-like in nature.  She is scheduled for EEG next week.  Patient is awake and alert but is tachycardic and appears dehydrated.  Will give IV fluids. She had recent CT head that was negative.  No new traumatic injuries [DW]  0455 Patient stable.  She is resting comfortably.  Heart rate is improving.  Husband reports she has been recently treated for strep throat and is on amoxicillin.  No recent vomiting or diarrhea.  No other new issues except for the seizures. [DW]  [phone] Patient is feeling improved.  No new seizures.  She is ambulatory. She is scheduled for EEG later this week.  She understands she is not able to drive until further notice [DW]  0700 Patient had drops in her pulse ox while sleeping, but this is improved.  No chest pain or shortness of breath is reported [DW]    Clinical Course User Index [DW] Zadie Rhine, MD                                 Medical Decision Making Amount and/or Complexity of Data Reviewed Labs: ordered. Decision-making details documented in ED Course.   This patient presents to the ED for concern of seizures, this involves an extensive number of treatment options, and is a complaint that carries with it a high risk of complications and morbidity.  The differential diagnosis includes but is not limited to status epilepticus, alcohol withdrawal, drug withdrawal, toxidrome, psychogenic nonepileptic seizures  Comorbidities that complicate the patient evaluation: Patient's presentation is complicated by their history of bipolar  Social Determinants of Health: Patient's  frequent ER visits   increases the complexity of managing their presentation  Additional history obtained: Additional history obtained from spouse Records reviewed Care Everywhere/External Records  Lab  Tests: I Ordered, and personally interpreted labs.  The pertinent results include:  mild hyperglycemia  Cardiac Monitoring: The patient was maintained on a cardiac monitor.  I personally viewed and interpreted the cardiac monitor which showed an underlying rhythm of:  sinus tachycardia  Medicines ordered and prescription drug management: I ordered medication including IV fluids  for dehydration  Reevaluation of the patient after these medicines showed that the patient    improved  Reevaluation: After the  interventions noted above, I reevaluated the patient and found that they have :improved  Complexity of problems addressed: Patient's presentation is most consistent with  acute presentation with potential threat to life or bodily function  Disposition: After consideration of the diagnostic results and the patient's response to treatment,  I feel that the patent would benefit from discharge   .           Final Clinical Impression(s) / ED Diagnoses Final diagnoses:  Seizure-like activity [facility])    Rx / DC Orders ED Discharge Orders     None         Zadie Rhine, MD 11/02/23 [phone]

## 2023-11-02 NOTE — ED Triage Notes (Signed)
Pt bib EMS, pt was asleep, woke up, spouse checked on her and she had 2 tonic clonic seizures lasting about 3 minutes each. Pt does have a h/o seizures. Pt has hot flash auro prior to seizure, does not take seizure medications but seeing a neurologist for seizures. EMS reports pt had seizure in route to hospital and was given 5 mg IM Versed.

## 2023-11-02 NOTE — Discharge Instructions (Signed)
Please be aware you may have another seizure ° °Do not drive until seen by your physician for your condition ° °Do not climb ladders/roofs/trees as a seizure can occur at that height and cause serious harm ° °Do not bathe/swim alone as a seizure can occur and cause serious harm ° °Please followup with your physician or neurologist for further testing and possible treatment ° ° °

## 2023-12-07 ENCOUNTER — Ambulatory Visit
Admission: EM | Admit: 2023-12-07 | Discharge: 2023-12-07 | Disposition: A | Discharge status: Home / Self Care | Payer: 59

## 2023-12-07 DIAGNOSIS — J069 Acute upper respiratory infection, unspecified: Secondary | ICD-10-CM

## 2023-12-07 NOTE — ED Triage Notes (Signed)
Here with Son. "Started about 3 days ago with congestion, cough and I feel like it has moved to my lungs". "I am coughing up a lot of mucous". Some ha today. Low energy. No fever.

## 2024-02-12 ENCOUNTER — Encounter (HOSPITAL_COMMUNITY): Payer: Self-pay

## 2024-02-12 ENCOUNTER — Emergency Department (HOSPITAL_COMMUNITY): Payer: 59

## 2024-02-12 ENCOUNTER — Other Ambulatory Visit: Payer: Self-pay

## 2024-02-12 ENCOUNTER — Observation Stay (HOSPITAL_COMMUNITY)
Admission: EM | Admit: 2024-02-12 | Discharge: 2024-02-13 | Disposition: A | Discharge status: Home / Self Care | Payer: 59 | Attending: Family Medicine | Admitting: Family Medicine

## 2024-02-12 DIAGNOSIS — Z87891 Personal history of nicotine dependence: Secondary | ICD-10-CM | POA: Diagnosis not present

## 2024-02-12 DIAGNOSIS — R569 Unspecified convulsions: Secondary | ICD-10-CM | POA: Diagnosis present

## 2024-02-12 DIAGNOSIS — Z79899 Other long term (current) drug therapy: Secondary | ICD-10-CM | POA: Diagnosis not present

## 2024-02-12 DIAGNOSIS — Z7984 Long term (current) use of oral hypoglycemic drugs: Secondary | ICD-10-CM | POA: Insufficient documentation

## 2024-02-12 DIAGNOSIS — E119 Type 2 diabetes mellitus without complications: Secondary | ICD-10-CM

## 2024-02-12 DIAGNOSIS — I10 Essential (primary) hypertension: Secondary | ICD-10-CM | POA: Diagnosis present

## 2024-02-12 DIAGNOSIS — J45909 Unspecified asthma, uncomplicated: Secondary | ICD-10-CM | POA: Diagnosis not present

## 2024-02-12 DIAGNOSIS — Z720 Tobacco use: Secondary | ICD-10-CM | POA: Diagnosis present

## 2024-02-12 LAB — CBC WITH DIFFERENTIAL/PLATELET
Abs Immature Granulocytes: 0.03 10*3/uL (ref 0.00–0.07)
Basophils Absolute: 0 10*3/uL (ref 0.0–0.1)
Basophils Relative: 0 %
Eosinophils Absolute: 0 10*3/uL (ref 0.0–0.5)
Eosinophils Relative: 0 %
HCT: 36.4 % (ref 36.0–46.0)
Hemoglobin: 11.7 g/dL — ABNORMAL LOW (ref 12.0–15.0)
Immature Granulocytes: 0 %
Lymphocytes Relative: 18 %
Lymphs Abs: 1.7 10*3/uL (ref 0.7–4.0)
MCH: 27.3 pg (ref 26.0–34.0)
MCHC: 32.1 g/dL (ref 30.0–36.0)
MCV: 85 fL (ref 80.0–100.0)
Monocytes Absolute: 0.6 10*3/uL (ref 0.1–1.0)
Monocytes Relative: 6 %
Neutro Abs: 7.1 10*3/uL (ref 1.7–7.7)
Neutrophils Relative %: 76 %
Platelets: 348 10*3/uL (ref 150–400)
RBC: 4.28 MIL/uL (ref 3.87–5.11)
RDW: 13.7 % (ref 11.5–15.5)
WBC: 9.6 10*3/uL (ref 4.0–10.5)
nRBC: 0 % (ref 0.0–0.2)

## 2024-02-12 LAB — BASIC METABOLIC PANEL
Anion gap: 11 (ref 5–15)
BUN: 6 mg/dL (ref 6–20)
CO2: 25 mmol/L (ref 22–32)
Calcium: 8.4 mg/dL — ABNORMAL LOW (ref 8.9–10.3)
Chloride: 101 mmol/L (ref 98–111)
Creatinine, Ser: 0.91 mg/dL (ref 0.44–1.00)
GFR, Estimated: >60 mL/min (ref >60)
Glucose, Bld: 124 mg/dL — ABNORMAL HIGH (ref 70–99)
Potassium: 3.7 mmol/L (ref 3.5–5.1)
Sodium: 137 mmol/L (ref 135–145)

## 2024-02-12 LAB — I-STAT CG4 LACTIC ACID, ED: Lactic Acid, Venous: 0.8 mmol/L (ref 0.5–1.9)

## 2024-02-12 LAB — CK: Total CK: 84 U/L (ref 38–234)

## 2024-02-12 LAB — CBG MONITORING, ED: Glucose-Capillary: 117 mg/dL — ABNORMAL HIGH (ref 70–99)

## 2024-02-12 NOTE — ED Provider Notes (Signed)
 [place] EMERGENCY DEPARTMENT AT [facility] Provider Note   CSN: [id number] Arrival date & time: 02/12/24  1549     History  Chief Complaint  Patient presents with   Seizures    LEZLIE RITCHEY is a 33 y.o. female.  Patient is a 33 year old female presenting for seizure-like motions as witnessed by her husband today.  He admits to multiple discrete episodes of full body shaking like activity.  Patient also was witnessed with this shaking-like motions while in route via EMS but was conscious.  Shaking was reported to have stopped when patient was verbally addressed.  Chart review demonstrates patient is seen multiple times for seizure-like activity.  Under my chart review have not seen any assessments by neurology.  She is been recommended for outpatient follow-up but has canceled prior to seeing a neurologist.  She has never received an EEG.  She is not on any antiseizure like medications.  Some previous diagnosis of nonepileptic seizures.  Patient received 5 of Versed and route.  The history is provided by the patient. No language interpreter was used.  Seizures      Home Medications Prior to Admission medications   Medication Sig Start Date End Date Taking? Authorizing Provider  Accu-Chek Softclix Lancets lancets  01/17/23   [provider]  acetaminophen (TYLENOL) 325 MG tablet Take 325 mg by mouth every 6 (six) hours as needed for mild pain (pain score 1-3), moderate pain (pain score 4-6), fever or headache. 12/23/21   [provider]  albuterol (VENTOLIN HFA) 108 (90 Base) MCG/ACT inhaler Inhale 2 puffs into the lungs every 6 (six) hours as needed for wheezing or shortness of breath. 06/07/23   Shaune Pollack, MD  aluminum chloride (DRYSOL) 20 % external solution Apply 1 Application topically at bedtime.  APPLY TOPICALLY AT BEDTIME APPLY SOLUTION SPARINGLY. 12/03/22   [provider]  amoxicillin (AMOXIL) 500 MG capsule Take 1 capsule (500  mg total) by mouth 3 (three) times daily. 10/29/23   Elson Areas, PA-C  atorvastatin (LIPITOR) 20 MG tablet Take 1 tablet by mouth at bedtime. 10/08/23 10/07/24  [provider]  colestipol (COLESTID) 1 g tablet Take 2 g by mouth at bedtime. 01/20/21   [provider]  Cysteamine Bitartrate (PROCYSBI) 300 MG PACK See admin instructions. 12/14/21   [provider]  dicyclomine (BENTYL) 20 MG tablet Take 1 tablet (20 mg total) by mouth 2 (two) times daily. 02/27/22   Marita Kansas, PA-C  Dulaglutide 0.75 MG/0.5ML SOAJ Inject 0.75 mg into the skin once a week. 01/06/23   [provider]  DULoxetine (CYMBALTA) 20 MG capsule Take 20 mg by mouth at bedtime.    [provider]  DULoxetine (CYMBALTA) 60 MG capsule Take 1 capsule by mouth daily. 08/26/23 08/25/24  [provider]  fluticasone (FLONASE) 50 MCG/ACT nasal spray Place 1 spray into both nostrils daily as needed for allergies. 01/24/21   [provider]  folic acid (FOLVITE) 400 MCG tablet Take 400 mcg by mouth daily.    [provider]  FOLIC ACID PO Take 1 tablet by mouth daily.    [provider]  ibuprofen (ADVIL) 400 MG tablet Take 400 mg by mouth every 6 (six) hours as needed.    [provider]  ketoconazole (NIZORAL) 2 % shampoo Apply 1 Application topically 2 (two) times a week. 02/19/22   [provider]  lisinopril (ZESTRIL) 2.5 MG tablet Take 2.5 mg by mouth daily. 06/17/23  [provider]  loratadine (CLARITIN) 10 MG tablet Take 10 mg by mouth daily. 01/29/22   [provider]  metFORMIN (GLUCOPHAGE) 500 MG tablet Take 500 mg by mouth 2 (two) times daily.    [provider]  omeprazole (PRILOSEC) 40 MG capsule Take 40 mg by mouth at bedtime.    [provider]  oxyCODONE (OXY IR/ROXICODONE) 5 MG immediate release tablet Take 5 mg by mouth every 6 (six) hours as needed. 05/12/23   [provider]   phenazopyridine (PYRIDIUM) 200 MG tablet Take 200 mg by mouth 3 (three) times daily as needed for pain. 01/28/23   [provider]  polyethylene glycol (MIRALAX) 17 g packet Take one cap full/package twice daily until BMs are soft and regular, then 1-2 x daily as needed for constipation. 06/22/23   Shaune Pollack, MD  PRECISION QID TEST test strip 1 each as needed. 08/29/23 08/28/24  [provider]  prednisoLONE acetate (PRED FORTE) 1 % ophthalmic suspension 5 drops as needed (USE 5 DROPS IN BOTH EARS DAILY AS NEEDED FOR ITCHING). 01/29/22   [provider]  prochlorperazine (COMPAZINE) 5 MG tablet Take 5 mg by mouth every 6 (six) hours as needed for nausea/vomiting. 02/21/21   [provider]  traZODone (DESYREL) 50 MG tablet Take 50 mg by mouth at bedtime.    [provider]  valsartan (DIOVAN) 80 MG tablet Take 1 tablet by mouth daily. 10/08/23 10/07/24  [provider]  WIXELA INHUB 100-50 MCG/ACT AEPB Inhale 1 puff into the lungs 2 (two) times daily. 06/17/23   [provider]      Allergies    Morphine and codeine, Tizanidine, and Prednisone    Review of Systems   Review of Systems  Constitutional:  Negative for chills and fever.  HENT:  Negative for ear pain and sore throat.   Eyes:  Negative for pain and visual disturbance.  Respiratory:  Negative for cough and shortness of breath.   Cardiovascular:  Negative for chest pain and palpitations.  Gastrointestinal:  Negative for abdominal pain and vomiting.  Genitourinary:  Negative for dysuria and hematuria.  Musculoskeletal:  Negative for arthralgias and back pain.  Skin:  Negative for color change and rash.  Neurological:  Positive for seizures. Negative for syncope.  All other systems reviewed and are negative.   Physical Exam Updated Vital Signs BP 114/74   Pulse 89   Temp 98.7 F (37.1 C)   Resp 12   Ht 5\' 6"  (1.676 m)   Wt 122.5 kg   LMP 04/12/2021 (Exact Date)    SpO2 98%   BMI 43.58 kg/m  Physical Exam Vitals and nursing note reviewed.  Constitutional:      General: She is not in acute distress.    Appearance: She is well-developed.  HENT:     Head: Normocephalic and atraumatic.  Eyes:     General: Lids are normal. Vision grossly intact.     Conjunctiva/sclera: Conjunctivae normal.     Pupils: Pupils are equal, round, and reactive to light.  Cardiovascular:     Rate and Rhythm: Normal rate and regular rhythm.     Heart sounds: No murmur heard. Pulmonary:     Effort: Pulmonary effort is normal. No respiratory distress.     Breath sounds: Normal breath sounds.  Abdominal:     Palpations: Abdomen is soft.     Tenderness: There is no abdominal tenderness.  Musculoskeletal:  General: No swelling.     Cervical back: Neck supple.  Skin:    General: Skin is warm and dry.     Capillary Refill: Capillary refill takes less than 2 seconds.  Neurological:     Mental Status: She is alert and oriented to person, place, and time.     GCS: GCS eye subscore is 4. GCS verbal subscore is 5. GCS motor subscore is 6.     Cranial Nerves: Cranial nerves 2-12 are intact.     Sensory: Sensation is intact.     Motor: Motor function is intact.     Coordination: Coordination is intact.  Psychiatric:        Mood and Affect: Mood normal.     ED Results / Procedures / Treatments   Labs (all labs ordered are listed, but only abnormal results are displayed) Labs Reviewed  CBC WITH DIFFERENTIAL/PLATELET - Abnormal; Notable for the following components:      Result Value   Hemoglobin 11.7 (*)    All other components within normal limits  BASIC METABOLIC PANEL - Abnormal; Notable for the following components:   Glucose, Bld 124 (*)    Calcium 8.4 (*)    All other components within normal limits  CBG MONITORING, ED - Abnormal; Notable for the following components:   Glucose-Capillary 117 (*)    All other components within normal limits  CK  RAPID  URINE DRUG SCREEN, HOSP PERFORMED  I-STAT CG4 LACTIC ACID, ED  I-STAT CG4 LACTIC ACID, ED    EKG None  Radiology CT Head Wo Contrast Result Date: 02/12/2024 CLINICAL DATA:  Seizure disorder EXAM: CT HEAD WITHOUT CONTRAST TECHNIQUE: Contiguous axial images were obtained from the base of the skull through the vertex without intravenous contrast. RADIATION DOSE REDUCTION: This exam was performed according to the departmental dose-optimization program which includes automated exposure control, adjustment of the mA and/or kV according to patient size and/or use of iterative reconstruction technique. COMPARISON:  10/06/2023 FINDINGS: Brain: No evidence of acute infarction, hemorrhage, hydrocephalus, extra-axial collection or mass lesion/mass effect. Vascular: No hyperdense vessel or unexpected calcification. Skull: Normal. Negative for fracture or focal lesion. Sinuses/Orbits: No acute finding. Other: None. IMPRESSION: No acute intracranial abnormality noted. Electronically Signed   By: Alcide Clever M.D.   On: 02/12/2024 20:19    Procedures Procedures    Medications Ordered in ED Medications - No data to display  ED Course/ Medical Decision Making/ A&P                                 Medical Decision Making Amount and/or Complexity of Data Reviewed Labs: ordered.  Risk Decision regarding hospitalization.   33 year old female presenting for seizure-like motions as witnessed by her husband today.  Patient is alert and oriented x 3, no acute distress, afebrile, some vital signs.  Physical exam demonstrated no neurovascular deficits.  Patient appears tired which is to be expected after 5 of Versed, possibly postictal, on exam but is easily arousable.  Patient has stable labs and electrolytes.  I spoke with our neurologist Dr. Derry Lory Reading patient's symptoms, previous evaluations in the ED, no EEGs.  Sounds like patient's symptoms are likely nonepileptic however we cannot truly determine  that without a EEG.  He does recommend admission for EEG at this time.  We both agree that the patient should know whether or not her symptoms are epileptic or not so that she can  properly utilize the emergency department and receive the proper outpatient care whether it is from neurology versus psychiatric services.  Accepted by admitting team.        Final Clinical Impression(s) / ED Diagnoses Final diagnoses:  Seizure-like activity [facility])    Rx / DC Orders ED Discharge Orders     None         Franne Forts, DO 02/12/24 2352

## 2024-02-12 NOTE — Progress Notes (Signed)
 LTM EEG hooked up and running - no initial skin breakdown - push button tested - No [facility]. Patient is in the ED.

## 2024-02-12 NOTE — H&P (Signed)
 Holly Wagner:[id number] DOB: 27-Jan-1991 DOA: 02/12/2024     PCP: Thomasenia Sales, Shaune Leeks, FNP   Outpatient Specialists: * NONE CARDS: * Dr. None  NEphrology: *  Dr. No care team member to display  NEurology *   Dr. Pulmonary *  Dr.  Oncology * Dr.No care team member to display  GI* Dr.  Deboraha Sprang, [facility]) No care team member to display Urology Dr. *  Patient arrived to ER on 02/12/24 at 1549 Referred by Attending Franne Forts, DO   Patient coming from:    home Lives  With family    Chief Complaint:   Chief Complaint  Patient presents with   Seizures    HPI: Holly Wagner is a 33 y.o. female with medical history significant of ***     Presented with multiple episodes of seizure-like activity no postictal state Was brought in from home with witnessed seizures unclear duration Patient has never been treated for seizures Received 5 mg of Versed Had another episode in route but stopped when was addressed verbally Blood pressure 122/76 heart rate 112 no postictal state Episodes were witnessed by husband Multiple episodes of full body shaking Patient was supposed to follow-up as it outpatient neurology as she had had in the past similar presentations No prior history EEG      Denies significant ETOH intake *** Does not smoke*** but interested in quitting***  Lab Results  Component Value Date   SARSCOV2NAA NEGATIVE 10/15/2023   SARSCOV2NAA NEGATIVE 03/24/2023   SARSCOV2NAA NEGATIVE 04/11/2021        Regarding pertinent Chronic problems:    ****Hyperlipidemia - *on statins {statin:315258}  Lipid Panel     Component Value Date/Time   CHOL 118 01/28/2014 0537   TRIG 59 01/28/2014 0537   HDL 30 (L) 01/28/2014 0537   VLDL 12 01/28/2014 0537   LDLCALC 76 01/28/2014 0537    ***HTN on   ***chronic CHF diastolic/systolic/ combined - last echo*** No results found for this or any previous visit (from the past 81191 hours).  *** CAD  - On  Aspirin, statin, betablocker, Plavix                 - *followed by cardiology                - last cardiac cath       ***DM 2 - No results found for: "HGBA1C" ****on insulin, PO meds only, diet controlled  ***Hypothyroidism:   Lab Results  Component Value Date   TSH 1.728 08/07/2018   on synthroid  *** Morbid obesity-   BMI Readings from Last 1 Encounters:  02/12/24 43.58 kg/m     *** Asthma -well *** controlled on home inhalers/ nebs                     *** COPD - not **followed by pulmonology *** not  on baseline oxygen  *L,    *** OSA -on nocturnal oxygen, *CPAP, *noncompliant with CPAP  *** Hx of CVA - *with/out residual deficits on Aspirin 81 mg, 325, Plavix  ***A. Fib -   atrial fibrillation CHA2DS2 vas score **** CHA2DS2/VAS Stroke Risk Points      N/A >= 2 Points: High Risk  1 to 1.99 Points: Medium Risk  0 Points: Low Risk    Last Change: N/A      This score determines the patient's risk of having a stroke if the  patient  has atrial fibrillation.      This score is not applicable to this patient. Components are not  calculated.     current  on anticoagulation with ****Coumadin  ***Xarelto,* Eliquis,  *** Not on anticoagulation secondary to Risk of Falls, *** recurrent bleeding         -  Rate control:  Currently controlled with ***Toprolol,  *Metoprolol,* Diltiazem, *Coreg          - Rhythm control: *** amiodarone, *flecainide  ***Hx of DVT/PE on - anticoagulation with ****Coumadin  ***Xarelto,* Eliquis,     ***CKD stage III*-   baseline Cr **** Estimated Creatinine Clearance: 116.3 mL/min (by C-G formula based on SCr of 0.91 mg/dL).  Lab Results  Component Value Date   CREATININE 0.91 02/12/2024   CREATININE 0.98 11/02/2023   CREATININE 0.93 10/15/2023   Lab Results  Component Value Date   NA 137 02/12/2024   CL 101 02/12/2024   K 3.7 02/12/2024   CO2 25 02/12/2024   BUN 6 02/12/2024   CREATININE 0.91 02/12/2024   GFRNONAA >60 02/12/2024    CALCIUM 8.4 (L) 02/12/2024   ALBUMIN 3.8 10/06/2023   GLUCOSE 124 (H) 02/12/2024    **** Liver disease Computed MELD 3.0 unavailable. One or more values for this score either were not found within the given timeframe or did not fit some other criterion. Computed MELD-Na unavailable. One or more values for this score either were not found within the given timeframe or did not fit some other criterion.  Hepatic Function Panel     Component Value Date/Time   PROT 6.8 10/06/2023 0150   PROT 7.4 05/31/2014 1007   ALBUMIN 3.8 10/06/2023 0150   ALBUMIN 3.6 05/31/2014 1007   AST 32 10/06/2023 0150   AST 31 05/31/2014 1007   ALT 33 10/06/2023 0150   ALT 32 05/31/2014 1007   ALKPHOS 62 10/06/2023 0150   ALKPHOS 65 05/31/2014 1007   BILITOT 0.2 (L) 10/06/2023 0150   BILITOT 0.9 05/31/2014 1007   No results found for requested labs within last 1095 days.  ***BPH - on Flomax, Proscar    *** Dementia - on Aricept** Nemenda  *** Chronic anemia - baseline hg Hemoglobin & Hematocrit  Recent Labs    10/15/23 0306 11/02/23 0255 02/12/24 1733  HGB 11.8* 11.9* 11.7*   Iron/TIBC/Ferritin/ %Sat No results found for: "IRON", "TIBC", "FERRITIN", "IRONPCTSAT"   Seizure DO - las seizure *** currently on      While in ER:   CT head nonacute     Lab Orders         CBC with Differential         Basic metabolic panel         Rapid urine drug screen (hospital performed)         CK         CBG monitoring, ED         I-Stat CG4 Lactic Acid      CT HEAD   NON acute     Following Medications were ordered in ER: Medications - No data to display  _______________________________________________________ ER Provider Called:       DrMarland Kitchen  They Recommend admit to medicine *** Will see in AM  ***SEEN in ER     ED Triage Vitals  Encounter Vitals Group     BP 02/12/24 1555 114/67     Systolic BP Percentile --      Diastolic BP Percentile --  Pulse Rate 02/12/24 1555 (!) 102      Resp 02/12/24 1555 18     Temp 02/12/24 1605 98.9 F (37.2 C)     Temp src --      SpO2 02/12/24 1555 98 %     Weight 02/12/24 1618 270 lb (122.5 kg)     Height 02/12/24 1618 5\' 6"  (1.676 m)     Head Circumference --      Peak Flow --      Pain Score 02/12/24 1618 0     Pain Loc --      Pain Education --      Exclude from Growth Chart --   NWGN(56)@     _________________________________________ Significant initial  Findings: Abnormal Labs Reviewed  CBC WITH DIFFERENTIAL/PLATELET - Abnormal; Notable for the following components:      Result Value   Hemoglobin 11.7 (*)    All other components within normal limits  BASIC METABOLIC PANEL - Abnormal; Notable for the following components:   Glucose, Bld 124 (*)    Calcium 8.4 (*)    All other components within normal limits  CBG MONITORING, ED - Abnormal; Notable for the following components:   Glucose-Capillary 117 (*)    All other components within normal limits    Cardiac Panel (last 3 results) Recent Labs    02/12/24 1733  CKTOTAL 84    ECG: Ordered Personally reviewed and interpreted by me showing: HR : 103 Rhythm: Sinus tachycardia Low voltage, precordial leads QTC 403  BNP (last 3 results) No results for input(s): "BNP" in the last 8760 hours.   COVID-19 Labs  No results for input(s): "DDIMER", "FERRITIN", "LDH", "CRP" in the last 72 hours.  Lab Results  Component Value Date   SARSCOV2NAA NEGATIVE 10/15/2023   SARSCOV2NAA NEGATIVE 03/24/2023   SARSCOV2NAA NEGATIVE 04/11/2021       The recent clinical data is shown below. Vitals:   02/12/24 1618 02/12/24 1815 02/12/24 2101 02/12/24 2219  BP:  106/85  114/74  Pulse:  94  89  Resp:  14  12  Temp:   98.7 F (37.1 C)   SpO2:  95%  98%  Weight: 122.5 kg     Height: 5\' 6"  (1.676 m)       WBC     Component Value Date/Time   WBC 9.6 02/12/2024 1733   LYMPHSABS 1.7 02/12/2024 1733   LYMPHSABS 3.6 11/25/2014 2135   MONOABS 0.6 02/12/2024 1733   MONOABS  0.9 11/25/2014 2135   EOSABS 0.0 02/12/2024 1733   EOSABS 0.1 11/25/2014 2135   BASOSABS 0.0 02/12/2024 1733   BASOSABS 0.1 11/25/2014 2135    Lactic Acid, Venous    Component Value Date/Time   LATICACIDVEN 0.8 02/12/2024 1742       UA   ordered    Results for orders placed or performed during the hospital encounter of 10/29/23  Culture, group A strep     Status: None   Collection Time: 10/29/23  6:31 PM   Specimen: Throat  Result Value Ref Range Status   Specimen Description THROAT  Final   Special Requests NONE  Final   Culture   Final    NO GROUP A STREP (S.PYOGENES) ISOLATED Performed at [facility], [address] [place], [place] [address]    Report Status 11/02/2023 FINAL  Final      __________________________________________________________ Recent Labs  Lab 02/12/24 1733  NA 137  K 3.7  CO2 25  GLUCOSE 124*  BUN 6  CREATININE 0.91  CALCIUM 8.4*    Cr  stable,   Lab Results  Component Value Date   CREATININE 0.91 02/12/2024   CREATININE 0.98 11/02/2023   CREATININE 0.93 10/15/2023    No results for input(s): "AST", "ALT", "ALKPHOS", "BILITOT", "PROT", "ALBUMIN" in the last 168 hours. Lab Results  Component Value Date   CALCIUM 8.4 (L) 02/12/2024    Plt: Lab Results  Component Value Date   PLT 348 02/12/2024       Recent Labs  Lab 02/12/24 1733  WBC 9.6  NEUTROABS 7.1  HGB 11.7*  HCT 36.4  MCV 85.0  PLT 348    HG/HCT  stable,     Component Value Date/Time   HGB 11.7 (L) 02/12/2024 1733   HGB 13.5 11/25/2014 2135   HCT 36.4 02/12/2024 1733   HCT 42.4 11/25/2014 2135   MCV 85.0 02/12/2024 1733   MCV 91 11/25/2014 2135    _______________________________________________ Hospitalist was called for admission for   Seizure-like activity   The following Work up has been ordered so far:  Orders Placed This Encounter  Procedures   CT Head Wo Contrast   CBC with Differential   Basic metabolic panel   Rapid urine drug screen  (hospital performed)   CK   Diet NPO time specified   ED Cardiac monitoring   Consult to neurology   Consult to hospitalist   CBG monitoring, ED   I-Stat CG4 Lactic Acid   EKG 12-Lead   Overnight EEG with video   Seizure precautions     OTHER Significant initial  Findings:  labs showing:     DM  labs:  HbA1C: No results for input(s): "HGBA1C" in the last 8760 hours.     CBG (last 3)  Recent Labs    02/12/24 1552  GLUCAP 117*          Cultures:    Component Value Date/Time   SDES THROAT 10/29/2023 1831   SPECREQUEST NONE 10/29/2023 1831   CULT  10/29/2023 1831    NO GROUP A STREP (S.PYOGENES) ISOLATED Performed at [facility], [address], [place], [place] [address]    REPTSTATUS 11/02/2023 FINAL 10/29/2023 1831     Radiological Exams on Admission: CT Head Wo Contrast Result Date: 02/12/2024 CLINICAL DATA:  Seizure disorder EXAM: CT HEAD WITHOUT CONTRAST TECHNIQUE: Contiguous axial images were obtained from the base of the skull through the vertex without intravenous contrast. RADIATION DOSE REDUCTION: This exam was performed according to the departmental dose-optimization program which includes automated exposure control, adjustment of the mA and/or kV according to patient size and/or use of iterative reconstruction technique. COMPARISON:  10/06/2023 FINDINGS: Brain: No evidence of acute infarction, hemorrhage, hydrocephalus, extra-axial collection or mass lesion/mass effect. Vascular: No hyperdense vessel or unexpected calcification. Skull: Normal. Negative for fracture or focal lesion. Sinuses/Orbits: No acute finding. Other: None. IMPRESSION: No acute intracranial abnormality noted. Electronically Signed   By: Alcide Clever M.D.   On: 02/12/2024 20:19   _______________________________________________________________________________________________________ Latest  Blood pressure 114/74, pulse 89, temperature 98.7 F (37.1 C), resp. rate 12, height 5\' 6"   (1.676 m), weight 122.5 kg, last menstrual period 04/12/2021, SpO2 98%.   Vitals  labs and radiology finding personally reviewed  Review of Systems:    Pertinent positives include:   no localizing neurological complaints  Constitutional:  No weight loss, night sweats, Fevers, chills, fatigue, weight loss  HEENT:  No headaches, Difficulty swallowing,Tooth/dental problems,Sore  throat,  No sneezing, itching, ear ache, nasal congestion, post nasal drip,  Cardio-vascular:  No chest pain, Orthopnea, PND, anasarca, dizziness, palpitations.no Bilateral lower extremity swelling  GI:  No heartburn, indigestion, abdominal pain, nausea, vomiting, diarrhea, change in bowel habits, loss of appetite, melena, blood in stool, hematemesis Resp:  no shortness of breath at rest. No dyspnea on exertion, No excess mucus, no productive cough, No non-productive cough, No coughing up of blood.No change in color of mucus.No wheezing. Skin:  no rash or lesions. No jaundice GU:  no dysuria, change in color of urine, no urgency or frequency. No straining to urinate.  No flank pain.  Musculoskeletal:  No joint pain or no joint swelling. No decreased range of motion. No back pain.  Psych:  No change in mood or affect. No depression or anxiety. No memory loss.  Neuro:, no tingling, no weakness, no double vision, no gait abnormality, no slurred speech, no confusion  All systems reviewed and apart from HOPI all are negative _______________________________________________________________________________________________ Past Medical History:   Past Medical History:  Diagnosis Date   ADD (attention deficit disorder)    Anxiety and depression    Asthma    Bipolar 1 disorder (HCC)    Dumping syndrome    GERD (gastroesophageal reflux disease)    Mental disorder    PONV (postoperative nausea and vomiting)    Pre-diabetes      Past Surgical History:  Procedure Laterality Date   BILATERAL SALPINGECTOMY  Bilateral 04/13/2021   Procedure: BILATERAL SALPINGECTOMY;  Surgeon: Schermerhorn, Ihor Austin, MD;  Location: [facility] OR[facility];  Service: Gynecology;  Laterality: Bilateral;   CHOLECYSTECTOMY  2007   fatty tumor removal  1992   IUD REMOVAL N/A 04/13/2021   Procedure: INTRAUTERINE DEVICE (IUD) REMOVAL;  Surgeon: Schermerhorn, Ihor Austin, MD;  Location: [facility] OR[facility];  Service: Gynecology;  Laterality: N/A;   SINUS SURGERY WITH INSTATRAK     unsure, maybe 4 years ago   VAGINAL HYSTERECTOMY N/A 04/13/2021   Procedure: HYSTERECTOMY VAGINAL;  Surgeon: Schermerhorn, Ihor Austin, MD;  Location: [facility] OR[facility];  Service: Gynecology;  Laterality: N/A;   WISDOM TOOTH EXTRACTION      Social History:  Ambulatory   independently      reports that she quit smoking about 8 years ago. Her smoking use included cigarettes. She has never used smokeless tobacco. She reports current alcohol use. She reports current drug use. Drug: Marijuana.   Family History:  Family History  Problem Relation Age of Onset   Cancer Other    ______________________________________________________________________________________________ Allergies: Allergies  Allergen Reactions   Morphine And Codeine Shortness Of Breath and Anxiety   Tizanidine Other (See Comments)    Psychosis  Other Reaction(s): Hallucination, Mental Status Changes, Other (See Comments), Psychosis  Crying, "put me in a bad mental place"  Crying, "put me in a bad mental place"    Psychosis   Prednisone Other (See Comments)    "Very emotional. Can't stop crying."  Other Reaction(s): Other (See Comments)  "Very emotional. Can't stop crying." Reports it mainly happens with the taper down pack, not with low dose steroid use.     Prior to Admission medications   Medication Sig Start Date End Date Taking? Authorizing Provider  Accu-Chek Softclix Lancets lancets  01/17/23   [provider]  acetaminophen (TYLENOL) 325 MG tablet Take 325 mg by mouth every 6 (six) hours  as needed for mild pain (pain score 1-3), moderate pain (pain score 4-6), fever or headache. 12/23/21  [provider]  albuterol (VENTOLIN HFA) 108 (90 Base) MCG/ACT inhaler Inhale 2 puffs into the lungs every 6 (six) hours as needed for wheezing or shortness of breath. 06/07/23   Shaune Pollack, MD  aluminum chloride (DRYSOL) 20 % external solution Apply 1 Application topically at bedtime.  APPLY TOPICALLY AT BEDTIME APPLY SOLUTION SPARINGLY. 12/03/22   [provider]  amoxicillin (AMOXIL) 500 MG capsule Take 1 capsule (500 mg total) by mouth 3 (three) times daily. 10/29/23   Elson Areas, PA-C  atorvastatin (LIPITOR) 20 MG tablet Take 1 tablet by mouth at bedtime. 10/08/23 10/07/24  [provider]  colestipol (COLESTID) 1 g tablet Take 2 g by mouth at bedtime. 01/20/21   [provider]  Cysteamine Bitartrate (PROCYSBI) 300 MG PACK See admin instructions. 12/14/21   [provider]  dicyclomine (BENTYL) 20 MG tablet Take 1 tablet (20 mg total) by mouth 2 (two) times daily. 02/27/22   Marita Kansas, PA-C  Dulaglutide 0.75 MG/0.5ML SOAJ Inject 0.75 mg into the skin once a week. 01/06/23   [provider]  DULoxetine (CYMBALTA) 20 MG capsule Take 20 mg by mouth at bedtime.    [provider]  DULoxetine (CYMBALTA) 60 MG capsule Take 1 capsule by mouth daily. 08/26/23 08/25/24  [provider]  fluticasone (FLONASE) 50 MCG/ACT nasal spray Place 1 spray into both nostrils daily as needed for allergies. 01/24/21   [provider]  folic acid (FOLVITE) 400 MCG tablet Take 400 mcg by mouth daily.    [provider]  FOLIC ACID PO Take 1 tablet by mouth daily.    [provider]  ibuprofen (ADVIL) 400 MG tablet Take 400 mg by mouth every 6 (six) hours as needed.    [provider]  ketoconazole (NIZORAL) 2 % shampoo Apply 1 Application topically 2 (two) times a week. 02/19/22   [provider]   lisinopril (ZESTRIL) 2.5 MG tablet Take 2.5 mg by mouth daily. 06/17/23   [provider]  loratadine (CLARITIN) 10 MG tablet Take 10 mg by mouth daily. 01/29/22   [provider]  metFORMIN (GLUCOPHAGE) 500 MG tablet Take 500 mg by mouth 2 (two) times daily.    [provider]  omeprazole (PRILOSEC) 40 MG capsule Take 40 mg by mouth at bedtime.    [provider]  oxyCODONE (OXY IR/ROXICODONE) 5 MG immediate release tablet Take 5 mg by mouth every 6 (six) hours as needed. 05/12/23   [provider]  phenazopyridine (PYRIDIUM) 200 MG tablet Take 200 mg by mouth 3 (three) times daily as needed for pain. 01/28/23   [provider]  polyethylene glycol (MIRALAX) 17 g packet Take one cap full/package twice daily until BMs are soft and regular, then 1-2 x daily as needed for constipation. 06/22/23   Shaune Pollack, MD  PRECISION QID TEST test strip 1 each as needed. 08/29/23 08/28/24  [provider]  prednisoLONE acetate (PRED FORTE) 1 % ophthalmic suspension 5 drops as needed (USE 5 DROPS IN BOTH EARS DAILY AS NEEDED FOR ITCHING). 01/29/22   [provider]  prochlorperazine (COMPAZINE) 5 MG tablet Take 5 mg by mouth every 6 (six) hours as needed for nausea/vomiting. 02/21/21   [provider]  traZODone (DESYREL) 50 MG tablet Take 50 mg by mouth at bedtime.    [provider]  valsartan (DIOVAN) 80 MG tablet Take 1 tablet by mouth daily. 10/08/23 10/07/24  [provider]  Monte Fantasia INHUB 100-50  MCG/ACT AEPB Inhale 1 puff into the lungs 2 (two) times daily. 06/17/23   [provider]    ___________________________________________________________________________________________________ Physical Exam:    02/12/2024   10:19 PM 02/12/2024    6:15 PM 02/12/2024    4:18 PM  Vitals with BMI  Height   5\' 6"   Weight   270 lbs  BMI   43.6  Systolic 114 106   Diastolic 74 85   Pulse 89 94      1. General:   in No  Acute distress   well -appearing 2. Psychological: Alert and   Oriented 3. Head/ENT:     Mucous Membranes                          Head Non traumatic, neck supple                        Poor Dentition 4. SKIN:  decreased Skin turgor,  Skin clean Dry and intact no rash    5. Heart: Regular rate and rhythm no*** Murmur, no Rub or gallop 6. Lungs: ***Clear to auscultation bilaterally, no wheezes or crackles   7. Abdomen: Soft, ***non-tender, Non distended *** obese ***bowel sounds present 8. Lower extremities: no clubbing, cyanosis, no ***edema 9. Neurologically Grossly intact, moving all 4 extremities equally *** strength 5 out of 5 in all 4 extremities cranial nerves II through XII intact 10. MSK: Normal range of motion    Chart has been reviewed  ______________________________________________________________________________________________  Assessment/Plan  ***  Admitted for   Seizure-like activity (HCC)    Present on Admission: **None**     No problem-specific Assessment & Plan notes found for this encounter.   Other plan as per orders.  DVT prophylaxis:  SCD    Code Status:    Code Status: Prior FULL CODE   as per patient ***family  I had personally discussed CODE STATUS with patient   ACP   none   Family Communication:   Family not at  Bedside  plan of care was discussed on the phone with *** Son, Daughter, Wife, Husband, Sister, Brother , father, mother  Diet  Diet Orders (From admission, onward)     Start     Ordered   02/12/24 1621  Diet NPO time specified  Diet effective now        02/12/24 1622            Disposition Plan:   *** likely will need placement for rehabilitation                          Back to current facility when stable                            To home once workup is complete and patient is stable  ***Following barriers for discharge:                             Chest pain *** Stroke *** work up is complete                             Electrolytes corrected  Anemia corrected h/H stable                             Pain controlled with PO medications                               Afebrile, white count improving able to transition to PO antibiotics                             Will need to be able to tolerate PO                            Will likely need home health, home O2, set up                           Will need consultants to evaluate patient prior to discharge       Consult Orders  (From admission, onward)           Start     Ordered   02/12/24 2306  Consult to hospitalist  Once       Provider:  (Not yet assigned)  Question Answer Comment  Place call to: [facility]   Reason for Consult Admit      02/12/24 2305                              ***Would benefit from PT/OT eval prior to DC  Ordered                   Swallow eval - SLP ordered                   Diabetes care coordinator                   Transition of care consulted                   Nutrition    consulted                  Wound care  consulted                   Palliative care    consulted                   Behavioral health  consulted                    Consults called: ***     Admission status:  ED Disposition     ED Disposition  Admit   Condition  --   Comment  The patient appears reasonably stabilized for admission considering the current resources, flow, and capabilities available in the ED at this time, and I doubt any other [facility] requiring further screening and/or treatment in the ED prior to admission is  present.           Obs***  ***  inpatient     I Expect 2 midnight stay secondary to severity of patient's current illness need for inpatient interventions justified by the following: ***hemodynamic instability despite optimal treatment (tachycardia *hypotension * tachypnea *hypoxia, hypercapnia) * Severe lab/radiological/exam abnormalities including:     and  extensive comorbidities including: *substance abuse  *Chronic pain *DM2  * CHF * CAD  * COPD/asthma *Morbid Obesity * CKD *dementia *liver disease *history of stroke with residual deficits *  malignancy, * sickle cell disease  History of amputation Chronic anticoagulation  That are currently affecting medical management.   I expect  patient to be hospitalized for 2 midnights requiring inpatient medical care.  Patient is at high risk for adverse outcome (such as loss of life or disability) if not treated.  Indication for inpatient stay as follows:  Severe change from baseline regarding mental status Hemodynamic instability despite maximal medical therapy,  ongoing suicidal ideations,  severe pain requiring acute inpatient management,  inability to maintain oral hydration   persistent chest pain despite medical management Need for operative/procedural  intervention New or worsening hypoxia   Need for IV antibiotics, IV fluids, IV rate controling medications, IV antihypertensives, IV pain medications, IV anticoagulation, need for biPAP    Level of care   *** tele  For 12H 24H     medical floor       progressive     stepdown   tele indefinitely please discontinue once patient no longer qualifies COVID-19 Labs    Lab Results  Component Value Date   SARSCOV2NAA NEGATIVE 10/15/2023     Precautions: admitted as *** Covid Negative  ***asymptomatic screening protocol****PUI *** covid positive No active isolations ***If Covid PCR is negative  - please DC precautions - would need additional investigation given very high risk for false native test result    Critical***  Patient is critically ill due to  hemodynamic instability * respiratory failure *severe sepsis* ongoing chest pain*  They are at high risk for life/limb threatening clinical deterioration requiring frequent reassessment and modifications of care.  Services provided include examination of the patient, review  of relevant ancillary tests, prescription of lifesaving therapies, review of medications and prophylactic therapy.  Total critical care time excluding separately billable procedures: 60*  Minutes.    Rudolfo Brandow 02/12/2024, 11:46 PM ***  [facility]     after 2 AM please page floor coverage PA If 7AM-7PM, please contact the day team taking care of the patient using [email or web address]

## 2024-02-12 NOTE — Subjective & Objective (Signed)
 Was brought in from home with witnessed seizures unclear duration Patient has never been treated for seizures Received 5 mg of Versed Had another episode in route but stopped when was addressed verbally Blood pressure 122/76 heart rate 112 no postictal state Episodes were witnessed by husband Multiple episodes of full body shaking Patient was supposed to follow-up as it outpatient neurology as she had had in the past similar presentations No prior history EEG

## 2024-02-12 NOTE — ED Triage Notes (Signed)
 Pt bib ems from home; multiple witnessed seizures, unknown duration; hx of same, no meds, not being treated for seizures; 5 mg I'm versed given pta; responsive to verbal stimuli; had another seizure en route, stopped when verbally addressed; 20 ga lac; 122/76, HR 112, RR 22, cbg 141, sats 99% RA; pt not prescribed seizure meds; no reported post ictal period observed

## 2024-02-12 NOTE — Assessment & Plan Note (Signed)
 Albuterol as needed

## 2024-02-13 ENCOUNTER — Observation Stay (HOSPITAL_COMMUNITY): Payer: 59

## 2024-02-13 DIAGNOSIS — R569 Unspecified convulsions: Secondary | ICD-10-CM | POA: Diagnosis not present

## 2024-02-13 DIAGNOSIS — Z720 Tobacco use: Secondary | ICD-10-CM | POA: Diagnosis present

## 2024-02-13 LAB — COMPREHENSIVE METABOLIC PANEL
ALT: 21 U/L (ref 0–44)
AST: 33 U/L (ref 15–41)
Albumin: 3.6 g/dL (ref 3.5–5.0)
Alkaline Phosphatase: 50 U/L (ref 38–126)
Anion gap: 13 (ref 5–15)
BUN: 7 mg/dL (ref 6–20)
CO2: 22 mmol/L (ref 22–32)
Calcium: 8.6 mg/dL — ABNORMAL LOW (ref 8.9–10.3)
Chloride: 102 mmol/L (ref 98–111)
Creatinine, Ser: 0.91 mg/dL (ref 0.44–1.00)
GFR, Estimated: >60 mL/min (ref >60)
Glucose, Bld: 128 mg/dL — ABNORMAL HIGH (ref 70–99)
Potassium: 4.5 mmol/L (ref 3.5–5.1)
Sodium: 137 mmol/L (ref 135–145)
Total Bilirubin: 1 mg/dL (ref 0.0–1.2)
Total Protein: 7.2 g/dL (ref 6.5–8.1)

## 2024-02-13 LAB — CBG MONITORING, ED: Glucose-Capillary: 114 mg/dL — ABNORMAL HIGH (ref 70–99)

## 2024-02-13 LAB — HEPATIC FUNCTION PANEL
ALT: 21 U/L (ref 0–44)
AST: 32 U/L (ref 15–41)
Albumin: 3.4 g/dL — ABNORMAL LOW (ref 3.5–5.0)
Alkaline Phosphatase: 53 U/L (ref 38–126)
Bilirubin, Direct: 0.3 mg/dL — ABNORMAL HIGH (ref 0.0–0.2)
Indirect Bilirubin: 0.7 mg/dL (ref 0.3–0.9)
Total Bilirubin: 1 mg/dL (ref 0.0–1.2)
Total Protein: 7 g/dL (ref 6.5–8.1)

## 2024-02-13 LAB — CBC
HCT: 38.9 % (ref 36.0–46.0)
Hemoglobin: 12.3 g/dL (ref 12.0–15.0)
MCH: 26.9 pg (ref 26.0–34.0)
MCHC: 31.6 g/dL (ref 30.0–36.0)
MCV: 84.9 fL (ref 80.0–100.0)
Platelets: 353 10*3/uL (ref 150–400)
RBC: 4.58 MIL/uL (ref 3.87–5.11)
RDW: 14.1 % (ref 11.5–15.5)
WBC: 11.2 10*3/uL — ABNORMAL HIGH (ref 4.0–10.5)
nRBC: 0 % (ref 0.0–0.2)

## 2024-02-13 LAB — URINALYSIS, COMPLETE (UACMP) WITH MICROSCOPIC
Bacteria, UA: NONE SEEN
Bilirubin Urine: NEGATIVE
Glucose, UA: NEGATIVE mg/dL
Hgb urine dipstick: NEGATIVE
Ketones, ur: NEGATIVE mg/dL
Leukocytes,Ua: NEGATIVE
Nitrite: NEGATIVE
Protein, ur: NEGATIVE mg/dL
Specific Gravity, Urine: 1.016 (ref 1.005–1.030)
pH: 5 (ref 5.0–8.0)

## 2024-02-13 LAB — RAPID URINE DRUG SCREEN, HOSP PERFORMED
Amphetamines: NOT DETECTED
Barbiturates: NOT DETECTED
Benzodiazepines: POSITIVE — AB
Cocaine: NOT DETECTED
Opiates: NOT DETECTED
Tetrahydrocannabinol: POSITIVE — AB

## 2024-02-13 LAB — TSH: TSH: 2.233 u[IU]/mL (ref 0.350–4.500)

## 2024-02-13 LAB — HIV ANTIBODY (ROUTINE TESTING W REFLEX): HIV Screen 4th Generation wRfx: NONREACTIVE

## 2024-02-13 LAB — MAGNESIUM: Magnesium: 1.9 mg/dL (ref 1.7–2.4)

## 2024-02-13 LAB — HEMOGLOBIN A1C
Hgb A1c MFr Bld: 6.1 % — ABNORMAL HIGH (ref 4.8–5.6)
Mean Plasma Glucose: 128.37 mg/dL

## 2024-02-13 LAB — PHOSPHORUS: Phosphorus: 2.3 mg/dL — ABNORMAL LOW (ref 2.5–4.6)

## 2024-02-13 MED ORDER — HYDROCODONE-ACETAMINOPHEN 5-325 MG PO TABS: 1.0000 | ORAL_TABLET | ORAL | Status: DC | PRN

## 2024-02-13 MED ORDER — ONDANSETRON HCL 4 MG/2ML IJ SOLN
4.0000 mg | Freq: Four times a day (QID) | INTRAMUSCULAR | Status: DC | PRN
  Administered 2024-02-13: 4 mg via INTRAVENOUS
  Filled 2024-02-13: qty 2

## 2024-02-13 MED ORDER — SODIUM CHLORIDE 0.9 % IV SOLN: INTRAVENOUS | Status: DC

## 2024-02-13 MED ORDER — ACETAMINOPHEN 650 MG RE SUPP: 650.0000 mg | Freq: Four times a day (QID) | RECTAL | Status: DC | PRN

## 2024-02-13 MED ORDER — NICOTINE 21 MG/24HR TD PT24: 21.0000 mg | MEDICATED_PATCH | Freq: Every day | TRANSDERMAL | Status: DC

## 2024-02-13 MED ORDER — ONDANSETRON HCL 4 MG PO TABS: 4.0000 mg | ORAL_TABLET | Freq: Four times a day (QID) | ORAL | Status: DC | PRN

## 2024-02-13 MED ORDER — ACETAMINOPHEN 325 MG PO TABS
650.0000 mg | ORAL_TABLET | Freq: Four times a day (QID) | ORAL | Status: DC | PRN
  Administered 2024-02-13: 650 mg via ORAL
  Filled 2024-02-13: qty 2

## 2024-02-13 MED ORDER — ALBUTEROL SULFATE (2.5 MG/3ML) 0.083% IN NEBU: 2.5000 mg | INHALATION_SOLUTION | RESPIRATORY_TRACT | Status: DC | PRN

## 2024-02-13 MED ORDER — INSULIN ASPART 100 UNIT/ML IJ SOLN: 0.0000 [IU] | INTRAMUSCULAR | Status: DC

## 2024-02-13 MED ORDER — ATORVASTATIN CALCIUM 10 MG PO TABS
20.0000 mg | ORAL_TABLET | Freq: Every day | ORAL | Status: DC
  Administered 2024-02-13: 20 mg via ORAL
  Filled 2024-02-13: qty 2

## 2024-02-13 NOTE — Consult Note (Addendum)
 NEUROLOGY CONSULT NOTE   Date of service: February 13, 2024 Patient Name: Holly Wagner MRN:  [id number] DOB:  April 10, 1991 Chief Complaint: "seizure like episodes" Requesting Provider: Therisa Doyne, MD  History of Present Illness  Holly Wagner is a 33 y.o. female with hx of Bipolar 1, ADD, GERD, prediabetes who for the last year and half, has been having seizure like episodes.  Gets an aura of either brain fog, pain in her chest or flickering and lights appearing weird. She then has jerking that starts either in her head  or in her arms and then goes into her legs. This can go on for 2 mins to 10 mins and then she is sleepy for the rest of the day.  She thinks flashing lights can trigger her spells. Nothing helps with these.  She has been seen several times in the ED over the last 6 months. She has not been able to get in with a neurologist.  She does not drink EtOH, denies any head injury with LOC. No hx of stroke, ICH, seizures. No family hx of seizures. No hx of meningitis or brain surgery or tumor.  She had 4-5 seizure like spells today. She was going in an out of them.  She endorses a lot of financial stress that she has been going under.   ROS  Comprehensive ROS performed and pertinent positives documented in HPI   Past History   Past Medical History:  Diagnosis Date   ADD (attention deficit disorder)    Anxiety and depression    Asthma    Bipolar 1 disorder (HCC)    Dumping syndrome    GERD (gastroesophageal reflux disease)    Mental disorder    PONV (postoperative nausea and vomiting)    Pre-diabetes     Past Surgical History:  Procedure Laterality Date   BILATERAL SALPINGECTOMY Bilateral 04/13/2021   Procedure: BILATERAL SALPINGECTOMY;  Surgeon: Schermerhorn, Ihor Austin, MD;  Location: [facility];  Service: Gynecology;  Laterality: Bilateral;   CHOLECYSTECTOMY  2007   fatty tumor removal  1992   IUD REMOVAL N/A 04/13/2021   Procedure: INTRAUTERINE  DEVICE (IUD) REMOVAL;  Surgeon: Schermerhorn, Ihor Austin, MD;  Location: [facility];  Service: Gynecology;  Laterality: N/A;   SINUS SURGERY WITH INSTATRAK     unsure, maybe 4 years ago   VAGINAL HYSTERECTOMY N/A 04/13/2021   Procedure: HYSTERECTOMY VAGINAL;  Surgeon: Schermerhorn, Ihor Austin, MD;  Location: [facility];  Service: Gynecology;  Laterality: N/A;   WISDOM TOOTH EXTRACTION      Family History: Family History  Problem Relation Age of Onset   Cancer Other     Social History  reports that she quit smoking about 8 years ago. Her smoking use included cigarettes. She has never used smokeless tobacco. She reports current alcohol use. She reports current drug use. Drug: Marijuana.  Allergies  Allergen Reactions   Morphine And Codeine Shortness Of Breath and Anxiety   Tizanidine Other (See Comments)    Psychosis  Other Reaction(s): Hallucination, Mental Status Changes, Other (See Comments), Psychosis  Crying, "put me in a bad mental place"  Crying, "put me in a bad mental place"    Psychosis   Prednisone Other (See Comments)    "Very emotional. Can't stop crying."  Other Reaction(s): Other (See Comments)  "Very emotional. Can't stop crying." Reports it mainly happens with the taper down pack, not with low dose steroid use.    Medications   Current Facility-Administered Medications:  insulin aspart (novoLOG) injection 0-9 Units, 0-9 Units, Subcutaneous, Q4H, Doutova, Anastassia, MD  Current Outpatient Medications:    Accu-Chek Softclix Lancets lancets, , Disp: , Rfl:    acetaminophen (TYLENOL) 325 MG tablet, Take 325 mg by mouth every 6 (six) hours as needed for mild pain (pain score 1-3), moderate pain (pain score 4-6), fever or headache., Disp: , Rfl:    albuterol (VENTOLIN HFA) 108 (90 Base) MCG/ACT inhaler, Inhale 2 puffs into the lungs every 6 (six) hours as needed for wheezing or shortness of breath., Disp: 8 g, Rfl: 0   aluminum chloride (DRYSOL) 20 % external  solution, Apply 1 Application topically at bedtime.  APPLY TOPICALLY AT BEDTIME APPLY SOLUTION SPARINGLY., Disp: , Rfl:    amoxicillin (AMOXIL) 500 MG capsule, Take 1 capsule (500 mg total) by mouth 3 (three) times daily., Disp: 30 capsule, Rfl: 0   atorvastatin (LIPITOR) 20 MG tablet, Take 1 tablet by mouth at bedtime., Disp: , Rfl:    colestipol (COLESTID) 1 g tablet, Take 2 g by mouth at bedtime., Disp: , Rfl:    Cysteamine Bitartrate (PROCYSBI) 300 MG PACK, See admin instructions., Disp: , Rfl:    dicyclomine (BENTYL) 20 MG tablet, Take 1 tablet (20 mg total) by mouth 2 (two) times daily., Disp: 20 tablet, Rfl: 0   Dulaglutide 0.75 MG/0.5ML SOAJ, Inject 0.75 mg into the skin once a week., Disp: , Rfl:    DULoxetine (CYMBALTA) 20 MG capsule, Take 20 mg by mouth at bedtime., Disp: , Rfl:    DULoxetine (CYMBALTA) 60 MG capsule, Take 1 capsule by mouth daily., Disp: , Rfl:    fluticasone (FLONASE) 50 MCG/ACT nasal spray, Place 1 spray into both nostrils daily as needed for allergies., Disp: , Rfl:    folic acid (FOLVITE) 400 MCG tablet, Take 400 mcg by mouth daily., Disp: , Rfl:    FOLIC ACID PO, Take 1 tablet by mouth daily., Disp: , Rfl:    ibuprofen (ADVIL) 400 MG tablet, Take 400 mg by mouth every 6 (six) hours as needed., Disp: , Rfl:    ketoconazole (NIZORAL) 2 % shampoo, Apply 1 Application topically 2 (two) times a week., Disp: , Rfl:    lisinopril (ZESTRIL) 2.5 MG tablet, Take 2.5 mg by mouth daily., Disp: , Rfl:    loratadine (CLARITIN) 10 MG tablet, Take 10 mg by mouth daily., Disp: , Rfl:    metFORMIN (GLUCOPHAGE) 500 MG tablet, Take 500 mg by mouth 2 (two) times daily., Disp: , Rfl:    omeprazole (PRILOSEC) 40 MG capsule, Take 40 mg by mouth at bedtime., Disp: , Rfl:    oxyCODONE (OXY IR/ROXICODONE) 5 MG immediate release tablet, Take 5 mg by mouth every 6 (six) hours as needed., Disp: , Rfl:    phenazopyridine (PYRIDIUM) 200 MG tablet, Take 200 mg by mouth 3 (three) times daily as  needed for pain., Disp: , Rfl:    polyethylene glycol (MIRALAX) 17 g packet, Take one cap full/package twice daily until BMs are soft and regular, then 1-2 x daily as needed for constipation., Disp: 30 each, Rfl: 0   PRECISION QID TEST test strip, 1 each as needed., Disp: , Rfl:    prednisoLONE acetate (PRED FORTE) 1 % ophthalmic suspension, 5 drops as needed (USE 5 DROPS IN BOTH EARS DAILY AS NEEDED FOR ITCHING)., Disp: , Rfl:    prochlorperazine (COMPAZINE) 5 MG tablet, Take 5 mg by mouth every 6 (six) hours as needed for nausea/vomiting., Disp: , Rfl:  traZODone (DESYREL) 50 MG tablet, Take 50 mg by mouth at bedtime., Disp: , Rfl:    valsartan (DIOVAN) 80 MG tablet, Take 1 tablet by mouth daily., Disp: , Rfl:    WIXELA INHUB 100-50 MCG/ACT AEPB, Inhale 1 puff into the lungs 2 (two) times daily., Disp: , Rfl:   Vitals   Vitals:   14-Feb-2024 1618 02-14-24 1815 02/14/2024 2101 2024-02-14 2219  BP:  106/85  114/74  Pulse:  94  89  Resp:  14  12  Temp:   98.7 F (37.1 C)   SpO2:  95%  98%  Weight: 122.5 kg     Height: 5\' 6"  (1.676 m)       Body mass index is 43.58 kg/m.  Physical Exam   General: Laying comfortably in bed; in no acute distress.  HENT: Normal oropharynx and mucosa. Normal external appearance of ears and nose.  Neck: Supple, no pain or tenderness  CV: No JVD. No peripheral edema.  Pulmonary: Symmetric Chest rise. Normal respiratory effort.  Abdomen: Soft to touch, non-tender.  Ext: No cyanosis, edema, or deformity  Skin: No rash. Normal palpation of skin.   Musculoskeletal: Normal digits and nails by inspection. No clubbing.   Neurologic Examination  Mental status/Cognition: Alert, oriented to self, place, month and year, good attention.  Speech/language: Fluent, comprehension intact, object naming intact, repetition intact.  Cranial nerves:   CN II Pupils equal and reactive to light, no VF deficits    CN III,IV,VI EOM intact, no gaze preference or deviation, no  nystagmus    CN V normal sensation in V1, V2, and V3 segments bilaterally    CN VII no asymmetry, no nasolabial fold flattening    CN VIII normal hearing to speech    CN IX & X normal palatal elevation, no uvular deviation    CN XI 5/5 head turn and 5/5 shoulder shrug bilaterally    CN XII midline tongue protrusion    Motor:  Muscle bulk: normal, tone normal, pronator drift none tremor none Mvmt Root Nerve  Muscle Right Left Comments  SA C5/6 Ax Deltoid 5 5   EF C5/6 Mc Biceps 5 5   EE C6/7/8 Rad Triceps 5 5   WF C6/7 Med FCR     WE C7/8 PIN ECU     F Ab C8/T1 U ADM/FDI 5 5   HF L1/2/3 Fem Illopsoas 5 5   KE L2/3/4 Fem Quad 5 5   DF L4/5 D Peron Tib Ant 5 5   PF S1/2 Tibial Grc/Sol 5 5    Sensation:  Light touch Intact throughout   Pin prick    Temperature    Vibration   Proprioception    Coordination/Complex Motor:  - Finger to Nose intact BL - Heel to shin intact BL - Rapid alternating movement are normal - Gait: deferred. Labs/Imaging/Neurodiagnostic studies   CBC:  Recent Labs  Lab 02-14-24 1733  WBC 9.6  NEUTROABS 7.1  HGB 11.7*  HCT 36.4  MCV 85.0  PLT 348   Basic Metabolic Panel:  Lab Results  Component Value Date   NA 137 02-14-24   K 3.7 02-14-24   CO2 25 02-14-24   GLUCOSE 124 (H) February 14, 2024   BUN 6 February 14, 2024   CREATININE 0.91 02-14-2024   CALCIUM 8.4 (L) Feb 14, 2024   GFRNONAA >60 02/14/24   GFRAA >60 08/07/2018   Lipid Panel:  Lab Results  Component Value Date   LDLCALC 76 01/28/2014   HgbA1c: No results  found for: "HGBA1C" Urine Drug Screen:     Component Value Date/Time   LABOPIA NONE DETECTED 04/13/2021 0610   COCAINSCRNUR NONE DETECTED 04/13/2021 0610   LABBENZ POSITIVE (A) 04/13/2021 0610   AMPHETMU NONE DETECTED 04/13/2021 0610   THCU NONE DETECTED 04/13/2021 0610   LABBARB NONE DETECTED 04/13/2021 0610    Alcohol Level     Component Value Date/Time   ETH <10 10/06/2023 0150   INR No results found for:  "INR" APTT No results found for: "APTT" AED levels: No results found for: "PHENYTOIN", "ZONISAMIDE", "LAMOTRIGINE", "LEVETIRACETA"  CT Head without contrast(Personally reviewed): CTH was negative for a large hypodensity concerning for a large territory infarct or hyperdensity concerning for an ICH  Neurodiagnostics cEEG:  pending  ASSESSMENT   EVADNA DONAGHY is a 33 y.o. female with hx of Bipolar 1, ADD, GERD, prediabetes who for the last year and half, has been having seizure like episodes with multiple ED visits and has not seen neurology outpatient yet.  Plan is to put her up on cEEG for spell capture with photic stimulation.   Suspect episodes are non epileptic.  If the EEG captures an epileptic seizure or has epileptiform discharges, will get further workup with MRI Brain.  RECOMMENDATIONS  - cEEG. ______________________________________________________________________    Welton Flakes, MD [facility]

## 2024-02-13 NOTE — Assessment & Plan Note (Signed)
 Contributing to comorbidity and complicating medical management  Body mass index is 43.58 kg/m.  Nutritional follow up as an out pt would be recommended

## 2024-02-13 NOTE — Procedures (Addendum)
 Patient Name: Holly Wagner  MRN: [id number]  Epilepsy Attending: Charlsie Quest  Referring Physician/Provider: Erick Blinks, MD  Duration: 02/12/2024 2313 to 02/13/2024 1008  Patient history: 33 y.o. female with hx of Bipolar 1, ADD, GERD, prediabetes who for the last year and half, has been having seizure like episodes with multiple ED visits and has not seen neurology outpatient yet. EEG to evaluate for seizure  Level of alertness: Awake, asleep  AEDs during EEG study: None  Technical aspects: This EEG study was done with scalp electrodes positioned according to the 10-20 International system of electrode placement. Electrical activity was reviewed with band pass filter of 1-70Hz , sensitivity of 7 uV/mm, display speed of 40mm/sec with a 60Hz  notched filter applied as appropriate. EEG data were recorded continuously and digitally stored.  Video monitoring was available and reviewed as appropriate.  Description: The posterior dominant rhythm consists of 9 Hz activity of moderate voltage (25-35 uV) seen predominantly in posterior head regions, symmetric and reactive to eye opening and eye closing. Sleep was characterized by vertex waves, sleep spindles (12 to 14 Hz), maximal frontocentral region. Physiologic photic driving was not seen during photic stimulation.  Hyperventilation was not performed  One event was recorded on 02/12/2024 at 2321.  Patient was undergoing photic stimulation and stopped responding, her eyes rolled back.  She was able to squeeze examiner's hands.  Concomitant EEG before, during and after the event showed normal posterior dominant rhythm and did not show any EEG change to suggest seizure.  After about 1 minute, patient opened her eyes and reported feeling "hot and overheated".  She was also noted to have twitching in legs and arms without concomitant EEG change.  One event was recorded on 02/13/2024 at 2323.  Patient again stopped responding.  Concomitant EEG  before, during and after the event showed normal posterior dominant rhythm and did not show any EEG changes with a seizure.  IMPRESSION: This study is within normal limits.  No seizures or epileptiform discharges were noted during the study.  Two events were recorded as described above during which patient became unresponsive, eyes appeared to roll back.  Concomitant EEG did not show any EEG change to suggest seizure. These were nonepileptic events.  A normal interictal EEG does not exclude the diagnosis of epilepsy.   Ariyon Mittleman Annabelle Harman

## 2024-02-13 NOTE — Progress Notes (Signed)
 LTM EEG discontinued - no skin breakdown at [facility].

## 2024-02-13 NOTE — Assessment & Plan Note (Signed)
 Order sliding scale hold p.o. medications

## 2024-02-13 NOTE — ED Notes (Signed)
 Pt understood d/c instructions and follow up appointments. Pt had no further questions at this time. Pt and husband were told to wait in the lobby where the hospital will provide them with a bus pass

## 2024-02-13 NOTE — Discharge Summary (Signed)
 Physician Discharge Summary  Holly Wagner QMV:[id number] DOB: [birth year] DOA: 02/12/2024  PCP: Thomasenia Sales, Shaune Leeks, FNP  Admit date: 02/12/2024  Discharge date: 02/13/2024  Admitted From: Home.  Disposition:  Home.  Recommendations for Outpatient Follow-up:  Follow up with PCP in 1-2 weeks. Please obtain BMP/CBC in one week. Advised to follow-up with psychiatry to establish care. Advised to refrain from driving since these episodes can make her unconscious. Patient has nonepileptic seizures.  Home Health:None Equipment/Devices:None  Discharge Condition: Stable CODE STATUS:Full code Diet recommendation: Heart Healthy  Brief [facility] Course: This 33 y.o. female with medical history significant of DM 2,  asthma, seizure-like activity presented with multiple episodes of seizure-like activity, no postictal state. She was brought in from home with witnessed seizures,  unclear duration by her family. Patient has never been treated for seizures, she had received Versed when EMS arrived. She had another episode in route but stopped when was addressed verbally.  Episodes were witnessed by husband.  He describes as having her multiple episodes of body shaking.  Patient was supposed to follow-up with outpatient Neurology as she has had these episodes in the past. She never had EEG. She does have history of bipolar, anxiety disorder.  She reports some times hear people addressing her in the middle of episode.  Patient was admitted for further evaluation.  EEG was completed.  Patient did have 2 episodes of similar condition appears nonepileptic.  Neurology recommended psych evaluation.  Patient states she has outpatient follow-up appointment scheduled to establish care and she wants to follow-up outpatient.  She feels better and wants to be discharged, Neurology signed off.  Patient being discharged home,  advised to refrain from driving since these episodes can make her unconscious.   Patient is being discharged home.   Discharge Diagnoses:  Principal Problem:   Seizure-like activity (HCC) Active Problems:   Asthma   Controlled type 2 diabetes mellitus without complication, without long-term current use of insulin (HCC)   Essential hypertension   Morbid obesity (HCC)   Tobacco abuse    Discharge Instructions  Discharge Instructions     Call MD for:  difficulty breathing, headache or visual disturbances   Complete by: As directed    Call MD for:  persistant dizziness or light-headedness   Complete by: As directed    Call MD for:  persistant nausea and vomiting   Complete by: As directed    Diet - low sodium heart healthy   Complete by: As directed    Diet Carb Modified   Complete by: As directed    Discharge instructions   Complete by: As directed    Advised to follow-up with primary care physician in 1 week. Advised to follow-up with psychiatry to establish care. Advised to refrain from driving since these episodes can make her unconscious. Patient has nonepileptic seizures   Increase activity slowly   Complete by: As directed       Allergies as of 02/13/2024       Reactions   Morphine And Codeine Shortness Of Breath, Anxiety   Tizanidine Other (See Comments)   Psychosis Other Reaction(s): Hallucination, Mental Status Changes, Other (See Comments), Psychosis Crying, "put me in a bad mental place" Crying, "put me in a bad mental place"    Psychosis   Prednisone Other (See Comments)   "Very emotional. Can't stop crying." Other Reaction(s): Other (See Comments) "Very emotional. Can't stop crying." Reports it mainly happens with the taper down pack, not  with low dose steroid use.        Medication List     STOP taking these medications    lisinopril 2.5 MG tablet Commonly known as: ZESTRIL       TAKE these medications    Accu-Chek Softclix Lancets lancets   albuterol 108 (90 Base) MCG/ACT inhaler Commonly known as: VENTOLIN  HFA Inhale 2 puffs into the lungs every 6 (six) hours as needed for wheezing or shortness of breath.   atorvastatin 20 MG tablet Commonly known as: LIPITOR Take 1 tablet by mouth at bedtime.   colestipol 1 g tablet Commonly known as: COLESTID Take 1 g by mouth at bedtime.   DULoxetine 60 MG capsule Commonly known as: CYMBALTA Take 1 capsule by mouth daily.   fluticasone 50 MCG/ACT nasal spray Commonly known as: FLONASE Place 1 spray into both nostrils daily as needed for allergies.   folic acid 400 MCG tablet Commonly known as: FOLVITE Take 400 mcg by mouth daily.   metFORMIN 500 MG tablet Commonly known as: GLUCOPHAGE Take 500 mg by mouth 2 (two) times daily.   Precision QID Test test strip Generic drug: glucose blood 1 each as needed.   traZODone 50 MG tablet Commonly known as: DESYREL Take 50 mg by mouth at bedtime.   valsartan 80 MG tablet Commonly known as: DIOVAN Take 1 tablet by mouth daily.        Follow-up Information     Thomasenia Sales, Shaune Leeks, FNP Follow up in 1 week(s).   Specialty: Family Medicine Contact information: [address] [place] [address] [phone]                Allergies  Allergen Reactions   Morphine And Codeine Shortness Of Breath and Anxiety   Tizanidine Other (See Comments)    Psychosis  Other Reaction(s): Hallucination, Mental Status Changes, Other (See Comments), Psychosis  Crying, "put me in a bad mental place"  Crying, "put me in a bad mental place"    Psychosis   Prednisone Other (See Comments)    "Very emotional. Can't stop crying."  Other Reaction(s): Other (See Comments)  "Very emotional. Can't stop crying." Reports it mainly happens with the taper down pack, not with low dose steroid use.    Consultations: Neurology   Procedures/Studies: Overnight EEG with video Result Date: 02/13/2024 Charlsie Quest, MD     02/13/2024  9:06 AM Patient Name: Holly Wagner MRN:  [id number] Epilepsy Attending: Charlsie Quest Referring Physician/Provider: Erick Blinks, MD Duration: 02/12/2024 2313 to 02/13/2024 0900 Patient history: 33 y.o. female with hx of Bipolar 1, ADD, GERD, prediabetes who for the last year and half, has been having seizure like episodes with multiple ED visits and has not seen neurology outpatient yet. EEG to evaluate for seizure Level of alertness: Awake, asleep AEDs during EEG study: None Technical aspects: This EEG study was done with scalp electrodes positioned according to the 10-20 International system of electrode placement. Electrical activity was reviewed with band pass filter of 1-70Hz , sensitivity of 7 uV/mm, display speed of 57mm/sec with a 60Hz  notched filter applied as appropriate. EEG data were recorded continuously and digitally stored.  Video monitoring was available and reviewed as appropriate. Description: The posterior dominant rhythm consists of 9 Hz activity of moderate voltage (25-35 uV) seen predominantly in posterior head regions, symmetric and reactive to eye opening and eye closing. Sleep was characterized by vertex waves, sleep spindles (12 to 14 Hz), maximal  frontocentral region. Physiologic photic driving was not seen during photic stimulation.  Hyperventilation was not performed One event was recorded on 02/12/2024 at 2321.  Patient was undergoing photic stimulation and stopped responding, her eyes rolled back.  She was able to squeeze examiner's hands.  Concomitant EEG before, during and after the event showed normal posterior dominant rhythm and did not show any EEG change to suggest seizure.  After about 1 minute, patient opened her eyes and reported feeling "hot and overheated".  She was also noted to have twitching in legs and arms without concomitant EEG change. One event was recorded on 02/13/2024 at 2323.  Patient again stopped responding.  Concomitant EEG before, during and after the event showed normal posterior dominant  rhythm and did not show any EEG changes with a seizure. IMPRESSION: This study is within normal limits.  No seizures or epileptiform discharges were noted during the study. Two events were recorded as described above during which patient became unresponsive, eyes appeared to roll back.  Concomitant EEG did not show any EEG change to suggest seizure. These were nonepileptic events. A normal interictal EEG does not exclude the diagnosis of epilepsy. Charlsie Quest   CT Head Wo Contrast Result Date: 02/12/2024 CLINICAL DATA:  Seizure disorder EXAM: CT HEAD WITHOUT CONTRAST TECHNIQUE: Contiguous axial images were obtained from the base of the skull through the vertex without intravenous contrast. RADIATION DOSE REDUCTION: This exam was performed according to the departmental dose-optimization program which includes automated exposure control, adjustment of the mA and/or kV according to patient size and/or use of iterative reconstruction technique. COMPARISON:  10/06/2023 FINDINGS: Brain: No evidence of acute infarction, hemorrhage, hydrocephalus, extra-axial collection or mass lesion/mass effect. Vascular: No hyperdense vessel or unexpected calcification. Skull: Normal. Negative for fracture or focal lesion. Sinuses/Orbits: No acute finding. Other: None. IMPRESSION: No acute intracranial abnormality noted. Electronically Signed   By: Alcide Clever M.D.   On: 02/12/2024 20:19      Subjective: Patient was seen and examined at bedside.  Overnight events noted.   Patient reports feeling much improved and wants to be discharged.  Discharge Exam: Vitals:   02/13/24 0826 02/13/24 1049  BP: 128/88 121/77  Pulse: 73 77  Resp: 20 17  Temp: 98.1 F (36.7 C) 98 F (36.7 C)  SpO2: 100% 99%   Vitals:   02/13/24 0215 02/13/24 0520 02/13/24 0826 02/13/24 1049  BP:  134/77 128/88 121/77  Pulse:  80 73 77  Resp:  16 20 17   Temp: 98.2 F (36.8 C) 98.3 F (36.8 C) 98.1 F (36.7 C) 98 F (36.7 C)  TempSrc:  Oral Oral Oral Oral  SpO2:  100% 100% 99%  Weight:      Height:        General: Pt is alert, awake, not in acute distress Cardiovascular: RRR, S1/S2 +, no rubs, no gallops Respiratory: CTA bilaterally, no wheezing, no rhonchi Abdominal: Soft, NT, ND, bowel sounds + Extremities: no edema, no cyanosis    The results of significant diagnostics from this hospitalization (including imaging, microbiology, ancillary and laboratory) are listed below for reference.     Microbiology: No results found for this or any previous visit (from the past 240 hours).   Labs: BNP (last 3 results) No results for input(s): "BNP" in the last 8760 hours. Basic Metabolic Panel: Recent Labs  Lab 02/12/24 1733 02/13/24 0440  NA 137 137  K 3.7 4.5  CL 101 102  CO2 25 22  GLUCOSE 124* 128*  BUN 6 7  CREATININE 0.91 0.91  CALCIUM 8.4* 8.6*  MG  --  1.9  PHOS  --  2.3*   Liver Function Tests: Recent Labs  Lab 02/13/24 0440  AST 32  33  ALT 21  21  ALKPHOS 53  50  BILITOT 1.0  1.0  PROT 7.0  7.2  ALBUMIN 3.4*  3.6   No results for input(s): "LIPASE", "AMYLASE" in the last 168 hours. No results for input(s): "AMMONIA" in the last 168 hours. CBC: Recent Labs  Lab 02/12/24 1733 02/13/24 0440  WBC 9.6 11.2*  NEUTROABS 7.1  --   HGB 11.7* 12.3  HCT 36.4 38.9  MCV 85.0 84.9  PLT 348 353   Cardiac Enzymes: Recent Labs  Lab 02/12/24 1733  CKTOTAL 84   BNP: Invalid input(s): "POCBNP" CBG: Recent Labs  Lab 02/12/24 1552 02/13/24 0138  GLUCAP 117* 114*   D-Dimer No results for input(s): "DDIMER" in the last 72 hours. Hgb A1c Recent Labs    02/13/24 0440  HGBA1C 6.1*   Lipid Profile No results for input(s): "CHOL", "HDL", "LDLCALC", "TRIG", "CHOLHDL", "LDLDIRECT" in the last 72 hours. Thyroid function studies Recent Labs    02/13/24 0440  TSH 2.233   Anemia work up No results for input(s): "VITAMINB12", "FOLATE", "FERRITIN", "TIBC", "IRON", "RETICCTPCT" in the  last 72 hours. Urinalysis    Component Value Date/Time   COLORURINE YELLOW 02/13/2024 0223   APPEARANCEUR CLEAR 02/13/2024 0223   APPEARANCEUR Clear 11/25/2014 2132   LABSPEC 1.016 02/13/2024 0223   LABSPEC 1.023 11/25/2014 2132   PHURINE 5.0 02/13/2024 0223   GLUCOSEU NEGATIVE 02/13/2024 0223   GLUCOSEU Negative 11/25/2014 2132   HGBUR NEGATIVE 02/13/2024 0223   BILIRUBINUR NEGATIVE 02/13/2024 0223   BILIRUBINUR Negative 11/25/2014 2132   KETONESUR NEGATIVE 02/13/2024 0223   PROTEINUR NEGATIVE 02/13/2024 0223   NITRITE NEGATIVE 02/13/2024 0223   LEUKOCYTESUR NEGATIVE 02/13/2024 0223   LEUKOCYTESUR Negative 11/25/2014 2132   Sepsis Labs Recent Labs  Lab 02/12/24 1733 02/13/24 0440  WBC 9.6 11.2*   Microbiology No results found for this or any previous visit (from the past 240 hours).   Time coordinating discharge: Over 30 minutes  SIGNED:   Willeen Niece, MD  [facility] 02/13/2024, 11:40 AM Pager   If 7PM-7AM, please contact night-coverage

## 2024-02-13 NOTE — Assessment & Plan Note (Signed)
 Appreciate neurology consult Seizure precautions EEG ordered Pending results may need father follow-up with neurology

## 2024-02-13 NOTE — Assessment & Plan Note (Signed)
 Allow permissive hypertension for tonight

## 2024-02-13 NOTE — Progress Notes (Signed)
 LTM maint complete - no skin breakdown under: FP2, GND. Pt had unplugged EEG headbox. Reconnected box and LTM running. Pt in ED and not monitored. [facility] already has her information was given by [id number] Tech

## 2024-02-13 NOTE — Assessment & Plan Note (Signed)
 -  Spoke about importance of quitting spent 5 minutes discussing options for treatment, prior attempts at quitting, and dangers of smoking ? -At this point patient is    NOT  interested in quitting ? - order nicotine patch  ? - nursing tobacco cessation protocol ? ?

## 2024-02-13 NOTE — Progress Notes (Signed)
 Subjective: Reported to difficult events overnight.  Per patient, these felt like her typical episodes.  Husband at bedside agrees.  ROS: negative except above  Examination  Vital signs in last 24 hours: Temp:  [98 F (36.7 C)-98.9 F (37.2 C)] 98.1 F (36.7 C) (02/28 0826) Pulse Rate:  [73-102] 73 (02/28 0826) Resp:  [12-20] 20 (02/28 0826) BP: (106-134)/(67-88) 128/88 (02/28 0826) SpO2:  [95 %-100 %] 100 % (02/28 0826) Weight:  [122.5 kg] 122.5 kg (02/27 1618)  General: Sitting in bed, NAD Neuro: MS: Alert, oriented, follows commands CN: pupils equal and reactive,  EOMI, face symmetric, tongue midline, normal sensation over face, Motor: 5/5 strength in all 4 extremities Coordination: normal Gait: not tested  Basic Metabolic Panel: Recent Labs  Lab 02/12/24 1733 02/13/24 0440  NA 137 137  K 3.7 4.5  CL 101 102  CO2 25 22  GLUCOSE 124* 128*  BUN 6 7  CREATININE 0.91 0.91  CALCIUM 8.4* 8.6*  MG  --  1.9  PHOS  --  2.3*    CBC: Recent Labs  Lab 02/12/24 1733 02/13/24 0440  WBC 9.6 11.2*  NEUTROABS 7.1  --   HGB 11.7* 12.3  HCT 36.4 38.9  MCV 85.0 84.9  PLT 348 353     Coagulation Studies: No results for input(s): "LABPROT", "INR" in the last 72 hours.  Imaging personally reviewed  CT head without contrast 02/12/2024: No acute abnormality.   ASSESSMENT AND PLAN: 32-old female with episodes of alteration of awareness underwent EEG for characterization of spells.   Nonepileptic spells -Video EEG reported 2 typical events which were confirmed nonepileptic. The diagnosis of nonepileptic spells was discussed with patient as well as husband at bedside.  Patient understands and agrees with the diagnosis. -Discussed the importance of follow-up with therapist and cognitive behavioral therapy.  Patient states she has had cognitive eval therapy before and really likes it.  She will follow-up with therapist.  -I also discussed seizure precautions including no  driving -DC LTM EEG -Plan discussed with Dr. Idelle Leech via secure chat  Seizure precautions: Per [facility] statutes, patients with seizures are not allowed to drive until they have been seizure-free for six months and cleared by a physician    Use caution when using heavy equipment or power tools. Avoid working on ladders or at heights. Take showers instead of baths. Ensure the water temperature is not too high on the home water heater. Do not go swimming alone. Do not lock yourself in a room alone (i.e. bathroom). When caring for infants or small children, sit down when holding, feeding, or changing them to minimize risk of injury to the child in the event you have a seizure. Maintain good sleep hygiene. Avoid alcohol.    If patient has another seizure, call 911 and bring them back to the ED if: A.  The seizure lasts longer than 5 minutes.      B.  The patient doesn't wake shortly after the seizure or has new problems such as difficulty seeing, speaking or moving following the seizure C.  The patient was injured during the seizure D.  The patient has a temperature over 102 F (39C) E.  The patient vomited during the seizure and now is having trouble breathing    During the Seizure   - First, ensure adequate ventilation and place patients on the floor on their left side  Loosen clothing around the neck and ensure the airway is patent. If the patient  is clenching the teeth, do not force the mouth open with any object as this can cause severe damage - Remove all items from the surrounding that can be hazardous. The patient may be oblivious to what's happening and may not even know what he or she is doing. If the patient is confused and wandering, either gently guide him/her away and block access to outside areas - Reassure the individual and be comforting - Call 911. In most cases, the seizure ends before EMS arrives. However, there are cases when seizures may last over 3 to 5 minutes. Or  the individual may have developed breathing difficulties or severe injuries. If a pregnant patient or a person with diabetes develops a seizure, it is prudent to call an ambulance.     After the Seizure (Postictal Stage)   After a seizure, most patients experience confusion, fatigue, muscle pain and/or a headache. Thus, one should permit the individual to sleep. For the next few days, reassurance is essential. Being calm and helping reorient the person is also of importance.   Most seizures are painless and end spontaneously. Seizures are not harmful to others but can lead to complications such as stress on the lungs, brain and the heart. Individuals with prior lung problems may develop labored breathing and respiratory distress.       Lindie Spruce Epilepsy [facility] For questions after 5pm please refer to AMION to reach the Neurologist on call

## 2024-02-13 NOTE — H&P (Incomplete)
 Holly Wagner EAV:[id number] DOB: 1991/07/07 DOA: 02/12/2024     PCP: Thomasenia Sales, Shaune Leeks, FNP   Outpatient Specialists:  NONE   Patient arrived to ER on 02/12/24 at 1549 Referred by Attending Franne Forts, DO   Patient coming from:    home Lives  With family    Chief Complaint:   Chief Complaint  Patient presents with  . Seizures    HPI: Holly Wagner is a 33 y.o. female with medical history significant of DM 2,  asthma, seizure-like activity     Presented with multiple episodes of seizure-like activity no postictal state Was brought in from home with witnessed seizures unclear duration Patient has never been treated for seizures Received 5 mg of Versed Had another episode in route but stopped when was addressed verbally Blood pressure 122/76 heart rate 112 no postictal state Episodes were witnessed by husband Multiple episodes of full body shaking Patient was supposed to follow-up as it outpatient neurology as she had had in the past similar presentations No prior history EEG    Denies significant ETOH intake *** Does not smoke*** but interested in quitting***  Lab Results  Component Value Date   SARSCOV2NAA NEGATIVE 10/15/2023   SARSCOV2NAA NEGATIVE 03/24/2023   SARSCOV2NAA NEGATIVE 04/11/2021        Regarding pertinent Chronic problems:    ****Hyperlipidemia - *on statins {statin:315258}  Lipid Panel     Component Value Date/Time   CHOL 118 01/28/2014 0537   TRIG 59 01/28/2014 0537   HDL 30 (L) 01/28/2014 0537   VLDL 12 01/28/2014 0537   LDLCALC 76 01/28/2014 0537    ***HTN on   ***chronic CHF diastolic/systolic/ combined - last echo*** No results found for this or any previous visit (from the past 78295 hours).  *** CAD  - On Aspirin, statin, betablocker, Plavix                 - *followed by cardiology                - last cardiac cath       ***DM 2 - No results found for: "HGBA1C" ****on insulin, PO meds only, diet  controlled  ***Hypothyroidism:   Lab Results  Component Value Date   TSH 1.728 08/07/2018   on synthroid  *** Morbid obesity-   BMI Readings from Last 1 Encounters:  02/12/24 43.58 kg/m     *** Asthma -well *** controlled on home inhalers/ nebs                     *** COPD - not **followed by pulmonology *** not  on baseline oxygen  *L,    *** OSA -on nocturnal oxygen, *CPAP, *noncompliant with CPAP  *** Hx of CVA - *with/out residual deficits on Aspirin 81 mg, 325, Plavix  ***A. Fib -   atrial fibrillation CHA2DS2 vas score **** CHA2DS2/VAS Stroke Risk Points      N/A >= 2 Points: High Risk  1 to 1.99 Points: Medium Risk  0 Points: Low Risk    Last Change: N/A      This score determines the patient's risk of having a stroke if the  patient has atrial fibrillation.      This score is not applicable to this patient. Components are not  calculated.     current  on anticoagulation with ****Coumadin  ***Xarelto,* Eliquis,  *** Not on anticoagulation secondary to Risk of Falls, ***  recurrent bleeding         -  Rate control:  Currently controlled with ***Toprolol,  *Metoprolol,* Diltiazem, *Coreg          - Rhythm control: *** amiodarone, *flecainide  ***Hx of DVT/PE on - anticoagulation with ****Coumadin  ***Xarelto,* Eliquis,     ***CKD stage III*-   baseline Cr **** Estimated Creatinine Clearance: 116.3 mL/min (by C-G formula based on SCr of 0.91 mg/dL).  Lab Results  Component Value Date   CREATININE 0.91 02/12/2024   CREATININE 0.98 11/02/2023   CREATININE 0.93 10/15/2023   Lab Results  Component Value Date   NA 137 02/12/2024   CL 101 02/12/2024   K 3.7 02/12/2024   CO2 25 02/12/2024   BUN 6 02/12/2024   CREATININE 0.91 02/12/2024   GFRNONAA >60 02/12/2024   CALCIUM 8.4 (L) 02/12/2024   ALBUMIN 3.8 10/06/2023   GLUCOSE 124 (H) 02/12/2024    **** Liver disease Computed MELD 3.0 unavailable. One or more values for this score either were not found  within the given timeframe or did not fit some other criterion. Computed MELD-Na unavailable. One or more values for this score either were not found within the given timeframe or did not fit some other criterion.  Hepatic Function Panel     Component Value Date/Time   PROT 6.8 10/06/2023 0150   PROT 7.4 05/31/2014 1007   ALBUMIN 3.8 10/06/2023 0150   ALBUMIN 3.6 05/31/2014 1007   AST 32 10/06/2023 0150   AST 31 05/31/2014 1007   ALT 33 10/06/2023 0150   ALT 32 05/31/2014 1007   ALKPHOS 62 10/06/2023 0150   ALKPHOS 65 05/31/2014 1007   BILITOT 0.2 (L) 10/06/2023 0150   BILITOT 0.9 05/31/2014 1007   No results found for requested labs within last 1095 days.  ***BPH - on Flomax, Proscar    *** Dementia - on Aricept** Nemenda  *** Chronic anemia - baseline hg Hemoglobin & Hematocrit  Recent Labs    10/15/23 0306 11/02/23 0255 02/12/24 1733  HGB 11.8* 11.9* 11.7*   Iron/TIBC/Ferritin/ %Sat No results found for: "IRON", "TIBC", "FERRITIN", "IRONPCTSAT"   Seizure DO - las seizure *** currently on      While in ER:   CT head nonacute     Lab Orders         CBC with Differential         Basic metabolic panel         Rapid urine drug screen (hospital performed)         CK         CBG monitoring, ED         I-Stat CG4 Lactic Acid      CT HEAD   NON acute     Following Medications were ordered in ER: Medications - No data to display  _______________________________________________________ ER Provider Called:       DrMarland Kitchen  They Recommend admit to medicine *** Will see in AM  ***SEEN in ER     ED Triage Vitals  Encounter Vitals Group     BP 02/12/24 1555 114/67     Systolic BP Percentile --      Diastolic BP Percentile --      Pulse Rate 02/12/24 1555 (!) 102     Resp 02/12/24 1555 18     Temp 02/12/24 1605 98.9 F (37.2 C)     Temp src --      SpO2 02/12/24 1555 98 %  Weight 02/12/24 1618 270 lb (122.5 kg)     Height 02/12/24 1618 5\' 6"  (1.676 m)      Head Circumference --      Peak Flow --      Pain Score 02/12/24 1618 0     Pain Loc --      Pain Education --      Exclude from Growth Chart --   WUJW(11)@     _________________________________________ Significant initial  Findings: Abnormal Labs Reviewed  CBC WITH DIFFERENTIAL/PLATELET - Abnormal; Notable for the following components:      Result Value   Hemoglobin 11.7 (*)    All other components within normal limits  BASIC METABOLIC PANEL - Abnormal; Notable for the following components:   Glucose, Bld 124 (*)    Calcium 8.4 (*)    All other components within normal limits  CBG MONITORING, ED - Abnormal; Notable for the following components:   Glucose-Capillary 117 (*)    All other components within normal limits    Cardiac Panel (last 3 results) Recent Labs    02/12/24 1733  CKTOTAL 84    ECG: Ordered Personally reviewed and interpreted by me showing: HR : 103 Rhythm: Sinus tachycardia Low voltage, precordial leads QTC 403  BNP (last 3 results) No results for input(s): "BNP" in the last 8760 hours.   COVID-19 Labs  No results for input(s): "DDIMER", "FERRITIN", "LDH", "CRP" in the last 72 hours.  Lab Results  Component Value Date   SARSCOV2NAA NEGATIVE 10/15/2023   SARSCOV2NAA NEGATIVE 03/24/2023   SARSCOV2NAA NEGATIVE 04/11/2021       The recent clinical data is shown below. Vitals:   02/12/24 1618 02/12/24 1815 02/12/24 2101 02/12/24 2219  BP:  106/85  114/74  Pulse:  94  89  Resp:  14  12  Temp:   98.7 F (37.1 C)   SpO2:  95%  98%  Weight: 122.5 kg     Height: 5\' 6"  (1.676 m)       WBC     Component Value Date/Time   WBC 9.6 02/12/2024 1733   LYMPHSABS 1.7 02/12/2024 1733   LYMPHSABS 3.6 11/25/2014 2135   MONOABS 0.6 02/12/2024 1733   MONOABS 0.9 11/25/2014 2135   EOSABS 0.0 02/12/2024 1733   EOSABS 0.1 11/25/2014 2135   BASOSABS 0.0 02/12/2024 1733   BASOSABS 0.1 11/25/2014 2135    Lactic Acid, Venous    Component Value  Date/Time   LATICACIDVEN 0.8 02/12/2024 1742       UA   ordered    Results for orders placed or performed during the hospital encounter of 10/29/23  Culture, group A strep     Status: None   Collection Time: 10/29/23  6:31 PM   Specimen: Throat  Result Value Ref Range Status   Specimen Description THROAT  Final   Special Requests NONE  Final   Culture   Final    NO GROUP A STREP (S.PYOGENES) ISOLATED Performed at [facility], [address] [place], [place] [address]    Report Status 11/02/2023 FINAL  Final      __________________________________________________________ Recent Labs  Lab 02/12/24 1733  NA 137  K 3.7  CO2 25  GLUCOSE 124*  BUN 6  CREATININE 0.91  CALCIUM 8.4*    Cr  stable,   Lab Results  Component Value Date   CREATININE 0.91 02/12/2024   CREATININE 0.98 11/02/2023   CREATININE 0.93 10/15/2023    No results for  input(s): "AST", "ALT", "ALKPHOS", "BILITOT", "PROT", "ALBUMIN" in the last 168 hours. Lab Results  Component Value Date   CALCIUM 8.4 (L) 02/12/2024    Plt: Lab Results  Component Value Date   PLT 348 02/12/2024       Recent Labs  Lab 02/12/24 1733  WBC 9.6  NEUTROABS 7.1  HGB 11.7*  HCT 36.4  MCV 85.0  PLT 348    HG/HCT  stable,     Component Value Date/Time   HGB 11.7 (L) 02/12/2024 1733   HGB 13.5 11/25/2014 2135   HCT 36.4 02/12/2024 1733   HCT 42.4 11/25/2014 2135   MCV 85.0 02/12/2024 1733   MCV 91 11/25/2014 2135    _______________________________________________ Hospitalist was called for admission for   Seizure-like activity   The following Work up has been ordered so far:  Orders Placed This Encounter  Procedures  . CT Head Wo Contrast  . CBC with Differential  . Basic metabolic panel  . Rapid urine drug screen (hospital performed)  . CK  . Diet NPO time specified  . ED Cardiac monitoring  . Consult to neurology  . Consult to hospitalist  . CBG monitoring, ED  . I-Stat CG4 Lactic Acid   . EKG 12-Lead  . Overnight EEG with video  . Seizure precautions     OTHER Significant initial  Findings:  labs showing:     DM  labs:  HbA1C: No results for input(s): "HGBA1C" in the last 8760 hours.     CBG (last 3)  Recent Labs    02/12/24 1552  GLUCAP 117*          Cultures:    Component Value Date/Time   SDES THROAT 10/29/2023 1831   SPECREQUEST NONE 10/29/2023 1831   CULT  10/29/2023 1831    NO GROUP A STREP (S.PYOGENES) ISOLATED Performed at [facility], [address] [place], [place] [address]    REPTSTATUS 11/02/2023 FINAL 10/29/2023 1831     Radiological Exams on Admission: CT Head Wo Contrast Result Date: 02/12/2024 CLINICAL DATA:  Seizure disorder EXAM: CT HEAD WITHOUT CONTRAST TECHNIQUE: Contiguous axial images were obtained from the base of the skull through the vertex without intravenous contrast. RADIATION DOSE REDUCTION: This exam was performed according to the departmental dose-optimization program which includes automated exposure control, adjustment of the mA and/or kV according to patient size and/or use of iterative reconstruction technique. COMPARISON:  10/06/2023 FINDINGS: Brain: No evidence of acute infarction, hemorrhage, hydrocephalus, extra-axial collection or mass lesion/mass effect. Vascular: No hyperdense vessel or unexpected calcification. Skull: Normal. Negative for fracture or focal lesion. Sinuses/Orbits: No acute finding. Other: None. IMPRESSION: No acute intracranial abnormality noted. Electronically Signed   By: Alcide Clever M.D.   On: 02/12/2024 20:19   _______________________________________________________________________________________________________ Latest  Blood pressure 114/74, pulse 89, temperature 98.7 F (37.1 C), resp. rate 12, height 5\' 6"  (1.676 m), weight 122.5 kg, last menstrual period 04/12/2021, SpO2 98%.   Vitals  labs and radiology finding personally reviewed  Review of Systems:    Pertinent positives  include:   no localizing neurological complaints  Constitutional:  No weight loss, night sweats, Fevers, chills, fatigue, weight loss  HEENT:  No headaches, Difficulty swallowing,Tooth/dental problems,Sore throat,  No sneezing, itching, ear ache, nasal congestion, post nasal drip,  Cardio-vascular:  No chest pain, Orthopnea, PND, anasarca, dizziness, palpitations.no Bilateral lower extremity swelling  GI:  No heartburn, indigestion, abdominal pain, nausea, vomiting, diarrhea, change in bowel habits, loss of appetite, melena,  blood in stool, hematemesis Resp:  no shortness of breath at rest. No dyspnea on exertion, No excess mucus, no productive cough, No non-productive cough, No coughing up of blood.No change in color of mucus.No wheezing. Skin:  no rash or lesions. No jaundice GU:  no dysuria, change in color of urine, no urgency or frequency. No straining to urinate.  No flank pain.  Musculoskeletal:  No joint pain or no joint swelling. No decreased range of motion. No back pain.  Psych:  No change in mood or affect. No depression or anxiety. No memory loss.  Neuro:, no tingling, no weakness, no double vision, no gait abnormality, no slurred speech, no confusion  All systems reviewed and apart from HOPI all are negative _______________________________________________________________________________________________ Past Medical History:   Past Medical History:  Diagnosis Date  . ADD (attention deficit disorder)   . Anxiety and depression   . Asthma   . Bipolar 1 disorder (HCC)   . Dumping syndrome   . GERD (gastroesophageal reflux disease)   . Mental disorder   . PONV (postoperative nausea and vomiting)   . Pre-diabetes      Past Surgical History:  Procedure Laterality Date  . BILATERAL SALPINGECTOMY Bilateral 04/13/2021   Procedure: BILATERAL SALPINGECTOMY;  Surgeon: Schermerhorn, Ihor Austin, MD;  Location: [facility] ORS;  Service: Gynecology;  Laterality: Bilateral;  .  CHOLECYSTECTOMY  2007  . fatty tumor removal  1992  . IUD REMOVAL N/A 04/13/2021   Procedure: INTRAUTERINE DEVICE (IUD) REMOVAL;  Surgeon: Schermerhorn, Ihor Austin, MD;  Location: [facility] OR[facility];  Service: Gynecology;  Laterality: N/A;  . SINUS SURGERY WITH INSTATRAK     unsure, maybe 4 years ago  . VAGINAL HYSTERECTOMY N/A 04/13/2021   Procedure: HYSTERECTOMY VAGINAL;  Surgeon: Schermerhorn, Ihor Austin, MD;  Location: [facility] OR[facility];  Service: Gynecology;  Laterality: N/A;  . WISDOM TOOTH EXTRACTION      Social History:  Ambulatory   independently      reports that she quit smoking about 8 years ago. Her smoking use included cigarettes. She has never used smokeless tobacco. She reports current alcohol use. She reports current drug use. Drug: Marijuana.   Family History:  Family History  Problem Relation Age of Onset  . Cancer Other    ______________________________________________________________________________________________ Allergies: Allergies  Allergen Reactions  . Morphine And Codeine Shortness Of Breath and Anxiety  . Tizanidine Other (See Comments)    Psychosis  Other Reaction(s): Hallucination, Mental Status Changes, Other (See Comments), Psychosis  Crying, "put me in a bad mental place"  Crying, "put me in a bad mental place"    Psychosis  . Prednisone Other (See Comments)    "Very emotional. Can't stop crying."  Other Reaction(s): Other (See Comments)  "Very emotional. Can't stop crying." Reports it mainly happens with the taper down pack, not with low dose steroid use.     Prior to Admission medications   Medication Sig Start Date End Date Taking? Authorizing Provider  Accu-Chek Softclix Lancets lancets  01/17/23   [provider]  acetaminophen (TYLENOL) 325 MG tablet Take 325 mg by mouth every 6 (six) hours as needed for mild pain (pain score 1-3), moderate pain (pain score 4-6), fever or headache. 12/23/21   [provider]  albuterol (VENTOLIN HFA) 108  (90 Base) MCG/ACT inhaler Inhale 2 puffs into the lungs every 6 (six) hours as needed for wheezing or shortness of breath. 06/07/23   Shaune Pollack, MD  aluminum chloride (DRYSOL) 20 % external solution Apply 1  Application topically at bedtime.  APPLY TOPICALLY AT BEDTIME APPLY SOLUTION SPARINGLY. 12/03/22   [provider]  amoxicillin (AMOXIL) 500 MG capsule Take 1 capsule (500 mg total) by mouth 3 (three) times daily. 10/29/23   Elson Areas, PA-C  atorvastatin (LIPITOR) 20 MG tablet Take 1 tablet by mouth at bedtime. 10/08/23 10/07/24  [provider]  colestipol (COLESTID) 1 g tablet Take 2 g by mouth at bedtime. 01/20/21   [provider]  Cysteamine Bitartrate (PROCYSBI) 300 MG PACK See admin instructions. 12/14/21   [provider]  dicyclomine (BENTYL) 20 MG tablet Take 1 tablet (20 mg total) by mouth 2 (two) times daily. 02/27/22   Marita Kansas, PA-C  Dulaglutide 0.75 MG/0.5ML SOAJ Inject 0.75 mg into the skin once a week. 01/06/23   [provider]  DULoxetine (CYMBALTA) 20 MG capsule Take 20 mg by mouth at bedtime.    [provider]  DULoxetine (CYMBALTA) 60 MG capsule Take 1 capsule by mouth daily. 08/26/23 08/25/24  [provider]  fluticasone (FLONASE) 50 MCG/ACT nasal spray Place 1 spray into both nostrils daily as needed for allergies. 01/24/21   [provider]  folic acid (FOLVITE) 400 MCG tablet Take 400 mcg by mouth daily.    [provider]  FOLIC ACID PO Take 1 tablet by mouth daily.    [provider]  ibuprofen (ADVIL) 400 MG tablet Take 400 mg by mouth every 6 (six) hours as needed.    [provider]  ketoconazole (NIZORAL) 2 % shampoo Apply 1 Application topically 2 (two) times a week. 02/19/22   [provider]  lisinopril (ZESTRIL) 2.5 MG tablet Take 2.5 mg by mouth daily. 06/17/23   [provider]  loratadine (CLARITIN) 10 MG tablet Take 10 mg by mouth daily.  01/29/22   [provider]  metFORMIN (GLUCOPHAGE) 500 MG tablet Take 500 mg by mouth 2 (two) times daily.    [provider]  omeprazole (PRILOSEC) 40 MG capsule Take 40 mg by mouth at bedtime.    [provider]  oxyCODONE (OXY IR/ROXICODONE) 5 MG immediate release tablet Take 5 mg by mouth every 6 (six) hours as needed. 05/12/23   [provider]  phenazopyridine (PYRIDIUM) 200 MG tablet Take 200 mg by mouth 3 (three) times daily as needed for pain. 01/28/23   [provider]  polyethylene glycol (MIRALAX) 17 g packet Take one cap full/package twice daily until BMs are soft and regular, then 1-2 x daily as needed for constipation. 06/22/23   Shaune Pollack, MD  PRECISION QID TEST test strip 1 each as needed. 08/29/23 08/28/24  [provider]  prednisoLONE acetate (PRED FORTE) 1 % ophthalmic suspension 5 drops as needed (USE 5 DROPS IN BOTH EARS DAILY AS NEEDED FOR ITCHING). 01/29/22   [provider]  prochlorperazine (COMPAZINE) 5 MG tablet Take 5 mg by mouth every 6 (six) hours as needed for nausea/vomiting. 02/21/21   [provider]  traZODone (DESYREL) 50 MG tablet Take 50 mg by mouth at bedtime.    [provider]  valsartan (DIOVAN) 80 MG tablet Take 1 tablet by mouth daily. 10/08/23 10/07/24  [provider]  WIXELA INHUB 100-50 MCG/ACT AEPB Inhale 1 puff into the lungs 2 (two) times daily. 06/17/23   [provider]    ___________________________________________________________________________________________________ Physical Exam:    02/12/2024   10:19 PM 02/12/2024    6:15 PM 02/12/2024    4:18 PM  Vitals  with BMI  Height   5\' 6"   Weight   270 lbs  BMI   43.6  Systolic 114 106   Diastolic 74 85   Pulse 89 94      1. General:  in No  Acute distress   well -appearing 2. Psychological: Alert and   Oriented 3. Head/ENT:     Mucous Membranes                          Head Non traumatic,  neck supple                        Poor Dentition 4. SKIN:  decreased Skin turgor,  Skin clean Dry and intact no rash    5. Heart: Regular rate and rhythm no*** Murmur, no Rub or gallop 6. Lungs: ***Clear to auscultation bilaterally, no wheezes or crackles   7. Abdomen: Soft, ***non-tender, Non distended *** obese ***bowel sounds present 8. Lower extremities: no clubbing, cyanosis, no ***edema 9. Neurologically Grossly intact, moving all 4 extremities equally *** strength 5 out of 5 in all 4 extremities cranial nerves II through XII intact 10. MSK: Normal range of motion    Chart has been reviewed  ______________________________________________________________________________________________  Assessment/Plan  ***  Admitted for   Seizure-like activity (HCC)    Present on Admission: **None**     No problem-specific Assessment & Plan notes found for this encounter.   Other plan as per orders.  DVT prophylaxis:  SCD    Code Status:    Code Status: Prior FULL CODE   as per patient ***family  I had personally discussed CODE STATUS with patient   ACP   none   Family Communication:   Family not at  Bedside  plan of care was discussed on the phone with *** Son, Daughter, Wife, Husband, Sister, Brother , father, mother  Diet  Diet Orders (From admission, onward)     Start     Ordered   02/12/24 1621  Diet NPO time specified  Diet effective now        02/12/24 1622            Disposition Plan:   *** likely will need placement for rehabilitation                          Back to current facility when stable                            To home once workup is complete and patient is stable  ***Following barriers for discharge:                             Chest pain *** Stroke *** work up is complete                            Electrolytes corrected                               Anemia corrected h/H stable                             Pain controlled with PO medications  Afebrile, white count improving able to transition to PO antibiotics                             Will need to be able to tolerate PO                            Will likely need home health, home O2, set up                           Will need consultants to evaluate patient prior to discharge       Consult Orders  (From admission, onward)           Start     Ordered   02/12/24 2306  Consult to hospitalist  Once       Provider:  (Not yet assigned)  Question Answer Comment  Place call to: [facility]   Reason for Consult Admit      02/12/24 2305                              ***Would benefit from PT/OT eval prior to DC  Ordered                   Swallow eval - SLP ordered                   Diabetes care coordinator                   Transition of care consulted                   Nutrition    consulted                  Wound care  consulted                   Palliative care    consulted                   Behavioral health  consulted                    Consults called: ***     Admission status:  ED Disposition     ED Disposition  Admit   Condition  --   Comment  The patient appears reasonably stabilized for admission considering the current resources, flow, and capabilities available in the ED at this time, and I doubt any other [facility] requiring further screening and/or treatment in the ED prior to admission is  present.           Obs***  ***  inpatient     I Expect 2 midnight stay secondary to severity of patient's current illness need for inpatient interventions justified by the following: ***hemodynamic instability despite optimal treatment (tachycardia *hypotension * tachypnea *hypoxia, hypercapnia) * Severe lab/radiological/exam abnormalities including:     and extensive comorbidities including: *substance abuse  *Chronic pain *DM2  * CHF * CAD  * COPD/asthma *Morbid Obesity * CKD *dementia *liver disease *history  of stroke with residual deficits *  malignancy, * sickle cell disease  History of amputation Chronic anticoagulation  That are currently affecting medical management.   I expect  patient to be hospitalized for 2 midnights requiring inpatient medical care.  Patient is at high risk for adverse outcome (such as loss of life or disability) if not treated.  Indication for inpatient stay as follows:  Severe change from baseline regarding mental status Hemodynamic instability despite maximal medical therapy,  ongoing suicidal ideations,  severe pain requiring acute inpatient management,  inability to maintain oral hydration   persistent chest pain despite medical management Need for operative/procedural  intervention New or worsening hypoxia   Need for IV antibiotics, IV fluids, IV rate controling medications, IV antihypertensives, IV pain medications, IV anticoagulation, need for biPAP    Level of care   *** tele  For 12H 24H     medical floor       progressive     stepdown   tele indefinitely please discontinue once patient no longer qualifies COVID-19 Labs    Lab Results  Component Value Date   SARSCOV2NAA NEGATIVE 10/15/2023     Precautions: admitted as *** Covid Negative  ***asymptomatic screening protocol****PUI *** covid positive No active isolations ***If Covid PCR is negative  - please DC precautions - would need additional investigation given very high risk for false native test result    Critical***  Patient is critically ill due to  hemodynamic instability * respiratory failure *severe sepsis* ongoing chest pain*  They are at high risk for life/limb threatening clinical deterioration requiring frequent reassessment and modifications of care.  Services provided include examination of the patient, review of relevant ancillary tests, prescription of lifesaving therapies, review of medications and prophylactic therapy.  Total critical care time excluding separately  billable procedures: 60*  Minutes.    Donielle Radziewicz 02/12/2024, 11:46 PM ***  [facility]     after 2 AM please page floor coverage PA If 7AM-7PM, please contact the day team taking care of the patient using [email or web address]

## 2024-02-13 NOTE — Discharge Instructions (Signed)
 Advised to follow-up with primary care physician in 1 week. Advised to follow-up with psychiatry to establish care. Advised to refrain from driving since these episodes can make her unconscious. Patient has nonepileptic seizures

## 2024-03-12 ENCOUNTER — Ambulatory Visit: Payer: Self-pay

## 2024-03-19 ENCOUNTER — Ambulatory Visit
Admission: EM | Admit: 2024-03-19 | Discharge: 2024-03-19 | Disposition: A | Discharge status: Home / Self Care | Payer: Self-pay

## 2024-03-19 ENCOUNTER — Emergency Department (HOSPITAL_BASED_OUTPATIENT_CLINIC_OR_DEPARTMENT_OTHER)
Admission: EM | Admit: 2024-03-19 | Discharge: 2024-03-19 | Discharge status: Against Medical Advice | Payer: MEDICAID | Attending: Emergency Medicine | Admitting: Emergency Medicine

## 2024-03-19 ENCOUNTER — Emergency Department (HOSPITAL_BASED_OUTPATIENT_CLINIC_OR_DEPARTMENT_OTHER): Payer: MEDICAID | Admitting: Radiology

## 2024-03-19 ENCOUNTER — Other Ambulatory Visit: Payer: Self-pay

## 2024-03-19 DIAGNOSIS — R531 Weakness: Secondary | ICD-10-CM

## 2024-03-19 DIAGNOSIS — R42 Dizziness and giddiness: Secondary | ICD-10-CM | POA: Diagnosis not present

## 2024-03-19 DIAGNOSIS — R0602 Shortness of breath: Secondary | ICD-10-CM

## 2024-03-19 DIAGNOSIS — R051 Acute cough: Secondary | ICD-10-CM

## 2024-03-19 DIAGNOSIS — Z5321 Procedure and treatment not carried out due to patient leaving prior to being seen by health care provider: Secondary | ICD-10-CM | POA: Insufficient documentation

## 2024-03-19 DIAGNOSIS — R110 Nausea: Secondary | ICD-10-CM | POA: Insufficient documentation

## 2024-03-19 LAB — BASIC METABOLIC PANEL WITH GFR
Anion gap: 10 (ref 5–15)
BUN: 11 mg/dL (ref 6–20)
CO2: 26 mmol/L (ref 22–32)
Calcium: 9.2 mg/dL (ref 8.9–10.3)
Chloride: 102 mmol/L (ref 98–111)
Creatinine, Ser: 1.02 mg/dL — ABNORMAL HIGH (ref 0.44–1.00)
GFR, Estimated: >60 mL/min (ref >60)
Glucose, Bld: 93 mg/dL (ref 70–99)
Potassium: 3.3 mmol/L — ABNORMAL LOW (ref 3.5–5.1)
Sodium: 138 mmol/L (ref 135–145)

## 2024-03-19 LAB — CBC
HCT: 39.9 % (ref 36.0–46.0)
Hemoglobin: 13 g/dL (ref 12.0–15.0)
MCH: 27.5 pg (ref 26.0–34.0)
MCHC: 32.6 g/dL (ref 30.0–36.0)
MCV: 84.5 fL (ref 80.0–100.0)
Platelets: 374 10*3/uL (ref 150–400)
RBC: 4.72 MIL/uL (ref 3.87–5.11)
RDW: 13.6 % (ref 11.5–15.5)
WBC: 10.7 10*3/uL — ABNORMAL HIGH (ref 4.0–10.5)
nRBC: 0 % (ref 0.0–0.2)

## 2024-03-19 LAB — TROPONIN I (HIGH SENSITIVITY): Troponin I (High Sensitivity): 2 ng/L (ref <18)

## 2024-03-19 NOTE — Discharge Instructions (Signed)
Please go to the ER as soon as you leave urgent care for further evaluation and management.  

## 2024-03-19 NOTE — ED Notes (Signed)
 Pt seen in triage by this RT. BBS clear , good air movement, SATS 98% on room air. Pt in no distress at this time.

## 2024-03-19 NOTE — ED Triage Notes (Signed)
 Pt presents with the sxs listed x 2 days. Pt complains of dizziness for 3 days.

## 2024-03-19 NOTE — ED Notes (Signed)
 Called for room. No reply.

## 2024-03-19 NOTE — ED Provider Notes (Signed)
 [facility]    CSN: [id number] Arrival date & time: 03/19/24  1911      History   Chief Complaint Chief Complaint  Patient presents with   Emesis   Fatigue   Extremity Weakness   Cough   Dizziness   fluid retention    HPI Holly Wagner is a 33 y.o. female.   Patient presents with multiple different chief complaints.  Reports 2-week history of nausea, fatigue, decreased appetite, extremity weakness and tingling, cough, nasal congestion, dizziness, urinary retention.  Patient states that symptoms started off with nasal congestion and cough but she attributed this to allergies.  She has not been eating very much but reports that she has been increasingly thirsty over the past 2 weeks so has been drinking a lot of water.  Reports history of asthma but has been using her albuterol inhaler with no improvement of shortness of breath.  Patient is not reporting any chest pain or headaches.  States that she has a history of urinary retention, and it has flared up over the past month intermittently.  She has been having difficulty emptying her bladder over the past few days.  Patient states that when this first started years ago, her bladder was pressing on her spine at that time.  Patient states that all of these episodes with multiple symptoms occur intermittently.  She does have a history of panic attacks but states that this feels different.  Medical history includes nonepileptic seizures, bipolar disorder, hypertension asthma, prediabetes, dumping syndrome.   Emesis Extremity Weakness  Cough Dizziness   Past Medical History:  Diagnosis Date   ADD (attention deficit disorder)    Anxiety and depression    Asthma    Bipolar 1 disorder (HCC)    Dumping syndrome    GERD (gastroesophageal reflux disease)    Mental disorder    PONV (postoperative nausea and vomiting)    Pre-diabetes     Patient Active Problem List   Diagnosis Date Noted   Tobacco abuse 02/13/2024    IUD (intrauterine device) in place 10/29/2023   At risk for sleep apnea 10/21/2023   Seizure-like activity (HCC) 10/21/2023   Chronic constipation 10/08/2023   Essential hypertension 10/08/2023   Hydrosalpinx 07/24/2023   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   Allergic rhinitis 06/12/2023   Asthma 06/12/2023   Bipolar disorder (HCC) 06/12/2023   Eczema 06/12/2023   Migraine without aura and without status migrainosus, not intractable 06/12/2023   Right ovarian cyst 06/12/2023   Mixed stress and urge incontinence 06/12/2023   Morbid obesity (HCC) 06/12/2023   Controlled type 2 diabetes mellitus without complication, without long-term current use of insulin (HCC) 08/27/2022   Chronic tonsillitis 01/29/2022   Enlarged tonsils 01/29/2022   Throat pain in adult 01/29/2022   Dizziness 06/26/2021   Vertigo 06/26/2021   Tinnitus, bilateral 06/26/2021   Cyclical vomiting syndrome 02/16/2021   Gastroenteritis 02/16/2021   Metabolic acidosis with normal anion gap and bicarbonate losses 02/16/2021   Abnormal involuntary movement 10/13/2020   Tremor of face and hands 10/13/2020   Marijuana use 11/16/2018   Chronic diarrhea 08/14/2018   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   Gastroesophageal reflux disease without esophagitis 08/14/2018   Bipolar disorder in partial remission (HCC) 01/08/2016   Evaluation regarding contraception options 01/08/2016   Fetal hydronephrosis in pregnancy, antepartum condition 12/28/2015   Group B Streptococcus carrier, +RV culture, currently pregnant 12/24/2015   Gestational diabetes mellitus (GDM) affecting pregnancy 12/21/2015   Irregular  contractions 10/31/2015   Indication for care in labor and delivery, antepartum 09/09/2015   Encounter for IUD removal 05/03/2014   Oral contraceptive prescribed 05/03/2014   Skin tag of vaginal mucosa 05/03/2014   Vaginal discharge 05/03/2014    Past Surgical History:  Procedure Laterality Date    BILATERAL SALPINGECTOMY Bilateral 04/13/2021   Procedure: BILATERAL SALPINGECTOMY;  Surgeon: Schermerhorn, Ihor Austin, MD;  Location: [facility];  Service: Gynecology;  Laterality: Bilateral;   CHOLECYSTECTOMY  2007   fatty tumor removal  1992   IUD REMOVAL N/A 04/13/2021   Procedure: INTRAUTERINE DEVICE (IUD) REMOVAL;  Surgeon: Schermerhorn, Ihor Austin, MD;  Location: [facility];  Service: Gynecology;  Laterality: N/A;   SINUS SURGERY WITH INSTATRAK     unsure, maybe 4 years ago   VAGINAL HYSTERECTOMY N/A 04/13/2021   Procedure: HYSTERECTOMY VAGINAL;  Surgeon: Schermerhorn, Ihor Austin, MD;  Location: [facility];  Service: Gynecology;  Laterality: N/A;   WISDOM TOOTH EXTRACTION      OB History     Gravida  2   Para  1   Term  1   Preterm      AB      Living  1      SAB      IAB      Ectopic      Multiple      Live Births               Home Medications    Prior to Admission medications   Medication Sig Start Date End Date Taking? Authorizing Provider  albuterol (VENTOLIN HFA) 108 (90 Base) MCG/ACT inhaler Inhale 2 puffs into the lungs every 6 (six) hours as needed for wheezing or shortness of breath. 06/07/23  Yes Shaune Pollack, MD  atorvastatin (LIPITOR) 20 MG tablet Take 1 tablet by mouth at bedtime. 10/08/23 10/07/24 Yes [provider]  DULoxetine (CYMBALTA) 60 MG capsule Take 1 capsule by mouth daily. 08/26/23 08/25/24 Yes [provider]  metFORMIN (GLUCOPHAGE) 500 MG tablet Take 500 mg by mouth 2 (two) times daily.   Yes [provider]  traZODone (DESYREL) 50 MG tablet Take 50 mg by mouth at bedtime.   Yes [provider]  valsartan (DIOVAN) 80 MG tablet Take 1 tablet by mouth daily. 10/08/23 10/07/24 Yes [provider]  Accu-Chek Softclix Lancets lancets  01/17/23   [provider]  colestipol (COLESTID) 1 g tablet Take 1 g by mouth at bedtime. 01/20/21   [provider]  fluticasone (FLONASE) 50 MCG/ACT  nasal spray Place 1 spray into both nostrils daily as needed for allergies. 01/24/21   [provider]  folic acid (FOLVITE) 400 MCG tablet Take 400 mcg by mouth daily.    [provider]  PRECISION QID TEST test strip 1 each as needed. 08/29/23 08/28/24  [provider]    Family History Family History  Problem Relation Age of Onset   Cancer Other     Social History Social History   Tobacco Use   Smoking status: Former    Current packs/day: 0.00    Types: Cigarettes    Quit date: 04/30/2015    Years since quitting: 8.8   Smokeless tobacco: Never  Vaping Use   Vaping status: Every Day   Substances: Nicotine, Flavoring  Substance Use Topics   Alcohol use: Yes    Comment: Socially.,   Drug use: Yes    Types: Marijuana    Comment: Occassional  Allergies   Morphine and codeine, Tizanidine, and Prednisone   Review of Systems Review of Systems  Musculoskeletal:  Positive for extremity weakness.  Per HPI   Physical Exam Triage Vital Signs ED Triage Vitals  Encounter Vitals Group     BP 03/19/24 1935 122/88     Systolic BP Percentile --      Diastolic BP Percentile --      Pulse Rate 03/19/24 1935 96     Resp 03/19/24 1935 18     Temp 03/19/24 1935 98.6 F (37 C)     Temp Source 03/19/24 1935 Oral     SpO2 03/19/24 1935 99 %     Weight --      Height --      Head Circumference --      Peak Flow --      Pain Score 03/19/24 1932 1     Pain Loc --      Pain Education --      Exclude from Growth Chart --    No data found.  Updated Vital Signs BP 122/88 (BP Location: Left Arm)   Pulse 96   Temp 98.6 F (37 C) (Oral)   Resp 18   LMP 04/12/2021 (Exact Date)   SpO2 99%   Visual Acuity Right Eye Distance:   Left Eye Distance:   Bilateral Distance:    Right Eye Near:   Left Eye Near:    Bilateral Near:     Physical Exam Constitutional:      Appearance: Normal appearance. She is not toxic-appearing or diaphoretic.  HENT:      Head: Normocephalic and atraumatic.     Right Ear: Tympanic membrane, ear canal and external ear normal.     Left Ear: Ear canal and external ear normal. A middle ear effusion is present. Tympanic membrane is not perforated, erythematous or bulging.     Nose: Nose normal.     Mouth/Throat:     Mouth: Mucous membranes are moist.     Pharynx: No posterior oropharyngeal erythema.  Eyes:     Extraocular Movements: Extraocular movements intact.     Conjunctiva/sclera: Conjunctivae normal.     Pupils: Pupils are equal, round, and reactive to light.  Cardiovascular:     Rate and Rhythm: Normal rate and regular rhythm.     Pulses: Normal pulses.     Heart sounds: Normal heart sounds.  Pulmonary:     Breath sounds: Normal breath sounds. No stridor. No wheezing, rhonchi or rales.     Comments: Patient became tachypnic during physical exam.  Abdominal:     General: Bowel sounds are normal. There is no distension.     Palpations: Abdomen is soft.     Tenderness: There is no abdominal tenderness.  Musculoskeletal:        General: Normal range of motion.     Cervical back: Normal range of motion.  Skin:    General: Skin is warm.  Neurological:     General: No focal deficit present.     Mental Status: She is alert and oriented to person, place, and time. Mental status is at baseline.     Cranial Nerves: Cranial nerves 2-12 are intact.     Sensory: Sensation is intact.     Motor: Motor function is intact.     Coordination: Coordination is intact.     Gait: Gait is intact.  Psychiatric:        Mood and Affect: Mood normal.  Behavior: Behavior normal.        Thought Content: Thought content normal.        Judgment: Judgment normal.      UC Treatments / Results  Labs (all labs ordered are listed, but only abnormal results are displayed) Labs Reviewed - No data to display  EKG   Radiology No results found.  Procedures Procedures (including critical care  time)  Medications Ordered in UC Medications - No data to display  Initial Impression / Assessment and Plan / UC Course  I have reviewed the triage vital signs and the nursing notes.  Pertinent labs & imaging results that were available during my care of the patient were reviewed by me and considered in my medical decision making (see chart for details).     Differential diagnoses include panic attack versus pneumonia versus asthma exacerbation versus pulmonary embolism versus DKA. Patient began having one of her "episodes" during physical exam.  She was taking deep big deep breaths intermittently and seemed very uncomfortable.  States these are the episodes that have been occurring over the past 2 weeks but have worsened over the past few days.  Discussed with patient that I can do a limited workup here in urgent care but given current state, recommended ER evaluation.  Patient was agreeable to going to the ER.  Vital signs stable at discharge and patient is not in any acute distress.  Agree with her husband transporting her to the ER. Final Clinical Impressions(s) / UC Diagnoses   Final diagnoses:  Shortness of breath  Dizziness and giddiness  Acute cough  Generalized weakness     Discharge Instructions      Please go to the ER as soon as you leave urgent care for further evaluation and management.     ED Prescriptions   None    PDMP not reviewed this encounter.   Gustavus Bryant, [place] 03/19/24 2014

## 2024-03-19 NOTE — ED Notes (Signed)
 Patient is being discharged from the Urgent Care and sent to the Emergency Department via POV . Per [place], NP, patient is in need of higher level of care due to need for further evaluation of symptoms. Patient is aware and verbalizes understanding of plan of care. Declined EMS, family member present to drive to ED.  Vitals:   03/19/24 1935  BP: 122/88  Pulse: 96  Resp: 18  Temp: 98.6 F (37 C)  SpO2: 99%

## 2024-03-19 NOTE — ED Triage Notes (Signed)
 Went to UC today for 2 weeks of reduce appetite and nauseated. Dizziness and SOB x2-3 days. Worse with exertion. Denies any bleeding, diarrhea.

## 2024-04-04 ENCOUNTER — Emergency Department (HOSPITAL_BASED_OUTPATIENT_CLINIC_OR_DEPARTMENT_OTHER): Payer: MEDICAID

## 2024-04-04 ENCOUNTER — Encounter (HOSPITAL_BASED_OUTPATIENT_CLINIC_OR_DEPARTMENT_OTHER): Payer: Self-pay | Admitting: Emergency Medicine

## 2024-04-04 ENCOUNTER — Emergency Department (HOSPITAL_BASED_OUTPATIENT_CLINIC_OR_DEPARTMENT_OTHER)
Admission: EM | Admit: 2024-04-04 | Discharge: 2024-04-04 | Disposition: A | Discharge status: Home / Self Care | Payer: MEDICAID | Attending: Emergency Medicine | Admitting: Emergency Medicine

## 2024-04-04 DIAGNOSIS — R339 Retention of urine, unspecified: Secondary | ICD-10-CM | POA: Insufficient documentation

## 2024-04-04 DIAGNOSIS — K5732 Diverticulitis of large intestine without perforation or abscess without bleeding: Secondary | ICD-10-CM | POA: Diagnosis not present

## 2024-04-04 DIAGNOSIS — R1030 Lower abdominal pain, unspecified: Secondary | ICD-10-CM | POA: Diagnosis present

## 2024-04-04 DIAGNOSIS — K5792 Diverticulitis of intestine, part unspecified, without perforation or abscess without bleeding: Secondary | ICD-10-CM

## 2024-04-04 DIAGNOSIS — R102 Pelvic and perineal pain: Secondary | ICD-10-CM

## 2024-04-04 LAB — URINALYSIS, ROUTINE W REFLEX MICROSCOPIC
Bilirubin Urine: NEGATIVE
Glucose, UA: NEGATIVE mg/dL
Hgb urine dipstick: NEGATIVE
Ketones, ur: NEGATIVE mg/dL
Leukocytes,Ua: NEGATIVE
Nitrite: NEGATIVE
Protein, ur: NEGATIVE mg/dL
Specific Gravity, Urine: 1.016 (ref 1.005–1.030)
pH: 7.5 (ref 5.0–8.0)

## 2024-04-04 LAB — COMPREHENSIVE METABOLIC PANEL WITH GFR
ALT: 21 U/L (ref 0–44)
AST: 23 U/L (ref 15–41)
Albumin: 4.1 g/dL (ref 3.5–5.0)
Alkaline Phosphatase: 55 U/L (ref 38–126)
Anion gap: 5 (ref 5–15)
BUN: 10 mg/dL (ref 6–20)
CO2: 31 mmol/L (ref 22–32)
Calcium: 8.8 mg/dL — ABNORMAL LOW (ref 8.9–10.3)
Chloride: 102 mmol/L (ref 98–111)
Creatinine, Ser: 0.99 mg/dL (ref 0.44–1.00)
GFR, Estimated: >60 mL/min (ref >60)
Glucose, Bld: 131 mg/dL — ABNORMAL HIGH (ref 70–99)
Potassium: 3.7 mmol/L (ref 3.5–5.1)
Sodium: 138 mmol/L (ref 135–145)
Total Bilirubin: 0.4 mg/dL (ref 0.0–1.2)
Total Protein: 6.7 g/dL (ref 6.5–8.1)

## 2024-04-04 LAB — CBC
HCT: 35.5 % — ABNORMAL LOW (ref 36.0–46.0)
Hemoglobin: 11.4 g/dL — ABNORMAL LOW (ref 12.0–15.0)
MCH: 27.7 pg (ref 26.0–34.0)
MCHC: 32.1 g/dL (ref 30.0–36.0)
MCV: 86.2 fL (ref 80.0–100.0)
Platelets: 296 10*3/uL (ref 150–400)
RBC: 4.12 MIL/uL (ref 3.87–5.11)
RDW: 14.3 % (ref 11.5–15.5)
WBC: 7.7 10*3/uL (ref 4.0–10.5)
nRBC: 0 % (ref 0.0–0.2)

## 2024-04-04 LAB — LIPASE, BLOOD: Lipase: 17 U/L (ref 11–51)

## 2024-04-04 MED ORDER — AMOXICILLIN-POT CLAVULANATE 875-125 MG PO TABS: 1.0000 | ORAL_TABLET | Freq: Two times a day (BID) | ORAL | 0 refills | Status: DC

## 2024-04-04 MED ORDER — IOHEXOL 300 MG/ML  SOLN
100.0000 mL | Freq: Once | INTRAMUSCULAR | Status: AC | PRN
  Administered 2024-04-04: 100 mL via INTRAVENOUS

## 2024-04-04 MED ORDER — FLUCONAZOLE 150 MG PO TABS: 150.0000 mg | ORAL_TABLET | Freq: Every day | ORAL | 0 refills | Status: DC

## 2024-04-04 MED ORDER — AMOXICILLIN-POT CLAVULANATE 875-125 MG PO TABS
1.0000 | ORAL_TABLET | Freq: Once | ORAL | Status: AC
  Administered 2024-04-04: 1 via ORAL
  Filled 2024-04-04: qty 1

## 2024-04-04 NOTE — ED Triage Notes (Signed)
 Bladder retention lower abdo discomfort, increased incontinence x  1 month  Increased pain over the last 2 days

## 2024-04-04 NOTE — Discharge Instructions (Addendum)
 You were seen in the emergency department for lower abdominal pain and urinary symptoms.  Your lab work and urinalysis were unremarkable but your CAT scan showed acute diverticulitis.  This is treated with antibiotics and bowel rest.  Please start with clear liquids and advance as tolerated.  He will also need follow-up with your gynecology team for further evaluation of your pelvic pain.  Return if any worsening or concerning symptoms.

## 2024-04-04 NOTE — ED Notes (Signed)
 Bladder scanner not available, US  at bedside.

## 2024-04-04 NOTE — ED Provider Notes (Signed)
 [place] EMERGENCY DEPARTMENT AT [facility] Provider Note   CSN: [id number] Arrival date & time: 04/04/24  1630     History  Chief Complaint  Patient presents with   Abdominal Pain    Holly Wagner is a 33 y.o. female.  She is here with a complaint of on and off urinary retention and incontinence that is been going on for a long time.  She feels its gotten worse.  For the last few days she has had more lower abdominal pressure and discomfort.  Worse with urination and intercourse.  No vaginal bleeding or discharge.  No fevers or chills nausea vomiting.  Has baseline constipation.  She said she feels sometimes some bubbles passing in her urine at the end of her urination.  Has not seen her gynecologist for 6 to 8 months due to insurance just use but was told she needed to see a GYN urologist at some point.    Abdominal Pain Pain location:  Suprapubic Pain quality: aching and cramping   Pain severity:  Moderate Onset quality:  Gradual Duration:  1 week Timing:  Intermittent Progression:  Unchanged Chronicity:  New Relieved by:  Nothing Worsened by:  Urination Ineffective treatments:  None tried Associated symptoms: constipation   Associated symptoms: no chest pain, no cough, no diarrhea, no dysuria, no fever, no hematuria, no nausea, no vaginal bleeding, no vaginal discharge and no vomiting        Home Medications Prior to Admission medications   Medication Sig Start Date End Date Taking? Authorizing Provider  Accu-Chek Softclix Lancets lancets  01/17/23   [provider]  albuterol  (VENTOLIN  HFA) 108 (90 Base) MCG/ACT inhaler Inhale 2 puffs into the lungs every 6 (six) hours as needed for wheezing or shortness of breath. 06/07/23   Loman Risk, MD  atorvastatin  (LIPITOR) 20 MG tablet Take 1 tablet by mouth at bedtime. 10/08/23 10/07/24  [provider]  colestipol (COLESTID) 1 g tablet Take 1 g by mouth at bedtime. 01/20/21   [provider]  DULoxetine (CYMBALTA) 60 MG capsule Take 1 capsule by mouth daily. 08/26/23 08/25/24  [provider]  fluticasone (FLONASE) 50 MCG/ACT nasal spray Place 1 spray into both nostrils daily as needed for allergies. 01/24/21   [provider]  folic acid (FOLVITE) 400 MCG tablet Take 400 mcg by mouth daily.    [provider]  metFORMIN (GLUCOPHAGE) 500 MG tablet Take 500 mg by mouth 2 (two) times daily.    [provider]  PRECISION QID TEST test strip 1 each as needed. 08/29/23 08/28/24  [provider]  traZODone (DESYREL) 50 MG tablet Take 50 mg by mouth at bedtime.    [provider]  valsartan (DIOVAN) 80 MG tablet Take 1 tablet by mouth daily. 10/08/23 10/07/24  [provider]      Allergies    Morphine  and codeine, Tizanidine, and Prednisone     Review of Systems   Review of Systems  Constitutional:  Negative for fever.  Respiratory:  Negative for cough.   Cardiovascular:  Negative for chest pain.  Gastrointestinal:  Positive for abdominal pain and constipation. Negative for diarrhea, nausea and vomiting.  Genitourinary:  Negative for dysuria, hematuria, vaginal bleeding and vaginal discharge.    Physical Exam Updated Vital Signs BP (!) 148/91 (BP Location: Right Arm)   Pulse 82   Temp 98.3 F (36.8 C)   Resp 18   LMP 04/12/2021 (Exact Date)   SpO2 100%  Physical Exam Vitals and nursing note reviewed.  Constitutional:      General: She is not in acute distress.    Appearance: Normal appearance. She is well-developed.  HENT:     Head: Normocephalic and atraumatic.  Eyes:     Conjunctiva/sclera: Conjunctivae normal.  Cardiovascular:     Rate and Rhythm: Normal rate and regular rhythm.     Heart sounds: No murmur heard. Pulmonary:     Effort: Pulmonary effort is normal. No respiratory distress.     Breath sounds: Normal breath sounds. No stridor. No wheezing.  Abdominal:     Palpations: Abdomen is  soft.     Tenderness: There is abdominal tenderness (lower abd). There is no guarding or rebound.  Musculoskeletal:        General: No tenderness or deformity. Normal range of motion.     Cervical back: Neck supple.  Skin:    General: Skin is warm and dry.  Neurological:     General: No focal deficit present.     Mental Status: She is alert.     GCS: GCS eye subscore is 4. GCS verbal subscore is 5. GCS motor subscore is 6.     Sensory: No sensory deficit.     Motor: No weakness.     ED Results / Procedures / Treatments   Labs (all labs ordered are listed, but only abnormal results are displayed) Labs Reviewed  COMPREHENSIVE METABOLIC PANEL WITH GFR - Abnormal; Notable for the following components:      Result Value   Glucose, Bld 131 (*)    Calcium  8.8 (*)    All other components within normal limits  CBC - Abnormal; Notable for the following components:   Hemoglobin 11.4 (*)    HCT 35.5 (*)    All other components within normal limits  URINALYSIS, ROUTINE W REFLEX MICROSCOPIC  LIPASE, BLOOD    EKG None  Radiology CT ABDOMEN PELVIS W CONTRAST Result Date: 04/04/2024 CLINICAL DATA:  Lower abdominal pain. Bladder retention and discomfort. Increased incontinence for 1 month. EXAM: CT ABDOMEN AND PELVIS WITH CONTRAST TECHNIQUE: Multidetector CT imaging of the abdomen and pelvis was performed using the standard protocol following bolus administration of intravenous contrast. RADIATION DOSE REDUCTION: This exam was performed according to the departmental dose-optimization program which includes automated exposure control, adjustment of the mA and/or kV according to patient size and/or use of iterative reconstruction technique. CONTRAST:  OMNIPAQUE  IOHEXOL  300 MG/ML  SOLN COMPARISON:  07/21/2023. FINDINGS: Lower chest: No acute abnormality. Hepatobiliary: No focal liver abnormality is seen. No biliary ductal dilatation. The gallbladder is not seen. Pancreas: Unremarkable. No  pancreatic ductal dilatation or surrounding inflammatory changes. Spleen: Normal in size without focal abnormality. Adrenals/Urinary Tract: The adrenal glands are within normal limits. The kidneys enhance symmetrically. No renal calculus or obstructive uropathy bilaterally. Extrarenal pelvises are present bilaterally. The bladder is unremarkable. Stomach/Bowel: Stomach is within normal limits. Appendix appears normal. No bowel obstruction, free air or pneumatosis is seen. Scattered diverticula are present along the sigmoid colon with bowel wall thickening and surrounding fat stranding involving the distal sigmoid colon, best seen on axial image 77. Vascular/Lymphatic: No significant vascular findings are present. No enlarged abdominal or pelvic lymph nodes. Reproductive: The uterus is surgically absent. Elongated cystic structures are noted in the adnexal regions bilaterally measuring up to 7.9 cm on the right and 5.9 cm on the left. Other: No abdominopelvic ascites. Musculoskeletal: No acute osseous abnormality. IMPRESSION: 1. Findings compatible with sigmoid  diverticulitis. 2. No renal calculus or obstructive uropathy. 3. Cystic regions in the bilateral adnexa, increased in size from the prior exam, possible hydrosalpinx. Nonemergent ultrasound is recommended for further characterization. Electronically Signed   By: Wyvonnia Heimlich M.D.   On: 04/04/2024 19:08    Procedures Procedures    Medications Ordered in ED Medications  iohexol  (OMNIPAQUE ) 300 MG/ML solution 100 mL (100 mLs Intravenous Contrast Given 04/04/24 1810)  amoxicillin -clavulanate (AUGMENTIN ) 875-125 MG per tablet 1 tablet (1 tablet Oral Given 04/04/24 1932)    ED Course/ Medical Decision Making/ A&P Clinical Course as of 04/05/24 1607  Sun Apr 04, 2024  1918 Unfortunately bladder scan not available so was unable to document a postvoid residual.  Workup showing labs fairly unremarkable.  CT showing acute diverticulitis uncomplicated.  She  also has some increased fullness in her adnexa that will need outpatient follow-up with her gynecologist.  Reviewed with patient and she is comfortable plan for discharge.  Will put her on Augmentin .  Return instructions discussed. [MB]    Clinical Course User Index [MB] Tonya Fredrickson, MD                                 Medical Decision Making Amount and/or Complexity of Data Reviewed Labs: ordered. Radiology: ordered.  Risk Prescription drug management.   This patient complains of lower abdominal pain, difficulty urinating; this involves an extensive number of treatment Options and is a complaint that carries with it a high risk of complications and morbidity. The differential includes diverticulitis, colitis, PID, abscess, UTI  I ordered, reviewed and interpreted labs, which included CBC with normal white count, hemoglobin slightly lower than priors, chemistries and LFTs unremarkable, urinalysis without signs of infection I ordered medication oral antibiotics and reviewed PMP when indicated. I ordered imaging studies which included CT abdomen and pelvis and I independently    visualized and interpreted imaging which showed uncomplicated diverticulitis.  There is also some fullness of her adnexa that will need follow-up with GYN Additional history obtained from patient's significant other Previous records obtained and reviewed in epic including prior gynecology notes  Cardiac monitoring reviewed, normal sinus rhythm Social determinants considered, financial insecurity Critical Interventions: None  After the interventions stated above, I reevaluated the patient and found patient to have a fairly benign exam and nontoxic-appearing Admission and further testing considered, we will cover with antibiotics for diverticulitis and recommended follow-up with GYN for further evaluation of her pelvic complaints         Final Clinical Impression(s) / ED Diagnoses Final diagnoses:   Acute diverticulitis  Pelvic pain    Rx / DC Orders ED Discharge Orders          Ordered    amoxicillin -clavulanate (AUGMENTIN ) 875-125 MG tablet  Every 12 hours        04/04/24 1921    fluconazole  (DIFLUCAN ) 150 MG tablet  Daily        04/04/24 1927              Tonya Fredrickson, MD 04/05/24 1609

## 2024-05-31 ENCOUNTER — Ambulatory Visit
Admission: EM | Admit: 2024-05-31 | Discharge: 2024-05-31 | Disposition: A | Discharge status: Home / Self Care | Payer: MEDICAID

## 2024-05-31 ENCOUNTER — Encounter: Payer: Self-pay | Admitting: Emergency Medicine

## 2024-05-31 DIAGNOSIS — S161XXA Strain of muscle, fascia and tendon at neck level, initial encounter: Secondary | ICD-10-CM | POA: Diagnosis not present

## 2024-05-31 MED ORDER — CYCLOBENZAPRINE HCL 10 MG PO TABS
10.0000 mg | ORAL_TABLET | Freq: Two times a day (BID) | ORAL | 0 refills | Status: DC | PRN
Start: 2024-05-31 — End: 2024-10-22

## 2024-05-31 NOTE — ED Provider Notes (Signed)
 [facility]    CSN: [id number] Arrival date & time: 05/31/24  1736      History   Chief Complaint Chief Complaint  Patient presents with   Neck Pain    HPI Holly Wagner is a 33 y.o. female.   Patient presents today for evaluation of right sided neck pain that started last night.  She reports that she went to stretch her neck and she felt a hot tearing sensation down the right side of her neck and has had continued pain with movement since then.  She notes her neck feels stiff but she has also had some pain in other surrounding musculature as well.  She has tried ibuprofen , Tylenol  and IcyHot with mild relief.  She does not report any numbness or tingling.  The history is provided by the patient.  Neck Pain Associated symptoms: no fever     Past Medical History:  Diagnosis Date   ADD (attention deficit disorder)    Anxiety and depression    Asthma    Bipolar 1 disorder (HCC)    Dumping syndrome    GERD (gastroesophageal reflux disease)    Mental disorder    PONV (postoperative nausea and vomiting)    Pre-diabetes     Patient Active Problem List   Diagnosis Date Noted   Tobacco abuse 02/13/2024   IUD (intrauterine device) in place 10/29/2023   At risk for sleep apnea 10/21/2023   Seizure-like activity (HCC) 10/21/2023   Chronic constipation 10/08/2023   Essential hypertension 10/08/2023   Hydrosalpinx 07/24/2023   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   Allergic rhinitis 06/12/2023   Asthma 06/12/2023   Bipolar disorder (HCC) 06/12/2023   Eczema 06/12/2023   Migraine without aura and without status migrainosus, not intractable 06/12/2023   Right ovarian cyst 06/12/2023   Mixed stress and urge incontinence 06/12/2023   Morbid obesity (HCC) 06/12/2023   Controlled type 2 diabetes mellitus without complication, without long-term current use of insulin  (HCC) 08/27/2022   Chronic tonsillitis 01/29/2022   Enlarged tonsils 01/29/2022   Throat pain  in adult 01/29/2022   Dizziness 06/26/2021   Vertigo 06/26/2021   Tinnitus, bilateral 06/26/2021   Cyclical vomiting syndrome 02/16/2021   Gastroenteritis 02/16/2021   Metabolic acidosis with normal anion gap and bicarbonate losses 02/16/2021   Abnormal involuntary movement 10/13/2020   Tremor of face and hands 10/13/2020   Marijuana use 11/16/2018   Chronic diarrhea 08/14/2018   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   Gastroesophageal reflux disease without esophagitis 08/14/2018   Bipolar disorder in partial remission (HCC) 01/08/2016   Evaluation regarding contraception options 01/08/2016   Fetal hydronephrosis in pregnancy, antepartum condition 12/28/2015   Group B Streptococcus carrier, +RV culture, currently pregnant 12/24/2015   Gestational diabetes mellitus (GDM) affecting pregnancy 12/21/2015   Irregular contractions 10/31/2015   Indication for care in labor and delivery, antepartum 09/09/2015   Encounter for IUD removal 05/03/2014   Oral contraceptive prescribed 05/03/2014   Skin tag of vaginal mucosa 05/03/2014   Vaginal discharge 05/03/2014    Past Surgical History:  Procedure Laterality Date   BILATERAL SALPINGECTOMY Bilateral 04/13/2021   Procedure: BILATERAL SALPINGECTOMY;  Surgeon: Madelene Schanz, Joselyn Nicely, MD;  Location: [facility];  Service: Gynecology;  Laterality: Bilateral;   CHOLECYSTECTOMY  2007   fatty tumor removal  1992   IUD REMOVAL N/A 04/13/2021   Procedure: INTRAUTERINE DEVICE (IUD) REMOVAL;  Surgeon: Schermerhorn, Joselyn Nicely, MD;  Location: [facility];  Service: Gynecology;  Laterality: N/A;  SINUS SURGERY WITH INSTATRAK     unsure, maybe 4 years ago   VAGINAL HYSTERECTOMY N/A 04/13/2021   Procedure: HYSTERECTOMY VAGINAL;  Surgeon: Schermerhorn, Joselyn Nicely, MD;  Location: [facility];  Service: Gynecology;  Laterality: N/A;   WISDOM TOOTH EXTRACTION      OB History     Gravida  2   Para  1   Term  1   Preterm      AB      Living  1       SAB      IAB      Ectopic      Multiple      Live Births               Home Medications    Prior to Admission medications   Medication Sig Start Date End Date Taking? Authorizing Provider  Accu-Chek Softclix Lancets lancets  01/17/23  Yes [provider]  albuterol  (VENTOLIN  HFA) 108 (90 Base) MCG/ACT inhaler Inhale 2 puffs into the lungs every 6 (six) hours as needed for wheezing or shortness of breath. 06/07/23  Yes Loman Risk, MD  atorvastatin  (LIPITOR) 20 MG tablet Take 1 tablet by mouth at bedtime. 10/08/23 10/07/24 Yes [provider]  cyclobenzaprine  (FLEXERIL ) 10 MG tablet Take 1 tablet (10 mg total) by mouth 2 (two) times daily as needed for muscle spasms. 05/31/24  Yes Vernestine Gondola, PA-C  DULoxetine (CYMBALTA) 60 MG capsule Take 1 capsule by mouth daily. 08/26/23 08/25/24 Yes [provider]  fluticasone (FLONASE) 50 MCG/ACT nasal spray Place 1 spray into both nostrils daily as needed for allergies. 01/24/21  Yes [provider]  metFORMIN (GLUCOPHAGE) 500 MG tablet Take 500 mg by mouth 2 (two) times daily.   Yes [provider]  pantoprazole (PROTONIX) 40 MG tablet Take 40 mg by mouth daily. 05/11/24  Yes [provider]  PRECISION QID TEST test strip 1 each as needed. 08/29/23 08/28/24 Yes [provider]  traZODone (DESYREL) 50 MG tablet Take 50 mg by mouth at bedtime.   Yes [provider]  valsartan (DIOVAN) 80 MG tablet Take 1 tablet by mouth daily. 10/08/23 10/07/24 Yes [provider]  Vitamin D, Ergocalciferol, (DRISDOL) 1.25 MG (50000 UNIT) CAPS capsule Take 50,000 Units by mouth once a week. 05/11/24  Yes [provider]  amoxicillin -clavulanate (AUGMENTIN ) 875-125 MG tablet Take 1 tablet by mouth every 12 (twelve) hours. Patient not taking: Reported on 05/31/2024 04/04/24   Tonya Fredrickson, MD  colestipol (COLESTID) 1 g tablet Take 1 g by mouth at bedtime. Patient not  taking: Reported on 05/31/2024 01/20/21   [provider]  fluconazole  (DIFLUCAN ) 150 MG tablet Take 1 tablet (150 mg total) by mouth daily. Patient not taking: Reported on 05/31/2024 04/04/24   Tonya Fredrickson, MD  folic acid (FOLVITE) 400 MCG tablet Take 400 mcg by mouth daily.    [provider]    Family History Family History  Problem Relation Age of Onset   Cancer Other     Social History Social History   Tobacco Use   Smoking status: Former    Current packs/day: 0.00    Types: Cigarettes    Quit date: 04/30/2015    Years since quitting: 9.0   Smokeless tobacco: Never  Vaping Use   Vaping status: Every Day   Substances: Nicotine , Flavoring  Substance Use Topics   Alcohol use: Yes    Comment: Socially.,   Drug  use: Yes    Types: Marijuana    Comment: Occassional     Allergies   Morphine  and codeine, Tizanidine, Buspirone, and Prednisone    Review of Systems Review of Systems  Constitutional:  Negative for chills and fever.  Eyes:  Negative for discharge and redness.  Gastrointestinal:  Negative for abdominal pain, nausea and vomiting.  Genitourinary:  Positive for vaginal bleeding and vaginal discharge.  Musculoskeletal:  Positive for neck pain.     Physical Exam Triage Vital Signs ED Triage Vitals  Encounter Vitals Group     BP 05/31/24 1816 101/70     Girls Systolic BP Percentile --      Girls Diastolic BP Percentile --      Boys Systolic BP Percentile --      Boys Diastolic BP Percentile --      Pulse Rate 05/31/24 1816 100     Resp 05/31/24 1816 16     Temp 05/31/24 1816 98.2 F (36.8 C)     Temp Source 05/31/24 1816 Oral     SpO2 05/31/24 1816 96 %     Weight --      Height --      Head Circumference --      Peak Flow --      Pain Score 05/31/24 1817 7     Pain Loc --      Pain Education --      Exclude from Growth Chart --    No data found.  Updated Vital Signs BP 101/70 (BP Location: Left Arm)   Pulse 100   Temp  98.2 F (36.8 C) (Oral)   Resp 16   LMP 04/12/2021 (Exact Date)   SpO2 96%   Visual Acuity Right Eye Distance:   Left Eye Distance:   Bilateral Distance:    Right Eye Near:   Left Eye Near:    Bilateral Near:     Physical Exam Vitals and nursing note reviewed.  Constitutional:      General: She is not in acute distress.    Appearance: Normal appearance. She is not ill-appearing.  HENT:     Head: Normocephalic and atraumatic.   Eyes:     Conjunctiva/sclera: Conjunctivae normal.   Neck:     Comments: Decreased range of motion of neck in all directions due to pain.  Muscle spasm noted to right SCM.  Tenderness to palpation diffusely to right side of neck and upper back, No  midline cervical spine TTP Cardiovascular:     Rate and Rhythm: Normal rate.  Pulmonary:     Effort: Pulmonary effort is normal. No respiratory distress.   Musculoskeletal:     Cervical back: Tenderness present.   Neurological:     Mental Status: She is alert.   Psychiatric:        Mood and Affect: Mood normal.        Behavior: Behavior normal.        Thought Content: Thought content normal.      UC Treatments / Results  Labs (all labs ordered are listed, but only abnormal results are displayed) Labs Reviewed - No data to display  EKG   Radiology No results found.  Procedures Procedures (including critical care time)  Medications Ordered in UC Medications - No data to display  Initial Impression / Assessment and Plan / UC Course  I have reviewed the triage vital signs and the nursing notes.  Pertinent labs & imaging results that were available during my care  of the patient were reviewed by me and considered in my medical decision making (see chart for details).    Suspect likely muscle strain and will treat with muscle relaxer.  Advised patient to continue ibuprofen  for anti-inflammation as she does not do well with steroids.  Encouraged follow-up if no gradual improvement or with  any further concerns.  Final Clinical Impressions(s) / UC Diagnoses   Final diagnoses:  Neck strain, initial encounter   Discharge Instructions   None    ED Prescriptions     Medication Sig Dispense Auth. Provider   cyclobenzaprine  (FLEXERIL ) 10 MG tablet Take 1 tablet (10 mg total) by mouth 2 (two) times daily as needed for muscle spasms. 20 tablet Vernestine Gondola, PA-C      PDMP not reviewed this encounter.   Vernestine Gondola, PA-C 05/31/24 [phone]

## 2024-05-31 NOTE — ED Triage Notes (Signed)
 Pt reports acute neck pain episode that started last night. 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 States she was passively stretching the area when she felt a hot tearing sensation down R side of neck. Ibuprofen , tylenol , and icy hot giving little relief.

## 2024-07-07 DIAGNOSIS — M79642 Pain in left hand: Secondary | ICD-10-CM | POA: Insufficient documentation

## 2024-09-08 ENCOUNTER — Encounter: Payer: Self-pay | Admitting: Emergency Medicine

## 2024-09-08 ENCOUNTER — Ambulatory Visit
Admission: EM | Admit: 2024-09-08 | Discharge: 2024-09-08 | Disposition: A | Discharge status: Home / Self Care | Payer: MEDICAID

## 2024-09-08 DIAGNOSIS — J069 Acute upper respiratory infection, unspecified: Secondary | ICD-10-CM | POA: Diagnosis not present

## 2024-09-08 DIAGNOSIS — J4521 Mild intermittent asthma with (acute) exacerbation: Secondary | ICD-10-CM

## 2024-09-08 MED ORDER — LEVOCETIRIZINE DIHYDROCHLORIDE 5 MG PO TABS: 5.0000 mg | ORAL_TABLET | Freq: Every day | ORAL | 0 refills | Status: DC

## 2024-09-08 MED ORDER — PSEUDOEPH-BROMPHEN-DM 30-2-10 MG/5ML PO SYRP: 10.0000 mL | ORAL_SOLUTION | Freq: Four times a day (QID) | ORAL | 0 refills | Status: DC | PRN

## 2024-09-08 MED ORDER — MUCINEX DM MAXIMUM STRENGTH 60-1200 MG PO TB12: 1.0000 | ORAL_TABLET | Freq: Two times a day (BID) | ORAL | 0 refills | Status: DC

## 2024-09-08 NOTE — ED Notes (Signed)
 Declines covid and flu testing.

## 2024-09-08 NOTE — ED Triage Notes (Signed)
 Pt reports nasal/chest congestion and productive cough x 5 days. Notes fevers and chills, unsure of max temp. Only taking tylenol  and ibuprofen  at home. Pt reports having respiratory symptoms off and on for the last two months. It keeps resolving and coming back on. No sick contacts. Last antibiotic was amoxicillin  and it was finished 3 weeks ago.

## 2024-09-08 NOTE — Discharge Instructions (Addendum)

## 2024-09-08 NOTE — ED Provider Notes (Signed)
 [facility]    CSN: [id number] Arrival date & time: 09/08/24  1921      History   Chief Complaint Chief Complaint  Patient presents with   Nasal Congestion   Cough    HPI Holly Wagner is a 33 y.o. female.   Discussed the use of AI scribe software for clinical note transcription with the patient, who gave verbal consent to proceed.   The patient presents with upper respiratory symptoms that have been occurring on and off for the past couple of months, with the current episode lasting approximately 5 days. She reports that the symptoms come and go, getting better and then returning. She has received 2 to 3 antibiotics over the last 2 months, but the symptoms keep coming back. During her last visit, she was diagnosed with an ear infection as well.  Current symptoms include fevers, chills, nasal congestion, chest congestion, and cough with a ton of mucus coming up described as a crap ton of green mucus. She experiences sneezing and runny nose, but reports minimal sore throat except in the mornings when everything is fresh, which improves throughout the day. She notes definite drainage overnight. She reports some wheezing and shortness of breath, and has a history of asthma for which she uses a nebulizer at home as needed. She experiences headaches from tension. She feels hot and sweaty, though she states she does not have a fever but feels feverish.  For symptom management, she has been using her nebulizer as needed and taking Tylenol  and ibuprofen , stating she couldn't get anything else due to financial constraints. She vapes but has not done so for the last several days. She does not tolerate steroids well. She denies nausea, vomiting, or diarrhea.   The following sections of the patient's history were reviewed and updated as appropriate: allergies, current medications, past family history, past medical history, past social history, past surgical history, and  problem list.       Past Medical History:  Diagnosis Date   ADD (attention deficit disorder)    Anxiety and depression    Asthma    Bipolar 1 disorder (HCC)    Dumping syndrome    GERD (gastroesophageal reflux disease)    Mental disorder    PONV (postoperative nausea and vomiting)    Pre-diabetes     Patient Active Problem List   Diagnosis Date Noted   Pain of left hand 07/07/2024   Tobacco abuse 02/13/2024   IUD (intrauterine device) in place 10/29/2023   At risk for sleep apnea 10/21/2023   Seizure-like activity (HCC) 10/21/2023   Chronic constipation 10/08/2023   Essential hypertension 10/08/2023   Hydrosalpinx 07/24/2023   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   Allergic rhinitis 06/12/2023   Asthma 06/12/2023   Bipolar disorder (HCC) 06/12/2023   Eczema 06/12/2023   Migraine without aura and without status migrainosus, not intractable 06/12/2023   Right ovarian cyst 06/12/2023   Mixed stress and urge incontinence 06/12/2023   Morbid obesity (HCC) 06/12/2023   Controlled type 2 diabetes mellitus without complication, without long-term current use of insulin  (HCC) 08/27/2022   Chronic tonsillitis 01/29/2022   Enlarged tonsils 01/29/2022   Throat pain in adult 01/29/2022   Dizziness 06/26/2021   Vertigo 06/26/2021   Tinnitus, bilateral 06/26/2021   Cyclical vomiting syndrome 02/16/2021   Gastroenteritis 02/16/2021   Metabolic acidosis with normal anion gap and bicarbonate losses 02/16/2021   Abnormal involuntary movement 10/13/2020   Tremor of face and hands 10/13/2020  Marijuana use 11/16/2018   Chronic diarrhea 08/14/2018   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   Gastroesophageal reflux disease without esophagitis 08/14/2018   Bipolar disorder in partial remission 01/08/2016   Evaluation regarding contraception options 01/08/2016   Fetal hydronephrosis in pregnancy, antepartum condition 12/28/2015   Group B Streptococcus carrier, +RV culture,  currently pregnant 12/24/2015   Gestational diabetes mellitus (GDM) affecting pregnancy 12/21/2015   Irregular contractions 10/31/2015   Indication for care in labor and delivery, antepartum 09/09/2015   Encounter for IUD removal 05/03/2014   Oral contraceptive prescribed 05/03/2014   Skin tag of vaginal mucosa 05/03/2014   Vaginal discharge 05/03/2014    Past Surgical History:  Procedure Laterality Date   BILATERAL SALPINGECTOMY Bilateral 04/13/2021   Procedure: BILATERAL SALPINGECTOMY;  Surgeon: Lovetta, Debby PARAS, MD;  Location: [facility];  Service: Gynecology;  Laterality: Bilateral;   CHOLECYSTECTOMY  2007   fatty tumor removal  1992   IUD REMOVAL N/A 04/13/2021   Procedure: INTRAUTERINE DEVICE (IUD) REMOVAL;  Surgeon: Schermerhorn, Debby PARAS, MD;  Location: [facility];  Service: Gynecology;  Laterality: N/A;   SINUS SURGERY WITH INSTATRAK     unsure, maybe 4 years ago   VAGINAL HYSTERECTOMY N/A 04/13/2021   Procedure: HYSTERECTOMY VAGINAL;  Surgeon: Schermerhorn, Debby PARAS, MD;  Location: [facility];  Service: Gynecology;  Laterality: N/A;   WISDOM TOOTH EXTRACTION      OB History     Gravida  2   Para  1   Term  1   Preterm      AB      Living  1      SAB      IAB      Ectopic      Multiple      Live Births               Home Medications    Prior to Admission medications   Medication Sig Start Date End Date Taking? Authorizing Provider  Accu-Chek Softclix Lancets lancets  01/17/23  Yes [provider]  albuterol  (PROVENTIL ) (2.5 MG/3ML) 0.083% nebulizer solution Take 2.5 mg by nebulization every 6 (six) hours as needed for wheezing or shortness of breath.   Yes [provider]  albuterol  (VENTOLIN  HFA) 108 (90 Base) MCG/ACT inhaler Inhale 2 puffs into the lungs every 6 (six) hours as needed for wheezing or shortness of breath. 06/07/23  Yes Angelena Smalls, MD  amoxicillin -clavulanate (AUGMENTIN ) 875-125 MG tablet Take 1 tablet by mouth  every 12 (twelve) hours. 04/04/24  Yes Towana Ozell BROCKS, MD  atorvastatin  (LIPITOR) 20 MG tablet Take 1 tablet by mouth at bedtime. 10/08/23 10/07/24 Yes [provider]  Blood Glucose Monitoring Suppl (ACCU-CHEK GUIDE ME) w/Device KIT 2 (two) times daily. 08/15/24  Yes [provider]  brompheniramine-pseudoephedrine-DM 30-2-10 MG/5ML syrup Take 10 mLs by mouth every 6 (six) hours as needed (cough and congestion). 09/08/24  Yes Iola Lukes, FNP  Dextromethorphan-guaiFENesin  (MUCINEX  DM MAXIMUM STRENGTH) 60-1200 MG TB12 Take 1 tablet by mouth 2 (two) times daily. 09/08/24  Yes Iola Lukes, FNP  DULoxetine (CYMBALTA) 60 MG capsule Take 1 capsule by mouth daily. 08/26/23 09/08/24 Yes [provider]  hydrOXYzine (ATARAX) 25 MG tablet Take 25 mg by mouth. 07/31/24  Yes [provider]  levocetirizine (XYZAL ) 5 MG tablet Take 1 tablet (5 mg total) by mouth at bedtime. 09/08/24  Yes Iola Lukes, FNP  Montelukast Sodium (SINGULAIR PO)    Yes [provider]  nicotine  (  NICODERM CQ  - DOSED IN MG/24 HOURS) 21 mg/24hr patch 21 mg daily. 08/20/24  Yes [provider]  nystatin (MYCOSTATIN/NYSTOP) powder APPLY POWDER TOPICALLY TO AFFECTED AREA TWICE DAILY 07/14/24  Yes [provider]  OZEMPIC, 0.25 OR 0.5 MG/DOSE, 2 MG/3ML SOPN Inject by subcutaneous route.   Yes [provider]  pantoprazole (PROTONIX) 40 MG tablet Take 40 mg by mouth daily. 05/11/24  Yes [provider]  traZODone (DESYREL) 50 MG tablet Take 50 mg by mouth at bedtime.   Yes [provider]  valsartan (DIOVAN) 80 MG tablet Take 1 tablet by mouth daily. 10/08/23 10/07/24 Yes [provider]  Vitamin D, Ergocalciferol, (DRISDOL) 1.25 MG (50000 UNIT) CAPS capsule Take 50,000 Units by mouth once a week. 05/11/24  Yes [provider]  VRAYLAR 3 MG capsule Take 3 mg by mouth daily. 07/31/24  Yes [provider]  cephALEXin  (KEFLEX) 500 MG capsule Take 500 mg by mouth 2 (two) times daily. Patient not taking: Reported on 09/08/2024 07/14/24   [provider]  colestipol (COLESTID) 1 g tablet Take 1 g by mouth at bedtime. Patient not taking: Reported on 05/31/2024 01/20/21   [provider]  cyclobenzaprine  (FLEXERIL ) 10 MG tablet Take 1 tablet (10 mg total) by mouth 2 (two) times daily as needed for muscle spasms. Patient not taking: Reported on 09/08/2024 05/31/24   Billy Asberry FALCON, PA-C  fluconazole  (DIFLUCAN ) 150 MG tablet Take 1 tablet (150 mg total) by mouth daily. Patient not taking: Reported on 05/31/2024 04/04/24   Butler, Michael C, MD  fluticasone (FLONASE) 50 MCG/ACT nasal spray Place 1 spray into both nostrils daily as needed for allergies. 01/24/21   [provider]  folic acid (FOLVITE) 400 MCG tablet Take 400 mcg by mouth daily.    [provider]  metFORMIN (GLUCOPHAGE) 500 MG tablet Take 500 mg by mouth 2 (two) times daily. Patient not taking: Reported on 09/08/2024    [provider]  Sennosides (SENNA-TIME PO)     [provider]  VRAYLAR 1.5 MG capsule TAKE 1 CAPSULE BY MOUTH ONCE DAILY FOR MOOD Patient not taking: Reported on 09/08/2024 07/05/24   [provider]    Family History Family History  Problem Relation Age of Onset   Cancer Other     Social History Social History   Tobacco Use   Smoking status: Former    Current packs/day: 0.00    Types: Cigarettes    Quit date: 04/30/2015    Years since quitting: 9.3   Smokeless tobacco: Never  Vaping Use   Vaping status: Every Day   Substances: Nicotine , Flavoring  Substance Use Topics   Alcohol use: Yes    Comment: Socially.,   Drug use: Yes    Types: Marijuana    Comment: Occassional     Allergies   Morphine , Morphine  and codeine, Tizanidine, Buspirone, and Prednisone    Review of Systems Review of Systems  Constitutional:  Positive for chills and fever (subjective).   HENT:  Positive for congestion, rhinorrhea, sneezing and sore throat (mainly in the mornings and gets better throughout the day).   Respiratory:  Positive for cough (productive), shortness of breath (some, intermittently) and wheezing (some, intermittently).        Chest congestion   Gastrointestinal:  Negative for diarrhea, nausea and vomiting.  Neurological:  Positive for headaches.  All other systems reviewed and are negative.    Physical Exam Triage Vital Signs ED Triage Vitals [09/08/24 1930]  Encounter Vitals Group     BP 117/81     Girls Systolic BP Percentile      Girls Diastolic BP Percentile      Boys Systolic BP Percentile      Boys Diastolic BP Percentile      Pulse Rate 80     Resp 16     Temp 98.6 F (37 C)     Temp Source Oral     SpO2 97 %     Weight      Height      Head Circumference      Peak Flow      Pain Score 0     Pain Loc      Pain Education      Exclude from Growth Chart    No data found.  Updated Vital Signs BP 117/81 (BP Location: Left Arm)   Pulse 80   Temp 98.6 F (37 C) (Oral)   Resp 16   LMP 04/12/2021 (Exact Date)   SpO2 97%   Visual Acuity Right Eye Distance:   Left Eye Distance:   Bilateral Distance:    Right Eye Near:   Left Eye Near:    Bilateral Near:     Physical Exam Vitals reviewed.  Constitutional:      General: She is awake. She is not in acute distress.    Appearance: Normal appearance. She is well-developed. She is not ill-appearing, toxic-appearing or diaphoretic.  HENT:     Head: Normocephalic.     Right Ear: Tympanic membrane, ear canal and external ear normal. No drainage, swelling or tenderness. No middle ear effusion. Tympanic membrane is not erythematous.     Left Ear: Tympanic membrane, ear canal and external ear normal. No drainage, swelling or tenderness.  No middle ear effusion. Tympanic membrane is not erythematous.     Nose: Congestion present. No rhinorrhea.     Mouth/Throat:     Lips: Pink.      Mouth: Mucous membranes are moist.     Pharynx: No pharyngeal swelling, oropharyngeal exudate, posterior oropharyngeal erythema or uvula swelling.     Tonsils: No tonsillar exudate or tonsillar abscesses.  Eyes:     General: Vision grossly intact.     Conjunctiva/sclera: Conjunctivae normal.  Cardiovascular:     Rate and Rhythm: Normal rate.     Heart sounds: Normal heart sounds.  Pulmonary:     Effort: Pulmonary effort is normal. No tachypnea or respiratory distress.     Breath sounds: Normal breath sounds and air entry.     Comments: Congested-sounding cough observed. Lungs are clear to auscultation bilaterally with no adventitious sounds appreciated. Respirations are even and unlabored, and the patient is in no acute distress. Musculoskeletal:        General: Normal range of motion.     Cervical back: Normal range of motion and neck supple.  Lymphadenopathy:     Cervical: No cervical adenopathy.  Skin:    General: Skin is warm and dry.  Neurological:     General: No focal deficit present.     Mental Status: She is alert and oriented to person, place, and time.  Psychiatric:        Behavior: Behavior is cooperative.      UC Treatments / Results  Labs (all labs ordered are listed, but only abnormal results are displayed) Labs Reviewed - No data to display  EKG   Radiology No results found.  Procedures Procedures (including critical care  time)  Medications Ordered in UC Medications - No data to display  Initial Impression / Assessment and Plan / UC Course  I have reviewed the triage vital signs and the nursing notes.  Pertinent labs & imaging results that were available during my care of the patient were reviewed by me and considered in my medical decision making (see chart for details).     The patient presents with symptoms consistent with a viral upper respiratory infection with mild intermittent asthma exacerbation. Exam is reassuring and no evidence of  bacterial infection or acute cardiopulmonary process is noted. Supportive care is recommended. Patient was advised to follow up with primary care if symptoms do not improve within one week or if new concerns arise. Instructions were given to seek emergency care if symptoms worsen, including shortness of breath, chest pain, persistent high fever, inability to tolerate fluids, or confusion.  Today's evaluation has revealed no signs of a dangerous process. Discussed diagnosis with patient and/or guardian. Patient and/or guardian aware of their diagnosis, possible red flag symptoms to watch out for and need for close follow up. Patient and/or guardian understands verbal and written discharge instructions. Patient and/or guardian comfortable with plan and disposition.  Patient and/or guardian has a clear mental status at this time, good insight into illness (after discussion and teaching) and has clear judgment to make decisions regarding their care  Documentation was completed with the aid of voice recognition software. Transcription may contain typographical errors.   Final Clinical Impressions(s) / UC Diagnoses   Final diagnoses:  Viral upper respiratory tract infection  Mild intermittent asthma with acute exacerbation     Discharge Instructions      Your symptoms are most likely caused by a respiratory infection, which affects areas like your nose, throat, or lungs. This type of infection is usually caused by a virus. Since your illness is caused by a virus, antibiotics won't help because they only treat infections caused by bacteria. Take the medications that were prescribed to you as directed. If you have a fever, headache, or body aches, you can also take Tylenol  or ibuprofen  to help you feel more comfortable. Be sure to drink plenty of fluids to stay hydrated--aim for enough to keep your urine a pale yellow color. This will also help to thin mucus and make it easier to clear from your body.   Using a cool mist humidifier at home to keep humidity levels above 50% can be helpful. You can also inhale steam for 10 to 15 minutes, 3 to 4 times a day. This can be done by sitting in the bathroom with a hot shower running, or by using over-the-counter vapor shower tablets to help with nasal congestion. Try to avoid cool or dry air as much as possible. When you sleep, keep your head elevated to help reduce post-nasal drainage. Be sure to get enough rest every night to support your recovery.Don't forget to replace your toothbrush once you start feeling better.  It's normal for a cough to linger for several weeks after a respiratory illness, even after other symptoms have resolved. This happens because the airways remain irritated and take time to fully heal. As long as the cough gradually improves and there are no new concerning symptoms, this is part of the normal recovery process. If your symptoms get worse or if you develop any new or concerning symptoms, go to the emergency room right away. If you're not feeling better in a few days, follow up with your  primary care provider.            ED Prescriptions     Medication Sig Dispense Auth. Provider   Dextromethorphan-guaiFENesin  (MUCINEX  DM MAXIMUM STRENGTH) 60-1200 MG TB12 Take 1 tablet by mouth 2 (two) times daily. 20 tablet Iola Lukes, FNP   brompheniramine-pseudoephedrine-DM 30-2-10 MG/5ML syrup Take 10 mLs by mouth every 6 (six) hours as needed (cough and congestion). 120 mL Iola Lukes, FNP   levocetirizine (XYZAL ) 5 MG tablet Take 1 tablet (5 mg total) by mouth at bedtime. 10 tablet Iola Lukes, FNP      PDMP not reviewed this encounter.   Iola Lukes, [place] 09/08/24 [phone]

## 2024-10-22 ENCOUNTER — Encounter: Payer: Self-pay | Admitting: *Deleted

## 2024-10-22 ENCOUNTER — Other Ambulatory Visit: Payer: Self-pay

## 2024-10-22 ENCOUNTER — Ambulatory Visit
Admission: EM | Admit: 2024-10-22 | Discharge: 2024-10-22 | Disposition: A | Discharge status: Home / Self Care | Payer: MEDICAID | Attending: Family Medicine | Admitting: Family Medicine

## 2024-10-22 DIAGNOSIS — J4521 Mild intermittent asthma with (acute) exacerbation: Secondary | ICD-10-CM

## 2024-10-22 DIAGNOSIS — J0191 Acute recurrent sinusitis, unspecified: Secondary | ICD-10-CM

## 2024-10-22 MED ORDER — FLUTICASONE PROPIONATE 50 MCG/ACT NA SUSP: 2.0000 | Freq: Every day | NASAL | 0 refills | Status: AC

## 2024-10-22 MED ORDER — AMOXICILLIN-POT CLAVULANATE 875-125 MG PO TABS: 1.0000 | ORAL_TABLET | Freq: Two times a day (BID) | ORAL | 0 refills | Status: AC

## 2024-10-22 MED ORDER — ACETAMINOPHEN 325 MG PO TABS
975.0000 mg | ORAL_TABLET | Freq: Once | ORAL | Status: AC
  Administered 2024-10-22: 975 mg via ORAL

## 2024-10-22 MED ORDER — BENZONATATE 100 MG PO CAPS: 100.0000 mg | ORAL_CAPSULE | Freq: Three times a day (TID) | ORAL | 0 refills | Status: DC | PRN

## 2024-10-22 NOTE — ED Triage Notes (Addendum)
 C/O productive cough, vomiting large amounts of mucus, tactile fever, rhinorrhea, nasal congestion onset approx 1 wk ago. States is able to keep down PO fluids. C/O slight nausea at present. Has been taking Robitussin and using albuterol  neb.  I have had this same thing off & on for 3 months and have had 2 courses of abx, so my PCP is sending me to a specialist.  Declines ondansetron  at this time.

## 2024-10-22 NOTE — ED Provider Notes (Signed)
 [facility]    CSN: [id number] Arrival date & time: 10/22/24  1027      History   Chief Complaint Chief Complaint  Patient presents with   Cough    HPI Holly Wagner is a 33 y.o. female.    Cough  Here for nasal congestion and postnasal drainage and rhinorrhea and cough.  Symptoms got worse about 1 week ago.  She has felt like she had some fever but has not gone to check it with her mom and her. She actually has had waxing and waning symptoms like this for about 3 months.  She has taken doxycycline  and Keflex.  When she takes antibiotics she does improve while on them.  But then after finishing antibiotics she will have some lingering postnasal drainage and cough.  Her primary care is going to be sending her to an allergy specialist.  She is already taking Singulair and Zyrtec.  She is not taking Flonase yet  She is sensitive to prednisone  which causes a lot of anxiety and crying.  She also is sensitive to morphine  and codeine and tizanidine and buspirone.  Last menstrual cycle was in 2022, when she had a hysterectomy  She does have a history of asthma and has been wheezing.  She has albuterol  and nebulizer at home.   Past Medical History:  Diagnosis Date   ADD (attention deficit disorder)    Anxiety and depression    Asthma    Bipolar 1 disorder (HCC)    Dumping syndrome    GERD (gastroesophageal reflux disease)    Mental disorder    PONV (postoperative nausea and vomiting)    Pre-diabetes     Patient Active Problem List   Diagnosis Date Noted   Pain of left hand 07/07/2024   Tobacco abuse 02/13/2024   IUD (intrauterine device) in place 10/29/2023   At risk for sleep apnea 10/21/2023   Seizure-like activity (HCC) 10/21/2023   Chronic constipation 10/08/2023   Essential hypertension 10/08/2023   Hydrosalpinx 07/24/2023   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   Allergic rhinitis 06/12/2023   Asthma 06/12/2023   Bipolar disorder (HCC)  06/12/2023   Eczema 06/12/2023   Migraine without aura and without status migrainosus, not intractable 06/12/2023   Right ovarian cyst 06/12/2023   Mixed stress and urge incontinence 06/12/2023   Morbid obesity (HCC) 06/12/2023   Controlled type 2 diabetes mellitus without complication, without long-term current use of insulin  (HCC) 08/27/2022   Chronic tonsillitis 01/29/2022   Enlarged tonsils 01/29/2022   Throat pain in adult 01/29/2022   Dizziness 06/26/2021   Vertigo 06/26/2021   Tinnitus, bilateral 06/26/2021   Cyclical vomiting syndrome 02/16/2021   Gastroenteritis 02/16/2021   Metabolic acidosis with normal anion gap and bicarbonate losses 02/16/2021   Abnormal involuntary movement 10/13/2020   Tremor of face and hands 10/13/2020   Marijuana use 11/16/2018   Chronic diarrhea 08/14/2018   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   Gastroesophageal reflux disease without esophagitis 08/14/2018   Bipolar disorder in partial remission 01/08/2016   Evaluation regarding contraception options 01/08/2016   Fetal hydronephrosis in pregnancy, antepartum condition 12/28/2015   Group B Streptococcus carrier, +RV culture, currently pregnant 12/24/2015   Gestational diabetes mellitus (GDM) affecting pregnancy 12/21/2015   Irregular contractions 10/31/2015   Indication for care in labor and delivery, antepartum 09/09/2015   Encounter for IUD removal 05/03/2014   Oral contraceptive prescribed 05/03/2014   Skin tag of vaginal mucosa 05/03/2014   Vaginal discharge  05/03/2014    Past Surgical History:  Procedure Laterality Date   BILATERAL SALPINGECTOMY Bilateral 04/13/2021   Procedure: BILATERAL SALPINGECTOMY;  Surgeon: Schermerhorn, Debby PARAS, MD;  Location: [facility];  Service: Gynecology;  Laterality: Bilateral;   CHOLECYSTECTOMY  2007   fatty tumor removal  1992   IUD REMOVAL N/A 04/13/2021   Procedure: INTRAUTERINE DEVICE (IUD) REMOVAL;  Surgeon: Schermerhorn, Debby PARAS, MD;   Location: [facility];  Service: Gynecology;  Laterality: N/A;   SINUS SURGERY WITH INSTATRAK     unsure, maybe 4 years ago   VAGINAL HYSTERECTOMY N/A 04/13/2021   Procedure: HYSTERECTOMY VAGINAL;  Surgeon: Schermerhorn, Debby PARAS, MD;  Location: [facility];  Service: Gynecology;  Laterality: N/A;   WISDOM TOOTH EXTRACTION      OB History     Gravida  2   Para  1   Term  1   Preterm      AB      Living  1      SAB      IAB      Ectopic      Multiple      Live Births               Home Medications    Prior to Admission medications   Medication Sig Start Date End Date Taking? Authorizing Provider  albuterol  (PROVENTIL ) (2.5 MG/3ML) 0.083% nebulizer solution Take 2.5 mg by nebulization every 6 (six) hours as needed for wheezing or shortness of breath.   Yes [provider]  amoxicillin -clavulanate (AUGMENTIN ) 875-125 MG tablet Take 1 tablet by mouth 2 (two) times daily for 7 days. 10/22/24 10/29/24 Yes Wataru Mccowen, Sharlet POUR, MD  atorvastatin  (LIPITOR) 20 MG tablet Take 1 tablet by mouth at bedtime. 10/08/23 10/22/24 Yes [provider]  benzonatate  (TESSALON ) 100 MG capsule Take 1 capsule (100 mg total) by mouth 3 (three) times daily as needed for cough. 10/22/24  Yes Aniko Finnigan K, MD  DULoxetine (CYMBALTA) 60 MG capsule Take 1 capsule by mouth daily. 08/26/23 10/22/24 Yes [provider]  fluticasone (FLONASE) 50 MCG/ACT nasal spray Place 2 sprays into both nostrils daily. 10/22/24  Yes Gurtaj Ruz K, MD  hydrOXYzine (ATARAX) 25 MG tablet Take 25 mg by mouth. 07/31/24  Yes [provider]  Montelukast Sodium (SINGULAIR PO)    Yes [provider]  pantoprazole (PROTONIX) 40 MG tablet Take 40 mg by mouth daily. 05/11/24  Yes [provider]  traZODone (DESYREL) 50 MG tablet Take 50 mg by mouth at bedtime.   Yes [provider]  valsartan (DIOVAN) 80 MG tablet Take 1 tablet by mouth daily. 10/08/23 10/22/24 Yes  [provider]  Vitamin D, Ergocalciferol, (DRISDOL) 1.25 MG (50000 UNIT) CAPS capsule Take 50,000 Units by mouth once a week. 05/11/24  Yes [provider]  VRAYLAR 3 MG capsule Take 3 mg by mouth daily. 07/31/24  Yes [provider]  Accu-Chek Softclix Lancets lancets  01/17/23   [provider]  albuterol  (VENTOLIN  HFA) 108 (90 Base) MCG/ACT inhaler Inhale 2 puffs into the lungs every 6 (six) hours as needed for wheezing or shortness of breath. 06/07/23   Angelena Smalls, MD  Blood Glucose Monitoring Suppl (ACCU-CHEK GUIDE ME) w/Device KIT 2 (two) times daily. 08/15/24   [provider]  nicotine  (NICODERM CQ  - DOSED IN MG/24 HOURS) 21 mg/24hr patch 21 mg daily. 08/20/24   [provider]  nystatin (MYCOSTATIN/NYSTOP) powder APPLY POWDER TOPICALLY TO AFFECTED AREA TWICE  DAILY 07/14/24   [provider]  Sennosides (SENNA-TIME PO)     [provider]    Family History Family History  Problem Relation Age of Onset   Cancer Other     Social History Social History   Tobacco Use   Smoking status: Former    Current packs/day: 0.00    Types: Cigarettes    Quit date: 04/30/2015    Years since quitting: 9.4   Smokeless tobacco: Never  Vaping Use   Vaping status: Every Day   Substances: Nicotine , Flavoring  Substance Use Topics   Alcohol use: Yes    Comment: Socially   Drug use: Yes    Types: Marijuana    Comment: occasionally     Allergies   Morphine , Morphine  and codeine, Tizanidine, Buspirone, and Prednisone    Review of Systems Review of Systems  Respiratory:  Positive for cough.      Physical Exam Triage Vital Signs ED Triage Vitals  Encounter Vitals Group     BP 10/22/24 1055 118/82     Girls Systolic BP Percentile --      Girls Diastolic BP Percentile --      Boys Systolic BP Percentile --      Boys Diastolic BP Percentile --      Pulse Rate 10/22/24 1055 87     Resp 10/22/24 1055 20     Temp  10/22/24 1055 97.9 F (36.6 C)     Temp Source 10/22/24 1055 Oral     SpO2 10/22/24 1055 94 %     Weight --      Height --      Head Circumference --      Peak Flow --      Pain Score 10/22/24 1057 0     Pain Loc --      Pain Education --      Exclude from Growth Chart --    No data found.  Updated Vital Signs BP 118/82   Pulse 87   Temp 97.9 F (36.6 C) (Oral)   Resp 20   LMP 04/12/2021 (Exact Date)   SpO2 94%   Visual Acuity Right Eye Distance:   Left Eye Distance:   Bilateral Distance:    Right Eye Near:   Left Eye Near:    Bilateral Near:     Physical Exam Vitals reviewed.  Constitutional:      General: She is not in acute distress.    Appearance: She is not ill-appearing, toxic-appearing or diaphoretic.  HENT:     Right Ear: Tympanic membrane and ear canal normal.     Left Ear: Tympanic membrane and ear canal normal.     Nose: Congestion present.     Mouth/Throat:     Mouth: Mucous membranes are moist.     Comments: There is a lot of clear drainage Eyes:     Extraocular Movements: Extraocular movements intact.     Conjunctiva/sclera: Conjunctivae normal.     Pupils: Pupils are equal, round, and reactive to light.  Cardiovascular:     Rate and Rhythm: Normal rate and regular rhythm.     Heart sounds: No murmur heard. Pulmonary:     Effort: No respiratory distress.     Breath sounds: No stridor. No wheezing, rhonchi or rales.     Comments: At this time she is not wheezing. Musculoskeletal:     Cervical back: Neck supple.  Lymphadenopathy:     Cervical: No cervical adenopathy.  Skin:  Capillary Refill: Capillary refill takes less than 2 seconds.     Coloration: Skin is not jaundiced or pale.  Neurological:     General: No focal deficit present.     Mental Status: She is alert and oriented to person, place, and time.  Psychiatric:        Behavior: Behavior normal.      UC Treatments / Results  Labs (all labs ordered are listed, but only  abnormal results are displayed) Labs Reviewed - No data to display  EKG   Radiology No results found.  Procedures Procedures (including critical care time)  Medications Ordered in UC Medications  acetaminophen  (TYLENOL ) tablet 975 mg (975 mg Oral Given 10/22/24 1102)    Initial Impression / Assessment and Plan / UC Course  I have reviewed the triage vital signs and the nursing notes.  Pertinent labs & imaging results that were available during my care of the patient were reviewed by me and considered in my medical decision making (see chart for details).     Augmentin  is sent in to treat what appears to be an acute exacerbation of some chronic sinusitis.  We discussed considering a 5-day burst of prednisone  even if we did a low-dose, but she is very hesitant as it causes severe side effects for her.  Tessalon  Perles were sent in for her cough and she is going to try Flonase, which should not cause her any severe side effects.  She will follow-up with primary care and her new specialist. Final Clinical Impressions(s) / UC Diagnoses   Final diagnoses:  Acute recurrent sinusitis, unspecified location  Mild intermittent asthma with acute exacerbation     Discharge Instructions      Take amoxicillin -clavulanate 875 mg--1 tab twice daily with food for 7 days  Take benzonatate  100 mg, 1 tab every 8 hours as needed for cough.  Fluticasone/Flonase nose spray--put 2 sprays in each nostril once daily  Rinse your nose 3 times a day with over-the-counter saline no spray.  Keep your follow-up with primary care and your specialist.     ED Prescriptions     Medication Sig Dispense Auth. Provider   amoxicillin -clavulanate (AUGMENTIN ) 875-125 MG tablet Take 1 tablet by mouth 2 (two) times daily for 7 days. 14 tablet Kendon Sedeno K, MD   fluticasone [facility]) 50 MCG/ACT nasal spray Place 2 sprays into both nostrils daily. 16 g Vonna Sharlet POUR, MD   benzonatate  (TESSALON )  100 MG capsule Take 1 capsule (100 mg total) by mouth 3 (three) times daily as needed for cough. 21 capsule Jakeim Sedore K, MD      PDMP not reviewed this encounter.   Vonna Sharlet POUR, MD 10/22/24 [phone]

## 2024-10-22 NOTE — Discharge Instructions (Signed)
 Take amoxicillin -clavulanate 875 mg--1 tab twice daily with food for 7 days  Take benzonatate  100 mg, 1 tab every 8 hours as needed for cough.  Fluticasone/Flonase nose spray--put 2 sprays in each nostril once daily  Rinse your nose 3 times a day with over-the-counter saline no spray.  Keep your follow-up with primary care and your specialist.

## 2024-11-04 NOTE — Progress Notes (Deleted)
 Office Visit Note  Patient: Holly Wagner             Date of Birth: 11-19-1991           MRN: [id number]             PCP: Jori Small, FNP Referring: Jori Small, FNP Visit Date: 11/05/2024 Occupation: @GUAROCC @  Subjective:  No chief complaint on file.    History of Present Illness: Holly Wagner is a 33 y.o. female ***   Activities of Daily Living:  Patient reports morning stiffness for *** {minute/hour:19697}.   Patient {ACTIONS;DENIES/REPORTS:21021675::Denies} nocturnal pain.  Difficulty dressing/grooming: {ACTIONS;DENIES/REPORTS:21021675::Denies} Difficulty climbing stairs: {ACTIONS;DENIES/REPORTS:21021675::Denies} Difficulty getting out of chair: {ACTIONS;DENIES/REPORTS:21021675::Denies} Difficulty using hands for taps, buttons, cutlery, and/or writing: {ACTIONS;DENIES/REPORTS:21021675::Denies}  No Rheumatology ROS completed.    Rheum History: # Diagnosed in ***.  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   Allergic rhinitis 06/12/2023   Asthma 06/12/2023   Bipolar disorder (HCC) 06/12/2023   Eczema 06/12/2023   Migraine without aura and without status migrainosus, not intractable 06/12/2023   Right ovarian cyst 06/12/2023   Mixed stress and urge incontinence 06/12/2023   Morbid obesity (HCC) 06/12/2023   Controlled type 2 diabetes mellitus without complication, without long-term current use of insulin  (HCC)  08/27/2022   Chronic tonsillitis 01/29/2022   Enlarged tonsils 01/29/2022   Throat pain in adult 01/29/2022   Dizziness 06/26/2021   Vertigo 06/26/2021   Tinnitus, bilateral 06/26/2021   Cyclical vomiting syndrome 02/16/2021   Gastroenteritis 02/16/2021   Metabolic acidosis with normal anion gap and bicarbonate losses 02/16/2021   Abnormal involuntary movement 10/13/2020   Tremor of face and hands 10/13/2020   Marijuana use 11/16/2018   Chronic diarrhea 08/14/2018   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   Gastroesophageal reflux disease without esophagitis 08/14/2018   Bipolar disorder in partial remission 01/08/2016   Evaluation regarding contraception options 01/08/2016   Fetal hydronephrosis in pregnancy, antepartum condition 12/28/2015   Group B Streptococcus carrier, +RV culture, currently pregnant 12/24/2015   Gestational diabetes mellitus (GDM) affecting pregnancy 12/21/2015   Irregular contractions 10/31/2015   Indication for care in labor and delivery, antepartum 09/09/2015   Encounter for IUD removal 05/03/2014   Oral contraceptive prescribed 05/03/2014   Skin tag of vaginal mucosa 05/03/2014   Vaginal discharge 05/03/2014    Past Medical History:  Diagnosis Date   ADD (attention deficit disorder)    Anxiety and depression    Asthma    Bipolar 1 disorder (HCC)    Dumping syndrome    GERD (gastroesophageal reflux disease)    Mental disorder    PONV (postoperative nausea and vomiting)    Pre-diabetes     Family History  Problem Relation Age of Onset   Cancer Other    Past Surgical History:  Procedure Laterality Date   BILATERAL SALPINGECTOMY Bilateral 04/13/2021   Procedure: BILATERAL SALPINGECTOMY;  Surgeon: Schermerhorn, Debby PARAS, MD;  Location: [facility];  Service: Gynecology;  Laterality:  Bilateral;   CHOLECYSTECTOMY  2007   fatty tumor removal  1992   IUD REMOVAL N/A 04/13/2021   Procedure: INTRAUTERINE DEVICE (IUD) REMOVAL;  Surgeon:  Schermerhorn, Debby PARAS, MD;  Location: [facility];  Service: Gynecology;  Laterality: N/A;   SINUS SURGERY WITH INSTATRAK     unsure, maybe 4 years ago   VAGINAL HYSTERECTOMY N/A 04/13/2021   Procedure: HYSTERECTOMY VAGINAL;  Surgeon: Schermerhorn, Debby PARAS, MD;  Location: [facility];  Service: Gynecology;  Laterality: N/A;   WISDOM TOOTH EXTRACTION     Social History   Social History Narrative   Not on file   Immunization History  Administered Date(s) Administered   PFIZER Comirnaty(Gray Top)Covid-19 Tri-Sucrose Vaccine 03/08/2021     Objective: Vital Signs: LMP 04/12/2021 (Exact Date)    Physical Exam Vitals and nursing note reviewed.  HENT:     Head: Normocephalic and atraumatic.     Nose: Nose normal.  Eyes:     Conjunctiva/sclera: Conjunctivae normal.     Pupils: Pupils are equal, round, and reactive to light.  Cardiovascular:     Rate and Rhythm: Normal rate and regular rhythm.     Heart sounds: Normal heart sounds.  Pulmonary:     Effort: Pulmonary effort is normal.     Breath sounds: Normal breath sounds.  Skin:    General: Skin is warm and dry.  Neurological:     Mental Status: She is alert. Mental status is at baseline.  Psychiatric:        Mood and Affect: Mood normal.        Behavior: Behavior normal.      Musculoskeletal Exam: ***  CDAI Exam: CDAI Score: -- Patient Global: --; Provider Global: -- Swollen: --; Tender: -- Joint Exam 11/05/2024   No joint exam has been documented for this visit   There is currently no information documented on the homunculus. Go to the Rheumatology activity and complete the homunculus joint exam.  Investigation: No additional findings.  Imaging: No results found.  Recent Labs: Lab Results  Component Value Date   WBC 7.7 04/04/2024   HGB 11.4 (L) 04/04/2024   PLT 296 04/04/2024   NA 138 04/04/2024   K 3.7 04/04/2024   CL 102 04/04/2024   CO2 31 04/04/2024   GLUCOSE 131 (H) 04/04/2024   BUN 10 04/04/2024    CREATININE 0.99 04/04/2024   BILITOT 0.4 04/04/2024   ALKPHOS 55 04/04/2024   AST 23 04/04/2024   ALT 21 04/04/2024   PROT 6.7 04/04/2024   ALBUMIN 4.1 04/04/2024   CALCIUM  8.8 (L) 04/04/2024   GFRAA >60 08/07/2018   No results found for: ANA, RF  Speciality Comments: No specialty comments available.  Procedures:  No procedures performed Allergies: Morphine , Morphine  and codeine, Tizanidine, Buspirone, and Prednisone    Assessment / Plan:     Visit Diagnoses: No diagnosis found.  #High risk medication use  Orders: No orders of the defined types were placed in this encounter.  No orders of the defined types were placed in this encounter.   I personally spent a total of *** minutes in the care of the patient today including {Time Based Coding:210964241}.  Follow-Up Instructions: No follow-ups on file.   Asberry Claw, DO

## 2024-11-05 ENCOUNTER — Ambulatory Visit: Payer: MEDICAID

## 2024-11-10 ENCOUNTER — Ambulatory Visit: Payer: MEDICAID | Admitting: Allergy

## 2024-11-19 ENCOUNTER — Other Ambulatory Visit: Payer: Self-pay

## 2024-11-19 ENCOUNTER — Encounter: Payer: Self-pay | Admitting: Allergy

## 2024-11-19 ENCOUNTER — Ambulatory Visit: Payer: MEDICAID | Admitting: Allergy

## 2024-11-19 VITALS — BP 112/78 | HR 85 | Temp 98.0°F | Resp 16 | Ht 66.75 in | Wt 226.6 lb

## 2024-11-19 DIAGNOSIS — J4530 Mild persistent asthma, uncomplicated: Secondary | ICD-10-CM | POA: Diagnosis not present

## 2024-11-19 DIAGNOSIS — B999 Unspecified infectious disease: Secondary | ICD-10-CM

## 2024-11-19 DIAGNOSIS — R053 Chronic cough: Secondary | ICD-10-CM | POA: Diagnosis not present

## 2024-11-19 DIAGNOSIS — K219 Gastro-esophageal reflux disease without esophagitis: Secondary | ICD-10-CM

## 2024-11-19 DIAGNOSIS — Z720 Tobacco use: Secondary | ICD-10-CM

## 2024-11-19 DIAGNOSIS — J3089 Other allergic rhinitis: Secondary | ICD-10-CM

## 2024-11-19 DIAGNOSIS — T50905A Adverse effect of unspecified drugs, medicaments and biological substances, initial encounter: Secondary | ICD-10-CM

## 2024-11-19 MED ORDER — CICLESONIDE 80 MCG/ACT IN AERS: 1.0000 | INHALATION_SPRAY | Freq: Two times a day (BID) | RESPIRATORY_TRACT | 3 refills | Status: AC

## 2024-11-19 MED ORDER — IPRATROPIUM BROMIDE 0.03 % NA SOLN: 1.0000 | Freq: Two times a day (BID) | NASAL | 5 refills | Status: AC | PRN

## 2024-11-19 NOTE — Progress Notes (Signed)
 New Patient Note  RE: Holly Wagner MRN: [id number] DOB: 10-23-1991 Date of Office Visit: 11/19/2024  Consult requested by: Jori Small, FNP Primary care provider: Jori Small, FNP  Chief Complaint: Establish Care (She says she had respiratory symptom - coughing with green and white mucus since 4 months then she was be treated with antibiotic 3-4 times.)  History of Present Illness: I had the pleasure of seeing Holly Wagner for initial evaluation at the [facility] on 11/19/2024. She is a 33 y.o. female, who is referred here by Jori Small, FNP for the evaluation of coughing.  Discussed the use of AI scribe software for clinical note transcription with the patient, who gave verbal consent to proceed.    She has been experiencing respiratory issues for approximately four months, characterized by persistent coughing and production of dark green and white foamy mucus. She has visited urgent care multiple times and has been prescribed three to four rounds of antibiotics, the most recent being four weeks ago, which provided only partial relief. Her symptoms have not fully resolved.  She has a history of asthma diagnosed in childhood, which had been well-controlled until the recent exacerbation. She uses an albuterol  inhaler as needed and Combivent inhaler one puff four times a day. She cannot tolerate prednisone  or steroid inhalers due to severe mental side effects, including psychosis-like symptoms. She has a nebulizer at home with albuterol  and duoneb, which she uses a couple of times a week, providing some relief in coughing up mucus.  She experiences daily symptoms including chest tightness, shortness of breath, and gets winded easily, especially when walking up and down stairs. She wakes up at night to cough up mucus and has visited urgent care three to four times in the past year for these issues. She has not been hospitalized for breathing  problems and has never had pneumonia.  She has a history of gastroesophageal reflux disease (GERD) and takes pantoprazole 40 mg once daily, which mostly controls her symptoms. She vapes daily.  She has eczema, particularly in her ears, causing itchiness. She has seasonal allergies with symptoms like runny nose and cough, particularly in winter, but has not undergone allergy testing. She takes montelukast but reports it does not help significantly.  Her past medical history includes dumping syndrome, fibromyalgia, and an undiagnosed autoimmune disease. She is in the process of seeing a rheumatologist and other specialists for further evaluation. She has two dogs and one cat at home, which she has had for several years.     Main triggers are unknown, exertion makes it worse.  In the last month, frequency of symptoms: daily. Frequency of SABA use: daily. Interference with physical activity: yes. Sleep is disturbed. In the last 12 months, emergency room visits/urgent care visits/doctor office visits or hospitalizations due to respiratory issues: 3-4 times. In the last 12 months, oral steroids courses: 0. Lifetime history of hospitalization for respiratory issues: no. Prior intubations: no. History of pneumonia: no. She was not evaluated by allergist/pulmonologist in the past. Smoking exposure: vapes daily  History of reflux: yes - pantoprazole 40mg  daily.  03/19/2024 CXR: IMPRESSION: No active cardiopulmonary disease.  Assessment and Plan: Holly Wagner is a 33 y.o. female with: Mild persistent asthma without complication Chronic cough Vapes nicotine  containing substance Chronic cough with dark green and white foamy mucus for four months. Multiple antibiotic courses with partial relief. Smoking and vaping may contribute to lung irritation and inflammation. Today's spirometry was normal with minimal improvement  in FEV1 post bronchodilator treatment. Clinically feeling unchanged.  Get chest  X-ray. Discussed smoking/vaping cessation. Only use combivent as needed. Start Alvesco  80mcg 1 puff twice a day with spacer and rinse mouth afterwards. This is a prodrug with minimal systemic side effects. If this is not covered then let me know.  Spacer given and demonstrated proper use with inhaler. Patient understood technique and all questions/concerned were addressed.  May use albuterol  rescue inhaler 2 puffs or nebulizer every 4 to 6 hours as needed for shortness of breath, chest tightness, coughing, and wheezing. May use albuterol  rescue inhaler 2 puffs 5 to 15 minutes prior to strenuous physical activities. Monitor frequency of use - if you need to use it more than twice per week on a consistent basis let us  know.  Discussed potential need for antibiotics if blood work or x-ray indicates infection.  Recurrent infections Frequent antibiotics for respiratory infections.  Keep track of infections and antibiotics use. Get bloodwork next to look at immune system.   Other allergic rhinitis Some drainage. No prior allergy testing. Monitor symptoms. Stop singulair as ineffective.  Use over the counter antihistamines such as Zyrtec (cetirizine), Claritin (loratadine), Allegra (fexofenadine), or Xyzal  (levocetirizine) daily as needed. May take twice a day during allergy flares. May switch antihistamines every few months. Plan on skin testing once breathing is better. Use Atrovent  (ipratropium) 0.03% 1-2 sprays per nostril twice a day as needed for runny nose/drainage. Nasal saline spray (i.e., Simply Saline) or nasal saline lavage (i.e., NeilMed) is recommended as needed and prior to medicated nasal sprays.  Adverse effect of drug, initial encounter Emotional side effects from systemic steroids. Question if ICS caused this as well vs it was her underlying bipolar disorder. Will avoid systemic steroids but willing to try ICS.   GERD GERD managed with pantoprazole 40 mg daily, mostly  effective control with occasional flare-ups. Potential contribution to respiratory symptoms due to lung irritation. See handout for lifestyle and dietary modifications. Continue pantoprazole 40mg  once day - nothing to eat or drink for 20-30 minutes afterwards.   Return in about 4 weeks (around 12/17/2024).  Meds ordered this encounter  Medications   ipratropium (ATROVENT ) 0.03 % nasal spray    Sig: Place 1-2 sprays into both nostrils 2 (two) times daily as needed (nasal drainage).    Dispense:  30 mL    Refill:  5   ciclesonide  (ALVESCO ) 80 MCG/ACT inhaler    Sig: Inhale 1 puff into the lungs 2 (two) times daily. with spacer and rinse mouth afterwards.    Dispense:  1 each    Refill:  3   Lab Orders         CBC with Differential/Platelet         IgG, IgA, IgM         Strep pneumoniae 23 Serotypes IgG         Diphtheria / Tetanus Antibody Panel         Alpha-1-antitrypsin         Complement, total      Other allergy screening: Rhino conjunctivitis: yes Usually worse in the winter. No prior allergy testing. Saw ENT in the past for itch ears.  Food allergy: no Medication allergy: yes Hymenoptera allergy: no Urticaria: no Eczema:yes History of recurrent infections suggestive of immunodeficency: 5-6 courses per year  Diagnostics: Spirometry:  Tracings reviewed. Her effort: Good reproducible efforts. FVC: 3.72L FEV1: 3.15L, 92% predicted FEV1/FVC ratio: 85% Interpretation: Spirometry consistent with normal pattern with minimal improvement  in FEV1 post bronchodilator treatment. Clinically feeling unchanged.   Please see scanned spirometry results for details.  Results discussed with patient/family.   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   Allergic rhinitis 06/12/2023   Asthma 06/12/2023   Bipolar disorder (HCC) 06/12/2023   Eczema 06/12/2023   Migraine without aura and without status migrainosus, not intractable 06/12/2023   Right ovarian cyst 06/12/2023   Mixed stress and urge incontinence 06/12/2023   Morbid obesity (HCC) 06/12/2023   Controlled type 2 diabetes mellitus without complication, without long-term current use of insulin  (HCC) 08/27/2022   Chronic tonsillitis 01/29/2022   Enlarged tonsils 01/29/2022   Throat pain in adult 01/29/2022   Dizziness 06/26/2021   Vertigo 06/26/2021   Tinnitus, bilateral 06/26/2021   Cyclical vomiting syndrome 02/16/2021   Gastroenteritis 02/16/2021   Metabolic acidosis with normal anion gap and bicarbonate losses 02/16/2021   Abnormal involuntary movement 10/13/2020   Tremor of face and hands 10/13/2020   Marijuana use 11/16/2018   Chronic diarrhea 08/14/2018   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   Gastroesophageal reflux disease without esophagitis 08/14/2018   Bipolar disorder in partial remission 01/08/2016   Evaluation regarding contraception options 01/08/2016   Fetal hydronephrosis in pregnancy, antepartum condition 12/28/2015   Group B Streptococcus carrier, +RV culture, currently pregnant 12/24/2015   Gestational diabetes mellitus (GDM) affecting pregnancy 12/21/2015   Irregular contractions 10/31/2015   Indication for care in labor and delivery, antepartum 09/09/2015   Encounter for IUD removal 05/03/2014   Oral contraceptive prescribed 05/03/2014   Skin tag of vaginal mucosa 05/03/2014   Vaginal discharge 05/03/2014   Past Medical History:  Diagnosis Date   ADD (attention deficit disorder)    Anxiety and depression    Asthma    Bipolar 1 disorder (HCC)    Dumping syndrome    Eczema    GERD (gastroesophageal reflux  disease)    Mental disorder    PONV (postoperative nausea and vomiting)    Pre-diabetes    Recurrent upper respiratory infection (URI)    Past Surgical History: Past Surgical History:  Procedure Laterality Date   BILATERAL SALPINGECTOMY Bilateral 04/13/2021   Procedure: BILATERAL SALPINGECTOMY;  Surgeon: Lovetta, Debby PARAS, MD;  Location: [facility] OR[facility];  Service: Gynecology;  Laterality: Bilateral;   CHOLECYSTECTOMY  12/16/2005   fatty tumor removal  12/16/1990   IUD REMOVAL N/A 04/13/2021   Procedure: INTRAUTERINE DEVICE (IUD) REMOVAL;  Surgeon: Schermerhorn, Debby PARAS, MD;  Location: [facility];  Service: Gynecology;  Laterality: N/A;   SINOSCOPY     SINUS SURGERY WITH INSTATRAK     unsure, maybe 4 years ago   VAGINAL HYSTERECTOMY N/A 04/13/2021   Procedure: HYSTERECTOMY VAGINAL;  Surgeon: Schermerhorn, Debby PARAS, MD;  Location: [facility];  Service: Gynecology;  Laterality: N/A;   WISDOM TOOTH EXTRACTION     Medication List:  Current Outpatient Medications  Medication Sig Dispense Refill   albuterol  (PROVENTIL ) (2.5 MG/3ML) 0.083% nebulizer solution Take 2.5 mg by nebulization every 6 (six) hours as needed for wheezing or shortness of breath.     albuterol  (VENTOLIN  HFA) 108 (90 Base) MCG/ACT inhaler Inhale 2 puffs  into the lungs every 6 (six) hours as needed for wheezing or shortness of breath. 8 g 0   atorvastatin  (LIPITOR) 20 MG tablet Take 1 tablet by mouth at bedtime.     Blood Glucose Monitoring Suppl (ACCU-CHEK GUIDE ME) w/Device KIT 2 (two) times daily.     ciclesonide  (ALVESCO ) 80 MCG/ACT inhaler Inhale 1 puff into the lungs 2 (two) times daily. with spacer and rinse mouth afterwards. 1 each 3   DULoxetine (CYMBALTA) 60 MG capsule Take 1 capsule by mouth daily.     fluticasone  (FLONASE ) 50 MCG/ACT nasal spray Place 2 sprays into both nostrils daily. 16 g 0   hydrOXYzine (ATARAX) 25 MG tablet Take 25 mg by mouth.     ipratropium (ATROVENT ) 0.03 % nasal spray Place 1-2 sprays into  both nostrils 2 (two) times daily as needed (nasal drainage). 30 mL 5   Montelukast Sodium (SINGULAIR PO)      pantoprazole (PROTONIX) 40 MG tablet Take 40 mg by mouth daily.     traZODone (DESYREL) 50 MG tablet Take 50 mg by mouth at bedtime.     valsartan (DIOVAN) 80 MG tablet Take 1 tablet by mouth daily.     VRAYLAR 3 MG capsule Take 3 mg by mouth daily.     Accu-Chek Softclix Lancets lancets  (Patient not taking: Reported on 11/19/2024)     Sennosides (SENNA-TIME PO)  (Patient not taking: Reported on 11/19/2024)     Vitamin D, Ergocalciferol, (DRISDOL) 1.25 MG (50000 UNIT) CAPS capsule Take 50,000 Units by mouth once a week. (Patient not taking: Reported on 11/19/2024)     No current facility-administered medications for this visit.   Allergies: Allergies  Allergen Reactions   Morphine  Anxiety and Shortness Of Breath    Pt c/o anxiety attack with the SOB feeling that morphine  gives her. DOESN'T WANT IT  morphine    Morphine  And Codeine Shortness Of Breath and Anxiety   Tizanidine Other (See Comments)    Psychosis  Other Reaction(s): Hallucination, Mental Status Changes, Other (See Comments), Psychosis  Crying, put me in a bad mental place  Crying, put me in a bad mental place    Psychosis  Other Reaction(s): Not available  tizanidine   Buspirone Other (See Comments)    Psychosis episodes  Other Reaction(s): Not available  buspirone  Psychosis episodes     buspirone   Prednisone  Other (See Comments)    Very emotional. Can't stop crying.  Other Reaction(s): Other (See Comments)  Very emotional. Can't stop crying. Reports it mainly happens with the taper down pack, not with low dose steroid use.  Other Reaction(s): Not available  prednisone    Social History: Social History   Socioeconomic History   Marital status: Married    Spouse name: Not on file   Number of children: Not on file   Years of education: Not on file   Highest education level: Not  on file  Occupational History   Not on file  Tobacco Use   Smoking status: Former    Current packs/day: 0.00    Types: Cigarettes    Quit date: 04/30/2015    Years since quitting: 9.5   Smokeless tobacco: Never  Vaping Use   Vaping status: Every Day   Substances: Nicotine , Flavoring  Substance and Sexual Activity   Alcohol use: Yes    Comment: Socially   Drug use: Yes    Types: Marijuana    Comment: occasionally   Sexual activity: Yes    Birth  control/protection: Surgical  Other Topics Concern   Not on file  Social History Narrative   Not on file   Social Drivers of Health   Financial Resource Strain: High Risk (10/23/2022)   Received from [facility]   Overall Financial Resource Strain (CARDIA)    Difficulty of Paying Living Expenses: Very hard  Food Insecurity: Low Risk  (08/25/2023)   Received from [facility]   Hunger Vital Sign    Within the past 12 months, you worried that your food would run out before you got money to buy more: Never true    Within the past 12 months, the food you bought just didn't last and you didn't have money to get more. : Never true  Recent Concern: Food Insecurity - High Risk (06/16/2023)   Received from [facility]   Food vital sign    Within the past 12 months, you worried that your food would run out before you got money to buy more: Often true    Within the past 12 months, the food you bought just didn't last and you didn't have money to get more. : Often true  Transportation Needs: No Transportation Needs (08/25/2023)   Received from [facility]    In the past 12 months, has lack of reliable transportation kept you from medical appointments, meetings, work or from getting things needed for daily living? : No  Recent Concern: Transportation Needs - Unmet Transportation Needs (06/16/2023)   Received from [facility]    In the past 12 months, has lack of reliable transportation kept  you from medical appointments, meetings, work or from getting things needed for daily living? : Yes  Physical Activity: Not on file  Stress: Not on file  Social Connections: Not on file   Lives in a motel. Smoking: vapes Occupation: Veterinary Surgeon HistorySurveyor, Minerals in the house: no Engineer, Civil (consulting) in the family room: no Carpet in the bedroom: no Heating: electric Cooling: central Pet: yes 2 dogs x 12, 4 yrs, 1 cat x 4 yrs  Family History: Family History  Problem Relation Age of Onset   Eczema Mother    Cancer Other    Eczema Son    Eczema Son    Review of Systems  Constitutional:  Negative for appetite change, chills, fever and unexpected weight change.  HENT:  Negative for congestion and rhinorrhea.        Itchy ears  Eyes:  Negative for itching.  Respiratory:  Positive for cough, chest tightness, shortness of breath and wheezing.   Cardiovascular:  Negative for chest pain.  Gastrointestinal:  Positive for abdominal pain, constipation, diarrhea, nausea and vomiting.  Genitourinary:  Negative for difficulty urinating.  Skin:  Negative for rash.    Objective: BP 112/78 (BP Location: Left Arm, Patient Position: Sitting, Cuff Size: Normal)   Pulse 85   Temp 98 F (36.7 C) (Temporal)   Resp 16   Ht 5' 6.75 (1.695 m)   Wt 226 lb 9.6 oz (102.8 kg)   LMP 04/12/2021 (Exact Date)   SpO2 98%   BMI 35.76 kg/m  Body mass index is 35.76 kg/m. Physical Exam Vitals and nursing note reviewed.  Constitutional:      Appearance: Normal appearance. She is well-developed.  HENT:     Head: Normocephalic and atraumatic.     Right Ear: Tympanic membrane and external ear normal.     Left Ear: Tympanic membrane and external ear  normal.     Nose: Nose normal.     Mouth/Throat:     Mouth: Mucous membranes are moist.     Pharynx: Oropharynx is clear.  Eyes:     Conjunctiva/sclera: Conjunctivae normal.  Cardiovascular:     Rate and Rhythm: Normal rate and regular  rhythm.     Heart sounds: Normal heart sounds. No murmur heard.    No friction rub. No gallop.  Pulmonary:     Effort: Pulmonary effort is normal.     Breath sounds: Normal breath sounds. No wheezing, rhonchi or rales.  Musculoskeletal:     Cervical back: Neck supple.  Skin:    General: Skin is warm.     Findings: No rash.  Neurological:     Mental Status: She is alert and oriented to person, place, and time.  Psychiatric:        Behavior: Behavior normal.    The plan was reviewed with the patient/family, and all questions/concerned were addressed.  It was my pleasure to see Holly Wagner today and participate in her care. Please feel free to contact me with any questions or concerns.  Sincerely,  Orlan Cramp, DO Allergy & Immunology  [facility]  [facility] office: [phone] [facility] office: [phone]

## 2024-11-19 NOTE — Patient Instructions (Addendum)
 Breathing Get chest X-ray. Stop smoking/vaping. Only use combivent as needed. Start Alvesco  80mcg 1 puff twice a day with spacer and rinse mouth afterwards. This is a prodrug with minimal systemic side effects. If this is not covered then let me know.  Spacer given and demonstrated proper use with inhaler. Patient understood technique and all questions/concerned were addressed.  If this is not covered let me know.  May use albuterol  rescue inhaler 2 puffs or nebulizer every 4 to 6 hours as needed for shortness of breath, chest tightness, coughing, and wheezing. May use albuterol  rescue inhaler 2 puffs 5 to 15 minutes prior to strenuous physical activities. Monitor frequency of use - if you need to use it more than twice per week on a consistent basis let us  know.   Reflux See handout for lifestyle and dietary modifications. Continue pantoprazole 40mg  once day - nothing to eat or drink for 20-30 minutes afterwards.   Infections Keep track of infections and antibiotics use. Get bloodwork next to look at immune system.   Rhinitis Monitor symptoms. Stop singulair. Use over the counter antihistamines such as Zyrtec (cetirizine), Claritin (loratadine), Allegra (fexofenadine), or Xyzal  (levocetirizine) daily as needed. May take twice a day during allergy flares. May switch antihistamines every few months. Plan on skin testing once breathing is better. Use Atrovent  (ipratropium) 0.03% 1-2 sprays per nostril twice a day as needed for runny nose/drainage. Nasal saline spray (i.e., Simply Saline) or nasal saline lavage (i.e., NeilMed) is recommended as needed and prior to medicated nasal sprays.  Follow up in 4 weeks or sooner if needed.

## 2024-11-26 ENCOUNTER — Ambulatory Visit (INDEPENDENT_AMBULATORY_CARE_PROVIDER_SITE_OTHER): Payer: MEDICAID

## 2024-11-26 ENCOUNTER — Ambulatory Visit (HOSPITAL_COMMUNITY): Payer: Self-pay | Admitting: Mental Health

## 2024-11-26 DIAGNOSIS — F4323 Adjustment disorder with mixed anxiety and depressed mood: Secondary | ICD-10-CM | POA: Insufficient documentation
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
## 2024-12-01 ENCOUNTER — Ambulatory Visit: Payer: MEDICAID | Admitting: Obstetrics and Gynecology

## 2024-12-19 NOTE — Progress Notes (Deleted)
 "  Follow Up Note  RE: Holly Wagner MRN: [id number] DOB: [birth year] Date of Office Visit: 12/20/2024  Referring provider: Jori Small, FNP Primary care provider: Jori Small, FNP  Chief Complaint: No chief complaint on file.  History of Present Illness: I had the pleasure of seeing Holly Wagner for a follow up visit at the [facility] on 12/20/2024. She is a 34 y.o. female, who is being followed for asthma, recurrent infections, allergic rhinitis, adverse drug reaction and GERD. Her previous allergy office visit was on 11/19/2024 with Dr. Luke. Today is a regular follow up visit.  Discussed the use of AI scribe software for clinical note transcription with the patient, who gave verbal consent to proceed.  History of Present Illness            Did she get blood work and chest x-ray done?  Assessment and Plan: [name] is a 34 y.o. female with: Mild persistent asthma without complication Chronic cough Vapes nicotine  containing substance Chronic cough with dark green and white foamy mucus for four months. Multiple antibiotic courses with partial relief. Smoking and vaping may contribute to lung irritation and inflammation. Today's spirometry was normal with minimal improvement in FEV1 post bronchodilator treatment. Clinically feeling unchanged.  Get chest X-ray. Discussed smoking/vaping cessation. Only use combivent as needed. Start Alvesco  80mcg 1 puff twice a day with spacer and rinse mouth afterwards. This is a prodrug with minimal systemic side effects. If this is not covered then let me know.  Spacer given and demonstrated proper use with inhaler. Patient understood technique and all questions/concerned were addressed.  May use albuterol  rescue inhaler 2 puffs or nebulizer every 4 to 6 hours as needed for shortness of breath, chest tightness, coughing, and wheezing. May use albuterol  rescue inhaler 2 puffs 5 to 15 minutes prior to strenuous  physical activities. Monitor frequency of use - if you need to use it more than twice per week on a consistent basis let us  know.  Discussed potential need for antibiotics if blood work or x-ray indicates infection.   Recurrent infections Frequent antibiotics for respiratory infections.  Keep track of infections and antibiotics use. Get bloodwork next to look at immune system.    Other allergic rhinitis Some drainage. No prior allergy testing. Monitor symptoms. Stop singulair as ineffective.  Use over the counter antihistamines such as Zyrtec (cetirizine), Claritin (loratadine), Allegra (fexofenadine), or Xyzal  (levocetirizine) daily as needed. May take twice a day during allergy flares. May switch antihistamines every few months. Plan on skin testing once breathing is better. Use Atrovent  (ipratropium) 0.03% 1-2 sprays per nostril twice a day as needed for runny nose/drainage. Nasal saline spray (i.e., Simply Saline) or nasal saline lavage (i.e., NeilMed) is recommended as needed and prior to medicated nasal sprays.   Adverse effect of drug, initial encounter Emotional side effects from systemic steroids. Question if ICS caused this as well vs it was her underlying bipolar disorder. Will avoid systemic steroids but willing to try ICS.    GERD GERD managed with pantoprazole 40 mg daily, mostly effective control with occasional flare-ups. Potential contribution to respiratory symptoms due to lung irritation. See handout for lifestyle and dietary modifications. Continue pantoprazole 40mg  once day - nothing to eat or drink for 20-30 minutes afterwards.  Assessment and Plan              No follow-ups on file.  No orders of the defined types were placed in this encounter.  Lab  Orders  No laboratory test(s) ordered today    Diagnostics: Spirometry:  Tracings reviewed. Her effort: {Blank single:19197::Good reproducible efforts.,It was hard to get consistent efforts and there  is a question as to whether this reflects a maximal maneuver.,Poor effort, data can not be interpreted.} FVC: ***L FEV1: ***L, ***% predicted FEV1/FVC ratio: ***% Interpretation: {Blank single:19197::Spirometry consistent with mild obstructive disease,Spirometry consistent with moderate obstructive disease,Spirometry consistent with severe obstructive disease,Spirometry consistent with possible restrictive disease,Spirometry consistent with mixed obstructive and restrictive disease,Spirometry uninterpretable due to technique,Spirometry consistent with normal pattern,No overt abnormalities noted given today's efforts}.  Please see scanned spirometry results for details.  Skin Testing: {Blank single:19197::Select foods,Environmental allergy panel,Environmental allergy panel and select foods,Food allergy panel,None,Deferred due to recent antihistamines use}. *** Results discussed with patient/family.   Medication List:  Current Outpatient Medications  Medication Sig Dispense Refill   Accu-Chek Softclix Lancets lancets  (Patient not taking: Reported on 11/19/2024)     albuterol  (PROVENTIL ) (2.5 MG/3ML) 0.083% nebulizer solution Take 2.5 mg by nebulization every 6 (six) hours as needed for wheezing or shortness of breath.     albuterol  (VENTOLIN  HFA) 108 (90 Base) MCG/ACT inhaler Inhale 2 puffs into the lungs every 6 (six) hours as needed for wheezing or shortness of breath. 8 g 0   atorvastatin  (LIPITOR) 20 MG tablet Take 1 tablet by mouth at bedtime.     Blood Glucose Monitoring Suppl (ACCU-CHEK GUIDE ME) w/Device KIT 2 (two) times daily.     ciclesonide  (ALVESCO ) 80 MCG/ACT inhaler Inhale 1 puff into the lungs 2 (two) times daily. with spacer and rinse mouth afterwards. 1 each 3   DULoxetine (CYMBALTA) 60 MG capsule Take 1 capsule by mouth daily.     fluticasone  (FLONASE ) 50 MCG/ACT nasal spray Place 2 sprays into both nostrils daily. 16 g 0   hydrOXYzine (ATARAX)  25 MG tablet Take 25 mg by mouth.     ipratropium (ATROVENT ) 0.03 % nasal spray Place 1-2 sprays into both nostrils 2 (two) times daily as needed (nasal drainage). 30 mL 5   Montelukast Sodium (SINGULAIR PO)      pantoprazole (PROTONIX) 40 MG tablet Take 40 mg by mouth daily.     Sennosides (SENNA-TIME PO)  (Patient not taking: Reported on 11/19/2024)     traZODone (DESYREL) 50 MG tablet Take 50 mg by mouth at bedtime.     valsartan (DIOVAN) 80 MG tablet Take 1 tablet by mouth daily.     Vitamin D, Ergocalciferol, (DRISDOL) 1.25 MG (50000 UNIT) CAPS capsule Take 50,000 Units by mouth once a week. (Patient not taking: Reported on 11/19/2024)     VRAYLAR 3 MG capsule Take 3 mg by mouth daily.     No current facility-administered medications for this visit.   Allergies: Allergies[1] I reviewed her past medical history, social history, family history, and environmental history and no significant changes have been reported from her previous visit.  Review of Systems  Constitutional:  Negative for appetite change, chills, fever and unexpected weight change.  HENT:  Negative for congestion and rhinorrhea.        Itchy ears  Eyes:  Negative for itching.  Respiratory:  Positive for cough, chest tightness, shortness of breath and wheezing.   Cardiovascular:  Negative for chest pain.  Gastrointestinal:  Positive for abdominal pain, constipation, diarrhea, nausea and vomiting.  Genitourinary:  Negative for difficulty urinating.  Skin:  Negative for rash.    Objective: LMP 01/30/2021  There is no height or weight on  file to calculate BMI. Physical Exam Vitals and nursing note reviewed.  Constitutional:      Appearance: Normal appearance. She is well-developed.  HENT:     Head: Normocephalic and atraumatic.     Right Ear: Tympanic membrane and external ear normal.     Left Ear: Tympanic membrane and external ear normal.     Nose: Nose normal.     Mouth/Throat:     Mouth: Mucous membranes are  moist.     Pharynx: Oropharynx is clear.  Eyes:     Conjunctiva/sclera: Conjunctivae normal.  Cardiovascular:     Rate and Rhythm: Normal rate and regular rhythm.     Heart sounds: Normal heart sounds. No murmur heard.    No friction rub. No gallop.  Pulmonary:     Effort: Pulmonary effort is normal.     Breath sounds: Normal breath sounds. No wheezing, rhonchi or rales.  Musculoskeletal:     Cervical back: Neck supple.  Skin:    General: Skin is warm.     Findings: No rash.  Neurological:     Mental Status: She is alert and oriented to person, place, and time.  Psychiatric:        Behavior: Behavior normal.    Previous notes and tests were reviewed. The plan was reviewed with the patient/family, and all questions/concerned were addressed.  It was my pleasure to see Holly Wagner today and participate in her care. Please feel free to contact me with any questions or concerns.  Sincerely,  Orlan Cramp, DO Allergy & Immunology  [facility]  [facility] office: [phone] [facility] office: [phone]    [1]  Allergies Allergen Reactions   Morphine  Anxiety and Shortness Of Breath    Pt c/o anxiety attack with the SOB feeling that morphine  gives her. DOESN'T WANT IT  morphine    Morphine  And Codeine Shortness Of Breath and Anxiety   Tizanidine Other (See Comments)    Psychosis  Other Reaction(s): Hallucination, Mental Status Changes, Other (See Comments), Psychosis  Crying, put me in a bad mental place  Crying, put me in a bad mental place    Psychosis  Other Reaction(s): Not available  tizanidine   Buspirone Other (See Comments)    Psychosis episodes  Other Reaction(s): Not available  buspirone  Psychosis episodes     buspirone   Prednisone  Other (See Comments)    Very emotional. Can't stop crying.  Other Reaction(s): Other (See Comments)  Very emotional. Can't stop crying. Reports it mainly happens with the taper  down pack, not with low dose steroid use.  Other Reaction(s): Not available  prednisone    "

## 2024-12-20 ENCOUNTER — Ambulatory Visit: Payer: MEDICAID | Admitting: Allergy

## 2024-12-20 DIAGNOSIS — Z720 Tobacco use: Secondary | ICD-10-CM

## 2024-12-20 DIAGNOSIS — T50905D Adverse effect of unspecified drugs, medicaments and biological substances, subsequent encounter: Secondary | ICD-10-CM

## 2024-12-20 DIAGNOSIS — R053 Chronic cough: Secondary | ICD-10-CM

## 2024-12-20 DIAGNOSIS — J4530 Mild persistent asthma, uncomplicated: Secondary | ICD-10-CM

## 2024-12-20 DIAGNOSIS — J3089 Other allergic rhinitis: Secondary | ICD-10-CM

## 2024-12-20 DIAGNOSIS — K219 Gastro-esophageal reflux disease without esophagitis: Secondary | ICD-10-CM

## 2024-12-20 DIAGNOSIS — B999 Unspecified infectious disease: Secondary | ICD-10-CM

## 2024-12-21 ENCOUNTER — Encounter (HOSPITAL_COMMUNITY): Payer: Self-pay

## 2024-12-21 ENCOUNTER — Telehealth (HOSPITAL_COMMUNITY): Payer: Self-pay

## 2024-12-21 ENCOUNTER — Ambulatory Visit (HOSPITAL_COMMUNITY): Payer: MEDICAID

## 2024-12-21 NOTE — Telephone Encounter (Signed)
 Patient called and left voice mail at 9:01 that she was sick and would not be joining call.

## 2024-12-21 NOTE — Telephone Encounter (Signed)
 Sent text at 10:02am and 10:06am. No show

## 2025-01-04 ENCOUNTER — Encounter (HOSPITAL_COMMUNITY): Payer: Self-pay

## 2025-01-04 ENCOUNTER — Ambulatory Visit (INDEPENDENT_AMBULATORY_CARE_PROVIDER_SITE_OTHER): Payer: MEDICAID

## 2025-01-04 DIAGNOSIS — F4323 Adjustment disorder with mixed anxiety and depressed mood: Secondary | ICD-10-CM

## 2025-01-04 NOTE — Progress Notes (Signed)
 "  Holly Wagner PROGRESS NOTE  Virtual Visit via Video Note  I connected with Holly Wagner on 01/04/25 at  2:00 PM EST by a video enabled telemedicine application and verified that I am speaking with the correct person using two identifiers.  Location: Patient: Home Address Provider: Clinician Home Office   I discussed the limitations of evaluation and management by telemedicine and the availability of in person appointments. The patient expressed understanding and agreed to proceed.    I discussed the assessment and treatment plan with the patient. The patient was provided an opportunity to ask questions and all were answered. The patient agreed with the plan and demonstrated an understanding of the instructions.   The patient was advised to call back or seek an in-person evaluation if the symptoms worsen or if the condition fails to improve as anticipated.  I provided 43 minutes of non-face-to-face time during this encounter.   Holly Wagner   Session Time: 2:05- 2:48pm  Participation Level: Active  Behavioral Response: CasualAlertEuthymic  Type of Therapy: Individual Therapy  Treatment Goals addressed:    LTG: Holly Wagner will score less than 5 on the Patient Health Questionnaire (PHQ-9)  Priority: Expected end: STGBETHA Holly will attend at least 80% of scheduled group psychotherapy sessions   Interventions Work with Holly to track symptoms, triggers, and/or skill use through a mood chart, diary card, or journal Holly Wagner will identify 3 personal goals for managing depression symptoms to work on during the current treatment episode Holly Wagner will identify 3 trauma related cognitive distortions Create a weekly activity schedule  ProgressTowards Goals: Initial  Interventions: CBT and Supportive  Summary: Holly Wagner is a 34 year old single female that presented today with diagnoses of Adjustment disorder with mixed anxiety and depressed mood [F43.23.  Holly Wagner  reports feeling a difference in herself without the medication. Holly Wagner reports feeling anxious and crying a lot. Holly Wagner has no relationship with biological family and some of husbands family. Holly Wagner acknowledges still grieving over the lost of a friendship with her in-laws.  Holly Wagner reports wanting to work-through her friend letting her down. However, she feels supported by her immediate family (husband and children).   Suicidal/Homicidal: None; without intent or plan.   Holly Wagner Response: Clinician met with Holly today for virtual therapy appointment and assessed for safety, medication compliance, and sobriety. Holly Wagner presented for session on time and was alert, oriented x5, with no evidence or self-report of active SI/HI. Holly Wagner denied any abuse of alcohol or illicit substances. Holly Wagner reported having problems receiving her medication because of a confusion at the pharmacy. Clinician inquired about Holly Wagner current emotional ratings, as well as any significant changes in thoughts, feelings or behavior since previous check-in. Holly current symptoms have included fatigue, irritability, trouble sleeping,increased anger, and tearfulness, with updated PHQ9 screening today rated 7.  Holly Wagner reported ongoing issues with anxiety such as difficulty concentrating, irritability, restlessness, lack of sleep, fatigue, and tension, rating a 7 on GAD7 screening.   Holly Wagner acknowledges that Holly Wagner is grieving from the loss of a friendship with her sister-in-law.   Plan: Return again in 2 weeks.  Diagnosis: Adjustment disorder with mixed anxiety and depressed mood [F43.23   Collaboration of Care:   Patient/Guardian was advised Release of Information must be obtained prior to any record release in order to collaborate their care with an outside provider. Patient/Guardian was advised if they have not already done so to contact the registration department to sign all necessary forms in order for  us  to  release information regarding their care.   Consent: Patient/Guardian gives verbal consent for treatment and assignment of benefits for services provided during this visit. Patient/Guardian expressed understanding and agreed to proceed.   Holly Wagner 01/04/2025  "

## 2025-01-11 NOTE — Progress Notes (Unsigned)
 "  Office Visit Note  Patient: Holly Wagner             Date of Birth: Jan 24, 1991           MRN: [id number]             PCP: Jori Small, FNP Referring: Jori Small, FNP Visit Date: 01/12/2025 Occupation: Data Unavailable  Subjective:    History of Present Illness: Holly Wagner is a 34 y.o. female who presents today for a new patient consultation.       Activities of Daily Living:  Patient reports morning stiffness for *** {minute/hour:19697}.   Patient {ACTIONS;DENIES/REPORTS:21021675::Denies} nocturnal pain.  Difficulty dressing/grooming: {ACTIONS;DENIES/REPORTS:21021675::Denies} Difficulty climbing stairs: {ACTIONS;DENIES/REPORTS:21021675::Denies} Difficulty getting out of chair: {ACTIONS;DENIES/REPORTS:21021675::Denies} Difficulty using hands for taps, buttons, cutlery, and/or writing: {ACTIONS;DENIES/REPORTS:21021675::Denies}  No Rheumatology ROS completed.   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   Allergic rhinitis 06/12/2023   Asthma 06/12/2023   Bipolar disorder (HCC) 06/12/2023   Eczema 06/12/2023   Migraine without aura and without status migrainosus, not intractable 06/12/2023   Right ovarian cyst 06/12/2023   Mixed stress and urge incontinence 06/12/2023   Morbid obesity (HCC) 06/12/2023   Controlled type 2 diabetes mellitus without complication, without long-term current use of insulin  (HCC) 08/27/2022   Chronic tonsillitis 01/29/2022   Enlarged tonsils 01/29/2022   Throat pain in adult 01/29/2022   Dizziness  06/26/2021   Vertigo 06/26/2021   Tinnitus, bilateral 06/26/2021   Cyclical vomiting syndrome 02/16/2021   Gastroenteritis 02/16/2021   Metabolic acidosis with normal anion gap and bicarbonate losses 02/16/2021   Abnormal involuntary movement 10/13/2020   Tremor of face and hands 10/13/2020   Marijuana use 11/16/2018   Chronic diarrhea 08/14/2018   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   Gastroesophageal reflux disease without esophagitis 08/14/2018   Bipolar disorder in partial remission 01/08/2016   Evaluation regarding contraception options 01/08/2016   Fetal hydronephrosis in pregnancy, antepartum condition 12/28/2015   Group B Streptococcus carrier, +RV culture, currently pregnant 12/24/2015   Gestational diabetes mellitus (GDM) affecting pregnancy 12/21/2015   Irregular contractions 10/31/2015   Indication for care in labor and delivery, antepartum 09/09/2015   Encounter for IUD removal 05/03/2014   Oral contraceptive prescribed 05/03/2014   Skin tag of vaginal mucosa 05/03/2014   Vaginal discharge 05/03/2014    Past Medical History:  Diagnosis Date   ADD (attention deficit disorder)    Anxiety and depression    Asthma    Bipolar 1 disorder (HCC)    Dumping syndrome    Eczema    GERD (gastroesophageal reflux disease)    Mental disorder    PONV (postoperative nausea and vomiting)    Pre-diabetes    Recurrent upper respiratory infection (URI)     Family History  Problem Relation Age of Onset   Eczema Mother    Cancer Other    Eczema Son    Eczema Son    Past Surgical History:  Procedure Laterality Date   BILATERAL SALPINGECTOMY Bilateral 04/13/2021   Procedure: BILATERAL SALPINGECTOMY;  Surgeon: Lovetta, Debby PARAS, MD;  Location: [facility];  Service: Gynecology;  Laterality: Bilateral;   CHOLECYSTECTOMY  12/16/2005   fatty tumor removal  12/16/1990   IUD REMOVAL N/A 04/13/2021   Procedure: INTRAUTERINE DEVICE (IUD) REMOVAL;  Surgeon:  Schermerhorn, Debby PARAS, MD;  Location: [facility];  Service: Gynecology;  Laterality: N/A;   SINOSCOPY     SINUS SURGERY WITH INSTATRAK     unsure, maybe 4 years ago   VAGINAL HYSTERECTOMY N/A 04/13/2021   Procedure: HYSTERECTOMY VAGINAL;  Surgeon: Schermerhorn, Debby PARAS, MD;  Location: [facility];  Service: Gynecology;  Laterality: N/A;   WISDOM TOOTH EXTRACTION     Social History[1] Social History   Social History Narrative   Not on file     Immunization History  Administered Date(s) Administered   PFIZER Comirnaty(Gray Top)Covid-19 Tri-Sucrose Vaccine 03/08/2021     Objective: Vital Signs: LMP 01/30/2021    Physical Exam Vitals and nursing note reviewed.  Constitutional:      Appearance: She is well-developed.  HENT:     Head: Normocephalic and atraumatic.  Eyes:     Conjunctiva/sclera: Conjunctivae normal.  Cardiovascular:     Rate and Rhythm: Normal rate and regular rhythm.     Heart sounds: Normal heart sounds.  Pulmonary:     Effort: Pulmonary effort is normal.     Breath sounds: Normal breath sounds.  Abdominal:     General: Bowel sounds are normal.     Palpations: Abdomen is soft.  Musculoskeletal:     Cervical back: Normal range of motion.  Lymphadenopathy:     Cervical: No cervical adenopathy.  Skin:    General: Skin is warm and dry.     Capillary Refill: Capillary refill takes less than 2 seconds.  Neurological:     Mental Status: She is alert and oriented to person, place, and time.  Psychiatric:        Behavior: Behavior normal.      Musculoskeletal Exam: ***  CDAI Exam: CDAI Score: -- Patient Global: --; Provider Global: -- Swollen: --; Tender: -- Joint Exam 01/12/2025   No joint exam has been documented for this visit   There is currently no information documented on the homunculus. Go to the Rheumatology activity and complete the homunculus joint exam.  Investigation: No additional findings.  Imaging: No results found.  Recent  Labs: Lab Results  Component Value Date   WBC 7.7 04/04/2024   HGB 11.4 (L) 04/04/2024   PLT 296 04/04/2024   NA 138 04/04/2024   K 3.7 04/04/2024   CL 102 04/04/2024   CO2 31 04/04/2024   GLUCOSE 131 (H) 04/04/2024   BUN 10 04/04/2024   CREATININE 0.99 04/04/2024   BILITOT 0.4 04/04/2024   ALKPHOS 55 04/04/2024   AST 23 04/04/2024   ALT 21 04/04/2024   PROT 6.7 04/04/2024   ALBUMIN 4.1 04/04/2024   CALCIUM  8.8 (L) 04/04/2024   GFRAA >60 08/07/2018    Speciality Comments: No specialty comments available.  Procedures:  No procedures performed Allergies: Morphine , Morphine  and codeine, Tizanidine, Buspirone, and Prednisone    Assessment / Plan:     Visit Diagnoses: Positive ANA (antinuclear antibody) - 05/07/24: ANA 1:80 Speckled  Migraine without aura and without status migrainosus, not intractable  Essential hypertension  Irritable bowel syndrome with diarrhea  Gastroesophageal reflux disease without esophagitis  Controlled type 2 diabetes mellitus without complication, without long-term current use of insulin  (HCC)  Mixed stress and urge incontinence  Metabolic acidosis with normal anion gap and bicarbonate losses  IUD (intrauterine device) in place  History of bipolar disorder  Diverticulitis large intestine w/o perforation or abscess w/o bleeding  Orders: No orders of the defined types were placed in this encounter.  No orders of the defined types were placed in this encounter.   Face-to-face time spent with patient was *** minutes. Greater than 50% of time was spent in counseling and coordination of care.  Follow-Up Instructions: No follow-ups on file.   Waddell CHRISTELLA Craze, PA-C  Note - This record has been created using Dragon software.  Chart creation errors have been sought, but may not always  have been located. Such creation errors do not reflect on  the standard of medical care.     [1]  Social History Tobacco Use   Smoking status: Former     Current packs/day: 0.00    Average packs/day: 0.5 packs/day    Types: Cigarettes    Quit date: 04/30/2015    Years since quitting: 9.7   Smokeless tobacco: Never  Vaping Use   Vaping status: Every Day   Substances: Nicotine , Flavoring  Substance Use Topics   Alcohol use: Yes    Comment: Socially   Drug use: Yes    Types: Marijuana    Comment: occasionally   "

## 2025-01-12 ENCOUNTER — Ambulatory Visit: Payer: MEDICAID | Admitting: Physician Assistant

## 2025-01-18 ENCOUNTER — Ambulatory Visit (HOSPITAL_COMMUNITY): Payer: MEDICAID

## 2025-01-20 ENCOUNTER — Ambulatory Visit (INDEPENDENT_AMBULATORY_CARE_PROVIDER_SITE_OTHER): Payer: MEDICAID

## 2025-01-20 DIAGNOSIS — F4323 Adjustment disorder with mixed anxiety and depressed mood: Secondary | ICD-10-CM
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
## 2025-01-25 ENCOUNTER — Ambulatory Visit: Payer: MEDICAID | Admitting: Obstetrics and Gynecology

## 2025-02-08 ENCOUNTER — Ambulatory Visit (HOSPITAL_COMMUNITY): Payer: MEDICAID

## 2025-03-04 ENCOUNTER — Ambulatory Visit: Payer: MEDICAID | Admitting: Obstetrics

## 2025-03-08 ENCOUNTER — Ambulatory Visit: Payer: MEDICAID | Admitting: Physician Assistant
